# Patient Record
Sex: Female | Born: 1970 | Race: White | Hispanic: No | Marital: Married | Smoking: Former smoker
Health system: Southern US, Community
[De-identification: ages and names within clinical notes are randomized; demographics above are authoritative.]

## PROBLEM LIST (undated history)

## (undated) DIAGNOSIS — F419 Anxiety disorder, unspecified: Secondary | ICD-10-CM

## (undated) DIAGNOSIS — M810 Age-related osteoporosis without current pathological fracture: Secondary | ICD-10-CM

## (undated) DIAGNOSIS — F32A Depression, unspecified: Secondary | ICD-10-CM

## (undated) DIAGNOSIS — M1990 Unspecified osteoarthritis, unspecified site: Secondary | ICD-10-CM

## (undated) DIAGNOSIS — T7840XA Allergy, unspecified, initial encounter: Secondary | ICD-10-CM

## (undated) DIAGNOSIS — N809 Endometriosis, unspecified: Secondary | ICD-10-CM

## (undated) DIAGNOSIS — E079 Disorder of thyroid, unspecified: Secondary | ICD-10-CM

## (undated) DIAGNOSIS — D649 Anemia, unspecified: Secondary | ICD-10-CM

## (undated) HISTORY — DX: Age-related osteoporosis without current pathological fracture: M81.0

## (undated) HISTORY — DX: Allergy, unspecified, initial encounter: T78.40XA

## (undated) HISTORY — DX: Anxiety disorder, unspecified: F41.9

## (undated) HISTORY — DX: Anemia, unspecified: D64.9

## (undated) HISTORY — PX: TUBAL LIGATION: SHX77

## (undated) HISTORY — DX: Depression, unspecified: F32.A

## (undated) HISTORY — DX: Unspecified osteoarthritis, unspecified site: M19.90

## (undated) HISTORY — PX: LAPAROSCOPIC OOPHERECTOMY: SHX6507

## (undated) HISTORY — DX: Disorder of thyroid, unspecified: E07.9

---

## 1999-02-10 ENCOUNTER — Ambulatory Visit (HOSPITAL_COMMUNITY): Admission: RE | Admit: 1999-02-10 | Discharge: 1999-02-10 | Payer: Self-pay | Admitting: Obstetrics and Gynecology

## 1999-03-18 ENCOUNTER — Other Ambulatory Visit: Admission: RE | Admit: 1999-03-18 | Discharge: 1999-03-18 | Payer: Self-pay | Admitting: Obstetrics and Gynecology

## 2000-03-23 ENCOUNTER — Other Ambulatory Visit: Admission: RE | Admit: 2000-03-23 | Discharge: 2000-03-23 | Payer: Self-pay | Admitting: Obstetrics and Gynecology

## 2001-04-20 ENCOUNTER — Other Ambulatory Visit: Admission: RE | Admit: 2001-04-20 | Discharge: 2001-04-20 | Payer: Self-pay | Admitting: Obstetrics and Gynecology

## 2002-03-15 ENCOUNTER — Other Ambulatory Visit: Admission: RE | Admit: 2002-03-15 | Discharge: 2002-03-15 | Payer: Self-pay | Admitting: Gynecology

## 2002-04-27 ENCOUNTER — Inpatient Hospital Stay (HOSPITAL_COMMUNITY): Admission: AD | Admit: 2002-04-27 | Discharge: 2002-04-27 | Payer: Self-pay | Admitting: Gynecology

## 2003-03-08 ENCOUNTER — Other Ambulatory Visit: Admission: RE | Admit: 2003-03-08 | Discharge: 2003-03-08 | Payer: Self-pay | Admitting: Gynecology

## 2003-09-04 ENCOUNTER — Other Ambulatory Visit: Admission: RE | Admit: 2003-09-04 | Discharge: 2003-09-04 | Payer: Self-pay | Admitting: Obstetrics and Gynecology

## 2003-10-04 ENCOUNTER — Inpatient Hospital Stay (HOSPITAL_COMMUNITY): Admission: AD | Admit: 2003-10-04 | Discharge: 2003-10-04 | Payer: Self-pay | Admitting: Obstetrics and Gynecology

## 2003-12-20 ENCOUNTER — Ambulatory Visit (HOSPITAL_COMMUNITY): Admission: RE | Admit: 2003-12-20 | Discharge: 2003-12-20 | Payer: Self-pay | Admitting: Obstetrics and Gynecology

## 2004-03-27 ENCOUNTER — Inpatient Hospital Stay (HOSPITAL_COMMUNITY): Admission: AD | Admit: 2004-03-27 | Discharge: 2004-03-30 | Payer: Self-pay | Admitting: Obstetrics and Gynecology

## 2004-05-02 ENCOUNTER — Other Ambulatory Visit: Admission: RE | Admit: 2004-05-02 | Discharge: 2004-05-02 | Payer: Self-pay | Admitting: Obstetrics and Gynecology

## 2005-05-18 ENCOUNTER — Ambulatory Visit: Payer: Self-pay | Admitting: Internal Medicine

## 2005-06-23 ENCOUNTER — Other Ambulatory Visit: Admission: RE | Admit: 2005-06-23 | Discharge: 2005-06-23 | Payer: Self-pay | Admitting: Obstetrics and Gynecology

## 2007-06-16 ENCOUNTER — Encounter (INDEPENDENT_AMBULATORY_CARE_PROVIDER_SITE_OTHER): Payer: Self-pay

## 2007-06-20 DIAGNOSIS — F3289 Other specified depressive episodes: Secondary | ICD-10-CM | POA: Insufficient documentation

## 2007-06-20 DIAGNOSIS — F329 Major depressive disorder, single episode, unspecified: Secondary | ICD-10-CM

## 2007-06-20 DIAGNOSIS — G4486 Cervicogenic headache: Secondary | ICD-10-CM | POA: Insufficient documentation

## 2007-06-20 DIAGNOSIS — R51 Headache: Secondary | ICD-10-CM

## 2008-01-31 ENCOUNTER — Ambulatory Visit: Payer: Self-pay | Admitting: Internal Medicine

## 2008-01-31 DIAGNOSIS — S9030XA Contusion of unspecified foot, initial encounter: Secondary | ICD-10-CM | POA: Insufficient documentation

## 2008-08-27 ENCOUNTER — Ambulatory Visit: Payer: Self-pay | Admitting: Internal Medicine

## 2008-08-27 DIAGNOSIS — J069 Acute upper respiratory infection, unspecified: Secondary | ICD-10-CM | POA: Insufficient documentation

## 2008-09-18 ENCOUNTER — Telehealth: Payer: Self-pay | Admitting: Internal Medicine

## 2008-09-18 ENCOUNTER — Ambulatory Visit: Payer: Self-pay | Admitting: Internal Medicine

## 2008-09-25 ENCOUNTER — Telehealth: Payer: Self-pay | Admitting: Internal Medicine

## 2008-10-09 ENCOUNTER — Telehealth: Payer: Self-pay | Admitting: Internal Medicine

## 2010-09-01 ENCOUNTER — Encounter: Admission: RE | Admit: 2010-09-01 | Discharge: 2010-09-01 | Payer: Self-pay | Admitting: Obstetrics and Gynecology

## 2011-11-13 ENCOUNTER — Other Ambulatory Visit: Payer: Self-pay | Admitting: Obstetrics and Gynecology

## 2011-11-13 DIAGNOSIS — R928 Other abnormal and inconclusive findings on diagnostic imaging of breast: Secondary | ICD-10-CM

## 2011-11-24 ENCOUNTER — Encounter: Payer: Self-pay | Admitting: Internal Medicine

## 2011-11-24 ENCOUNTER — Ambulatory Visit (INDEPENDENT_AMBULATORY_CARE_PROVIDER_SITE_OTHER): Payer: BC Managed Care – PPO | Admitting: Internal Medicine

## 2011-11-24 VITALS — BP 120/80 | Temp 98.2°F | Ht 64.0 in | Wt 157.0 lb

## 2011-11-24 DIAGNOSIS — M7550 Bursitis of unspecified shoulder: Secondary | ICD-10-CM

## 2011-11-24 DIAGNOSIS — M67919 Unspecified disorder of synovium and tendon, unspecified shoulder: Secondary | ICD-10-CM

## 2011-11-24 NOTE — Progress Notes (Signed)
  Subjective:    Patient ID: Carrie [name], female    DOB: 1971/08/18, 41 y.o.   MRN: [id number]  HPI  41 year old patient who presents with a chief complaint of right shoulder pain. This has been present intermittently for approximately 5 months.  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 She has had pain in the right anterolateral shoulder region. Range of motion is fairly well maintained but she has some discomfort with movement.  Review of Systems  Musculoskeletal: Arthralgias: right shoulder pain.       Objective:   Physical Exam  Constitutional: She appears well-developed and well-nourished. No distress.  Musculoskeletal: Normal range of motion. She exhibits tenderness. She exhibits no edema.       Tenderness of the right anterolateral shoulder region. Range of motion intact          Assessment & Plan:    Chronic right shoulder bursitis.  The right shoulder bursa was injected with 1 cc of Xylocaine and 1 cc of Depo-Medrol after prep

## 2011-11-24 NOTE — Patient Instructions (Signed)
Take Aleve 200 mg  TWO  twice daily for pain or swelling  Call or return to clinic prn if these symptoms worsen or fail to improve as anticipated.  You  may move around, but avoid painful motions and activities.  Apply heat  to the sore area for 15 to 20 minutes 3 or 4 times daily for the next two to 3 days.

## 2011-11-25 ENCOUNTER — Ambulatory Visit
Admission: RE | Admit: 2011-11-25 | Discharge: 2011-11-25 | Disposition: A | Discharge status: Home / Self Care | Payer: BC Managed Care – PPO | Source: Ambulatory Visit | Attending: Obstetrics and Gynecology | Admitting: Obstetrics and Gynecology

## 2011-11-25 DIAGNOSIS — R928 Other abnormal and inconclusive findings on diagnostic imaging of breast: Secondary | ICD-10-CM

## 2012-09-14 ENCOUNTER — Other Ambulatory Visit: Payer: Self-pay | Admitting: Obstetrics and Gynecology

## 2012-09-14 DIAGNOSIS — D249 Benign neoplasm of unspecified breast: Secondary | ICD-10-CM

## 2012-09-19 ENCOUNTER — Ambulatory Visit
Admission: RE | Admit: 2012-09-19 | Discharge: 2012-09-19 | Disposition: A | Discharge status: Home / Self Care | Payer: BC Managed Care – PPO | Source: Ambulatory Visit | Attending: Obstetrics and Gynecology | Admitting: Obstetrics and Gynecology

## 2012-09-19 ENCOUNTER — Other Ambulatory Visit: Payer: Self-pay | Admitting: Obstetrics and Gynecology

## 2012-09-19 DIAGNOSIS — N63 Unspecified lump in unspecified breast: Secondary | ICD-10-CM

## 2012-09-19 DIAGNOSIS — D249 Benign neoplasm of unspecified breast: Secondary | ICD-10-CM

## 2012-11-07 ENCOUNTER — Ambulatory Visit
Admission: RE | Admit: 2012-11-07 | Discharge: 2012-11-07 | Disposition: A | Discharge status: Home / Self Care | Payer: BC Managed Care – PPO | Source: Ambulatory Visit | Attending: Obstetrics and Gynecology | Admitting: Obstetrics and Gynecology

## 2012-11-07 DIAGNOSIS — N63 Unspecified lump in unspecified breast: Secondary | ICD-10-CM

## 2012-11-10 ENCOUNTER — Other Ambulatory Visit: Payer: Self-pay | Admitting: Obstetrics and Gynecology

## 2012-11-10 DIAGNOSIS — R928 Other abnormal and inconclusive findings on diagnostic imaging of breast: Secondary | ICD-10-CM

## 2012-11-16 ENCOUNTER — Ambulatory Visit (INDEPENDENT_AMBULATORY_CARE_PROVIDER_SITE_OTHER): Payer: BC Managed Care – PPO | Admitting: Family Medicine

## 2012-11-16 ENCOUNTER — Encounter: Payer: Self-pay | Admitting: Family Medicine

## 2012-11-16 VITALS — BP 110/80 | Temp 98.7°F | Wt 161.0 lb

## 2012-11-16 DIAGNOSIS — B9789 Other viral agents as the cause of diseases classified elsewhere: Secondary | ICD-10-CM

## 2012-11-16 DIAGNOSIS — B349 Viral infection, unspecified: Secondary | ICD-10-CM

## 2012-11-16 NOTE — Progress Notes (Signed)
  Subjective:    Patient ID: Carrie [name], female    DOB: 02/13/1971, 42 y.o.   MRN: [id number]  HPI Acute visit  Onset Sunday of cough. By Monday had increased fatigue and diffuse body aches and temperature 100.2 Fever only lasted one day. Denies sore throat or nasal congestion Cough is dry. Occasional diffuse headaches. 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 No distress.  HENT:  Right Ear: External ear normal.  Left Ear: External ear normal.  Mouth/Throat: Oropharynx is clear and moist.  Neck: Neck supple. No thyromegaly present.  Cardiovascular: Normal rate and regular rhythm.   Pulmonary/Chest: Effort normal and breath sounds normal. No respiratory distress. She has no wheezes. She has no rales.  Lymphadenopathy:    She has no cervical adenopathy.  Skin: No rash noted.          Assessment & Plan:  Viral syndrome. Doubt influenza. Symptomatic measures. Work note written for 1-14 through 11/17/2012

## 2012-11-16 NOTE — Patient Instructions (Addendum)
Viral Syndrome  You or your child has Viral Syndrome. It is the most common infection causing "colds" and infections in the nose, throat, sinuses, and breathing tubes. Sometimes the infection causes nausea, vomiting, or diarrhea. The germ that causes the infection is a virus. No antibiotic or other medicine will kill it. There are medicines that you can take to make you or your child more comfortable.   HOME CARE INSTRUCTIONS    Rest in bed until you start to feel better.   If you have diarrhea or vomiting, eat small amounts of crackers and toast. Soup is helpful.   Do not give aspirin or medicine that contains aspirin to children.   Only take over-the-counter or prescription medicines for pain, discomfort, or fever as directed by your caregiver.  SEEK IMMEDIATE MEDICAL CARE IF:    You or your child has not improved within one week.   You or your child has pain that is not at least partially relieved by over-the-counter medicine.   Thick, colored mucus or blood is coughed up.   Discharge from the nose becomes thick yellow or green.   Diarrhea or vomiting gets worse.   There is any major change in your or your child's condition.   You or your child develops a skin rash, stiff neck, severe headache, or are unable to hold down food or fluid.   You or your child has an oral temperature above 102 F (38.9 C), not controlled by medicine.   Your baby is older than 3 months with a rectal temperature of 102 F (38.9 C) or higher.   Your baby is 3 months old or younger with a rectal temperature of 100.4 F (38 C) or higher.  Document Released: 10/04/2006 Document Revised: 01/11/2012 Document Reviewed: 10/05/2007  ExitCare Patient Information 2013 [employer].

## 2012-11-18 ENCOUNTER — Other Ambulatory Visit: Payer: BC Managed Care – PPO

## 2012-11-25 ENCOUNTER — Ambulatory Visit
Admission: RE | Admit: 2012-11-25 | Discharge: 2012-11-25 | Disposition: A | Discharge status: Home / Self Care | Payer: BC Managed Care – PPO | Source: Ambulatory Visit | Attending: Obstetrics and Gynecology | Admitting: Obstetrics and Gynecology

## 2012-11-25 DIAGNOSIS — R928 Other abnormal and inconclusive findings on diagnostic imaging of breast: Secondary | ICD-10-CM

## 2014-08-06 ENCOUNTER — Ambulatory Visit (INDEPENDENT_AMBULATORY_CARE_PROVIDER_SITE_OTHER): Payer: BC Managed Care – PPO | Admitting: Internal Medicine

## 2014-08-06 ENCOUNTER — Encounter: Payer: Self-pay | Admitting: Internal Medicine

## 2014-08-06 VITALS — BP 120/74 | HR 84 | Temp 98.2°F | Resp 20 | Ht 64.0 in | Wt 162.0 lb

## 2014-08-06 DIAGNOSIS — E039 Hypothyroidism, unspecified: Secondary | ICD-10-CM | POA: Insufficient documentation

## 2014-08-06 DIAGNOSIS — J069 Acute upper respiratory infection, unspecified: Secondary | ICD-10-CM

## 2014-08-06 DIAGNOSIS — E031 Congenital hypothyroidism without goiter: Secondary | ICD-10-CM

## 2014-08-06 MED ORDER — HYDROCODONE-HOMATROPINE 5-1.5 MG/5ML PO SYRP: 5.0000 mL | ORAL_SOLUTION | Freq: Four times a day (QID) | ORAL | Status: DC | PRN

## 2014-08-06 NOTE — Progress Notes (Signed)
Subjective:    Patient ID: Carrie [name], female    DOB: 06-Feb-1971, 43 y.o.   MRN: [id number]  HPI 43 year old patient, who presents with a 7 to ten-day history of chest congestion and cough.  She is improving, but still expectorating scant amounts of yellow sputum.  She has been using guaifenesin.  No fever or chills, chest pain or shortness of breath. She has a history of hypothyroidism on supplemental medications.  She is followed annually by gynecology, who checks lab every year Nonsmoker No significant exposure history  History reviewed. No pertinent past medical history.  History   Social History  . Marital Status: Married    Spouse Name: N/A    Number of Children: N/A  . Years of Education: N/A   Occupational History  . Not on file.   Social History Main Topics  . Smoking status: Former Research scientist (life sciences)  . Smokeless tobacco: Not on file  . Alcohol Use:   . Drug Use:   . Sexual Activity:    Other Topics Concern  . Not on file   Social History Narrative  . No narrative on file    History reviewed. No pertinent past surgical history.  No family history on file.  No Known Allergies  Current Outpatient Prescriptions on File Prior to Visit  Medication Sig Dispense Refill  . calcium carbonate 200 MG capsule Take 250 mg by mouth 2 (two) times daily with a meal.      . citalopram (CELEXA) 40 MG tablet Take 40 mg by mouth daily.      Marland Kitchen levothyroxine (SYNTHROID, LEVOTHROID) 88 MCG tablet Take 88 mcg by mouth daily.      . NON FORMULARY BCP      . Vitamin D, Ergocalciferol, (DRISDOL) 50000 UNITS CAPS Take 50,000 Units by mouth.       No current facility-administered medications on file prior to visit.    BP 120/74  Pulse 84  Temp(Src) 98.2 F (36.8 C) (Oral)  Resp 20  Ht 5\' 4"  (1.626 m)  Wt 162 lb (73.483 kg)  BMI 27.79 kg/m2  SpO2 97%      Review of Systems  Constitutional: Negative for fever, appetite change, fatigue and unexpected weight change.  HENT:  Negative for congestion, dental problem, ear pain, hearing loss, mouth sores, nosebleeds, sinus pressure, sore throat, tinnitus, trouble swallowing and voice change.   Eyes: Negative for photophobia, pain, redness and visual disturbance.  Respiratory: Positive for cough. Negative for chest tightness and shortness of breath.   Cardiovascular: Negative for chest pain, palpitations and leg swelling.  Gastrointestinal: Negative for nausea, vomiting, abdominal pain, diarrhea, constipation, blood in stool, abdominal distention and rectal pain.  Genitourinary: Negative for dysuria, urgency, frequency, hematuria, flank pain, vaginal bleeding, vaginal discharge, difficulty urinating, genital sores, vaginal pain, menstrual problem and pelvic pain.  Musculoskeletal: Negative for arthralgias, back pain and neck stiffness.  Skin: Negative for rash.  Neurological: Negative for dizziness, syncope, speech difficulty, weakness, light-headedness, numbness and headaches.  Hematological: Negative for adenopathy. Does not bruise/bleed easily.  Psychiatric/Behavioral: Negative for suicidal ideas, behavioral problems, self-injury, dysphoric mood and agitation. The patient is not nervous/anxious.        Objective:   Physical Exam  Constitutional: She is oriented to person, place, and time. She appears well-developed and well-nourished.  HENT:  Head: Normocephalic.  Right Ear: External ear normal.  Left Ear: External ear normal.  Mouth/Throat: Oropharynx is clear and moist.  Eyes: Conjunctivae and EOM are normal. Pupils  are equal, round, and reactive to light.  Neck: Normal range of motion. Neck supple. No thyromegaly present.  Cardiovascular: Normal rate, regular rhythm, normal heart sounds and intact distal pulses.   Pulmonary/Chest: Effort normal and breath sounds normal. No respiratory distress. She has no wheezes. She has no rales.  Abdominal: Soft. Bowel sounds are normal. She exhibits no mass. There is no  tenderness.  Musculoskeletal: Normal range of motion.  Lymphadenopathy:    She has no cervical adenopathy.  Neurological: She is alert and oriented to person, place, and time.  Skin: Skin is warm and dry. No rash noted.  Psychiatric: She has a normal mood and affect. Her behavior is normal.          Assessment & Plan:   Viral URI with cough.  Will treat symptomatically Hypothyroidism.  Continue levothyroxine and supplement

## 2014-08-06 NOTE — Progress Notes (Signed)
Pre visit review using our clinic review tool, if applicable. No additional management support is needed unless otherwise documented below in the visit note. 

## 2014-08-06 NOTE — Patient Instructions (Signed)
Acute bronchitis symptoms  are generally not helped by antibiotics.  Take over-the-counter expectorants and cough medications such as  Mucinex DM.  Call if there is no improvement in 5 to 7 days or if  you develop worsening cough, fever, or new symptoms, such as shortness of breath or chest pain.  Call or return to clinic prn if these symptoms worsen or fail to improve as anticipated.

## 2015-01-08 ENCOUNTER — Encounter: Payer: Self-pay | Admitting: Internal Medicine

## 2015-01-08 ENCOUNTER — Ambulatory Visit (INDEPENDENT_AMBULATORY_CARE_PROVIDER_SITE_OTHER): Payer: BC Managed Care – PPO | Admitting: Internal Medicine

## 2015-01-08 VITALS — BP 110/74 | HR 79 | Temp 98.6°F | Resp 20 | Ht 64.0 in | Wt 153.0 lb

## 2015-01-08 DIAGNOSIS — J069 Acute upper respiratory infection, unspecified: Secondary | ICD-10-CM

## 2015-01-08 DIAGNOSIS — E039 Hypothyroidism, unspecified: Secondary | ICD-10-CM

## 2015-01-08 MED ORDER — HYDROCODONE-HOMATROPINE 5-1.5 MG/5ML PO SYRP: 5.0000 mL | ORAL_SOLUTION | Freq: Four times a day (QID) | ORAL | Status: DC | PRN

## 2015-01-08 NOTE — Patient Instructions (Signed)
Acute bronchitis symptoms for less than 10 days are generally not helped by antibiotics.  Take over-the-counter expectorants and cough medications such as  Mucinex DM.  Call if there is no improvement in 5 to 7 days or if  you develop worsening cough, fever, or new symptoms, such as shortness of breath or chest pain.  TREATMENT  Acute bronchitis usually goes away in a couple weeks. Oftentimes, no medical treatment is necessary. Medicines are sometimes given for relief of fever or cough. Antibiotic medicines are usually not needed but may be prescribed in certain situations. In some cases, an inhaler may be recommended to help reduce shortness of breath and control the cough. A cool mist vaporizer may also be used to help thin bronchial secretions and make it easier to clear the chest.  HOME CARE INSTRUCTIONS  Get plenty of rest.  Drink enough fluids to keep your urine clear or pale yellow (unless you have a medical condition that requires fluid restriction). Increasing fluids may help thin your respiratory secretions (sputum) and reduce chest congestion, and it will prevent dehydration.  Take medicines only as directed by your health care provider.   Avoid smoking and secondhand smoke. Exposure to cigarette smoke or irritating chemicals will make bronchitis worse. If you are a smoker, consider using nicotine gum or skin patches to help control withdrawal symptoms. Quitting smoking will help your lungs heal faster.  Reduce the chances of another bout of acute bronchitis by washing your hands frequently, avoiding people with cold symptoms, and trying not to touch your hands to your mouth, nose, or eyes.

## 2015-01-08 NOTE — Progress Notes (Signed)
Pre visit review using our clinic review tool, if applicable. No additional management support is needed unless otherwise documented below in the visit note. 

## 2015-01-08 NOTE — Progress Notes (Signed)
Subjective:    Patient ID: Carrie [name], female    DOB: [birth year], 44 y.o.   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  No fever She has a history of hypothyroidism.  She was seen by OB/GYN last month and thyroid studies were checked She has a history depression and continues to do well on Celexa  No past medical history on file.  History   Social History  . Marital Status: Married    Spouse Name: N/A  . Number of Children: N/A  . Years of Education: N/A   Occupational History  . Not on file.   Social History Main Topics  . Smoking status: Former Research scientist (life sciences)  . Smokeless tobacco: Not on file  . Alcohol Use: Not on file  . Drug Use: Not on file  . Sexual Activity: Not on file   Other Topics Concern  . Not on file   Social History Narrative    No past surgical history on file.  No family history on file.  No Known Allergies  Current Outpatient Prescriptions on File Prior to Visit  Medication Sig Dispense Refill  . calcium carbonate 200 MG capsule Take 250 mg by mouth 2 (two) times daily with a meal.    . citalopram (CELEXA) 40 MG tablet Take 40 mg by mouth daily.    Marland Kitchen levothyroxine (SYNTHROID, LEVOTHROID) 88 MCG tablet Take 88 mcg by mouth daily.    . NON FORMULARY BCP    . Vitamin D, Ergocalciferol, (DRISDOL) 50000 UNITS CAPS Take 50,000 Units by mouth.    Marland Kitchen HYDROcodone-homatropine (HYCODAN) 5-1.5 MG/5ML syrup Take 5 mLs by mouth every 6 (six) hours as needed for cough. (Patient not taking: Reported on 01/08/2015) 120 mL 0   No current facility-administered medications on file prior to visit.    BP 110/74 mmHg  Pulse 79  Temp(Src) 98.6 F (37 C) (Oral)  Resp 20  Ht 5\' 4"  (1.626 m)  Wt 153 lb (69.4 kg)  BMI 26.25 kg/m2  SpO2 98%      Review of Systems  Constitutional: Positive for activity change, appetite change and fatigue.    HENT: Negative for congestion, dental problem, hearing loss, rhinorrhea, sinus pressure, sore throat and tinnitus.   Eyes: Negative for pain, discharge and visual disturbance.  Respiratory: Negative for cough and shortness of breath.   Cardiovascular: Negative for chest pain, palpitations and leg swelling.  Gastrointestinal: Negative for nausea, vomiting, abdominal pain, diarrhea, constipation, blood in stool and abdominal distention.  Genitourinary: Negative for dysuria, urgency, frequency, hematuria, flank pain, vaginal bleeding, vaginal discharge, difficulty urinating, vaginal pain and pelvic pain.  Musculoskeletal: Positive for myalgias. Negative for joint swelling, arthralgias and gait problem.  Skin: Negative for rash.  Neurological: Positive for weakness. Negative for dizziness, syncope, speech difficulty, numbness and headaches.  Hematological: Negative for adenopathy.  Psychiatric/Behavioral: Negative for behavioral problems, dysphoric mood and agitation. The patient is not nervous/anxious.        Objective:   Physical Exam  Constitutional: She is oriented to person, place, and time. She appears well-developed and well-nourished.  HENT:  Head: Normocephalic.  Right Ear: External ear normal.  Left Ear: External ear normal.  Mouth/Throat: Oropharynx is clear and moist.  Eyes: Conjunctivae and EOM are normal. Pupils are equal, round, and reactive to light.  Neck: Normal range of motion. Neck supple. No thyromegaly present.  Cardiovascular:  Normal rate, regular rhythm, normal heart sounds and intact distal pulses.   Pulmonary/Chest: Effort normal and breath sounds normal.  Abdominal: Soft. Bowel sounds are normal. She exhibits no mass. There is no tenderness.  Musculoskeletal: Normal range of motion.  Lymphadenopathy:    She has no cervical adenopathy.  Neurological: She is alert and oriented to person, place, and time.  Skin: Skin is warm and dry. No rash noted.  Psychiatric:  She has a normal mood and affect. Her behavior is normal.          Assessment & Plan:   Viral URI with cough.  Will treat symptomatically. Patient instructions discussed in dispensed  A note to be at work until 01/10/15 dictated

## 2015-06-10 ENCOUNTER — Ambulatory Visit (INDEPENDENT_AMBULATORY_CARE_PROVIDER_SITE_OTHER): Payer: BC Managed Care – PPO | Admitting: Internal Medicine

## 2015-06-10 ENCOUNTER — Encounter: Payer: Self-pay | Admitting: Internal Medicine

## 2015-06-10 VITALS — BP 110/70 | HR 88 | Temp 98.6°F | Resp 20 | Ht 64.0 in | Wt 141.0 lb

## 2015-06-10 DIAGNOSIS — R1013 Epigastric pain: Secondary | ICD-10-CM | POA: Diagnosis not present

## 2015-06-10 DIAGNOSIS — E039 Hypothyroidism, unspecified: Secondary | ICD-10-CM | POA: Diagnosis not present

## 2015-06-10 LAB — CBC WITH DIFFERENTIAL/PLATELET
BASOS ABS: 0 10*3/uL (ref 0.0–0.1)
BASOS PCT: 0.2 % (ref 0.0–3.0)
EOS PCT: 0.2 % (ref 0.0–5.0)
Eosinophils Absolute: 0 10*3/uL (ref 0.0–0.7)
HCT: 42.7 % (ref 36.0–46.0)
Hemoglobin: 14.1 g/dL (ref 12.0–15.0)
Lymphocytes Relative: 20.6 % (ref 12.0–46.0)
Lymphs Abs: 2.1 10*3/uL (ref 0.7–4.0)
MCHC: 33.2 g/dL (ref 30.0–36.0)
MCV: 92.4 fl (ref 78.0–100.0)
MONOS PCT: 5 % (ref 3.0–12.0)
Monocytes Absolute: 0.5 10*3/uL (ref 0.1–1.0)
NEUTROS ABS: 7.6 10*3/uL (ref 1.4–7.7)
NEUTROS PCT: 74 % (ref 43.0–77.0)
Platelets: 274 10*3/uL (ref 150.0–400.0)
RBC: 4.61 Mil/uL (ref 3.87–5.11)
RDW: 13.9 % (ref 11.5–15.5)
WBC: 10.3 10*3/uL (ref 4.0–10.5)

## 2015-06-10 LAB — COMPREHENSIVE METABOLIC PANEL
ALK PHOS: 47 U/L (ref 39–117)
ALT: 9 U/L (ref 0–35)
AST: 12 U/L (ref 0–37)
Albumin: 4.4 g/dL (ref 3.5–5.2)
BILIRUBIN TOTAL: 0.7 mg/dL (ref 0.2–1.2)
BUN: 11 mg/dL (ref 6–23)
CALCIUM: 9.7 mg/dL (ref 8.4–10.5)
CHLORIDE: 103 meq/L (ref 96–112)
CO2: 24 mEq/L (ref 19–32)
CREATININE: 0.77 mg/dL (ref 0.40–1.20)
GFR: 85.72 mL/min (ref >60.00)
Glucose, Bld: 99 mg/dL (ref 70–99)
POTASSIUM: 3.8 meq/L (ref 3.5–5.1)
Sodium: 137 mEq/L (ref 135–145)
Total Protein: 7.3 g/dL (ref 6.0–8.3)

## 2015-06-10 LAB — AMYLASE: Amylase: 21 U/L — ABNORMAL LOW (ref 27–131)

## 2015-06-10 MED ORDER — PANTOPRAZOLE SODIUM 40 MG PO TBEC: 40.0000 mg | DELAYED_RELEASE_TABLET | Freq: Every day | ORAL | Status: DC

## 2015-06-10 MED ORDER — ONDANSETRON HCL 8 MG PO TABS: 8.0000 mg | ORAL_TABLET | Freq: Three times a day (TID) | ORAL | Status: DC | PRN

## 2015-06-10 NOTE — Progress Notes (Signed)
Pre visit review using our clinic review tool, if applicable. No additional management support is needed unless otherwise documented below in the visit note. 

## 2015-06-10 NOTE — Patient Instructions (Addendum)
Drink clear liquids only for the next 24 hours,  then slowly add other liquids and food as you  tolerate them  Report any new or worsening symptoms

## 2015-06-10 NOTE — Progress Notes (Signed)
Subjective:    Patient ID: Carrie [name], female    DOB: [birth year], 44 y.o.   MRN: [id number]  HPI 44 year old patient who presents today with a chief complaint of abdominal pain of 4 days' duration.  Pain is in the epigastric area.  Pain is alleviated by rest and seems be aggravated by activity.  She has nausea but no vomiting.  For the past 4-5 days, she has also had some loose bowel movements with a frequency of 4-5 per 24 hours.  No fever.  Her husband has some mild epigastric pain that resolved quickly  No past medical history on file.  History   Social History  . Marital Status: Married    Spouse Name: N/A  . Number of Children: N/A  . Years of Education: N/A   Occupational History  . Not on file.   Social History Main Topics  . Smoking status: Former Research scientist (life sciences)  . Smokeless tobacco: Not on file  . Alcohol Use: Not on file  . Drug Use: Not on file  . Sexual Activity: Not on file   Other Topics Concern  . Not on file   Social History Narrative    No past surgical history on file.  No family history on file.  No Known Allergies  Current Outpatient Prescriptions on File Prior to Visit  Medication Sig Dispense Refill  . citalopram (CELEXA) 40 MG tablet Take 40 mg by mouth daily.     No current facility-administered medications on file prior to visit.    BP 110/70 mmHg  Pulse 88  Temp(Src) 98.6 F (37 C) (Oral)  Resp 20  Ht 5\' 4"  (1.626 m)  Wt 141 lb (63.957 kg)  BMI 24.19 kg/m2  SpO2 99%      Review of Systems  Constitutional: Positive for activity change, appetite change and fatigue.  HENT: Negative for congestion, dental problem, hearing loss, rhinorrhea, sinus pressure, sore throat and tinnitus.   Eyes: Negative for pain, discharge and visual disturbance.  Respiratory: Negative for cough and shortness of breath.   Cardiovascular: Negative for chest pain, palpitations and leg swelling.  Gastrointestinal: Positive for nausea, abdominal pain and  diarrhea. Negative for vomiting, constipation, blood in stool and abdominal distention.  Genitourinary: Negative for dysuria, urgency, frequency, hematuria, flank pain, vaginal bleeding, vaginal discharge, difficulty urinating, vaginal pain and pelvic pain.  Musculoskeletal: Negative for joint swelling, arthralgias and gait problem.  Skin: Negative for rash.  Neurological: Negative for dizziness, syncope, speech difficulty, weakness, numbness and headaches.  Hematological: Negative for adenopathy.  Psychiatric/Behavioral: Negative for behavioral problems, dysphoric mood and agitation. The patient is not nervous/anxious.        Objective:   Physical Exam  Constitutional: She is oriented to person, place, and time. She appears well-developed and well-nourished.  HENT:  Head: Normocephalic.  Right Ear: External ear normal.  Left Ear: External ear normal.  Mouth/Throat: Oropharynx is clear and moist.  Well-hydrated  Eyes: Conjunctivae and EOM are normal. Pupils are equal, round, and reactive to light.  Neck: Normal range of motion. Neck supple. No thyromegaly present.  Cardiovascular: Normal rate, regular rhythm, normal heart sounds and intact distal pulses.   No tachycardia  Pulmonary/Chest: Effort normal and breath sounds normal.  Abdominal: Soft. Bowel sounds are normal. She exhibits no mass. There is tenderness.  Epigastric tenderness without guarding or rebound Bowel sounds normal.  No distention   Musculoskeletal: Normal range of motion.  Lymphadenopathy:    She has no cervical adenopathy.  Neurological: She is alert and oriented to person, place, and time.  Skin: Skin is warm and dry. No rash noted.  Psychiatric: She has a normal mood and affect. Her behavior is normal.          Assessment & Plan:   Epigastric pain of 4 days' duration.  Will treat with PPI therapy.  Clear liquid diet, and anti-medics.  Will advance diet as tolerated We'll check screening lab.  Will  report any new or worsening symptoms

## 2017-01-18 ENCOUNTER — Ambulatory Visit (INDEPENDENT_AMBULATORY_CARE_PROVIDER_SITE_OTHER): Payer: BC Managed Care – PPO | Admitting: Internal Medicine

## 2017-01-18 ENCOUNTER — Encounter: Payer: Self-pay | Admitting: Internal Medicine

## 2017-01-18 ENCOUNTER — Ambulatory Visit (INDEPENDENT_AMBULATORY_CARE_PROVIDER_SITE_OTHER)
Admission: RE | Admit: 2017-01-18 | Discharge: 2017-01-18 | Disposition: A | Discharge status: Home / Self Care | Payer: BC Managed Care – PPO | Source: Ambulatory Visit | Attending: Internal Medicine | Admitting: Internal Medicine

## 2017-01-18 VITALS — BP 120/80 | HR 80 | Temp 98.1°F | Ht 64.0 in | Wt 170.4 lb

## 2017-01-18 DIAGNOSIS — M25552 Pain in left hip: Secondary | ICD-10-CM

## 2017-01-18 DIAGNOSIS — Z9181 History of falling: Secondary | ICD-10-CM

## 2017-01-18 MED ORDER — METHOCARBAMOL 500 MG PO TABS: 1000.0000 mg | ORAL_TABLET | Freq: Every day | ORAL | 0 refills | Status: DC

## 2017-01-18 MED ORDER — NAPROXEN 500 MG PO TABS: 500.0000 mg | ORAL_TABLET | Freq: Two times a day (BID) | ORAL | 0 refills | Status: DC

## 2017-01-18 NOTE — Progress Notes (Signed)
Chief Complaint  Patient presents with  . Hip Pain    left hip/ started three weeks/ from a fall     HPI: Carrie [name] 46 y.o.  sda    PCP not inoffice  Onset about 3 weeks ago when she was trying to walk over a 24 inch dog gate back foot caught and she fell against her left hip left shoulder and her head hit the wall. No loss of consciousness had some bruising her head and her shoulder no longer an issue but she is having increasing pain on the left hip buttock lateral with occasional radiation down the lateral thigh to the knee. Is worse when she lays on it sits on it but also walking after sitting for a while light is quite stiff. She used ice early on the no previous injury. No neurologic symptoms no groin pain.  Has taken ibuprofen without a lot of help at this time  States it is hard for her lift her leg to go up steps. ROS: See pertinent positives and negatives per HPI. 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  No family history on file.  Social History   Social History  . Marital status: Married    Spouse name: N/A  . Number of children: N/A  . Years of education: N/A   Social History Main Topics  . Smoking status: Former Research scientist (life sciences)  . Smokeless tobacco: Never Used  . Alcohol use None  . Drug use: Unknown  . Sexual activity: Not Asked   Other Topics Concern  . None   Social History Narrative  . None    Outpatient Medications Prior to Visit  Medication Sig Dispense Refill  . ALPRAZolam (XANAX) 0.25 MG tablet Take 0.25 mg by mouth 3 (three) times daily as needed for anxiety.    . Calcium Citrate-Vitamin D (CALCIUM + D PO) Take by mouth 2 (two) times daily. Calcium 1,000 mg and Vit D 3 100 IU    . citalopram (CELEXA) 40 MG tablet Take 40 mg by mouth daily.    Marland Kitchen levothyroxine (SYNTHROID, LEVOTHROID) 100 MCG tablet Take 100 mcg by mouth daily before breakfast.    . Norethin Ace-Eth Estrad-FE (GILDESS FE 1/20 PO) Take 1  tablet by mouth daily.    Marland Kitchen zolpidem (AMBIEN) 10 MG tablet Take 10 mg by mouth at bedtime as needed for sleep.    Marland Kitchen ondansetron (ZOFRAN) 8 MG tablet Take 1 tablet (8 mg total) by mouth every 8 (eight) hours as needed for nausea or vomiting. 20 tablet 0  . pantoprazole (PROTONIX) 40 MG tablet Take 1 tablet (40 mg total) by mouth daily. 30 tablet 3   No facility-administered medications prior to visit.      EXAM:  BP 120/80 (BP Location: Left Arm, Patient Position: Sitting, Cuff Size: Normal)   Pulse 80   Temp 98.1 F (36.7 C) (Oral)   Ht 5\' 4"  (1.626 m)   Wt 170 lb 6.4 oz (77.3 kg)   BMI 29.25 kg/m   Body mass index is 29.25 kg/m.  GENERAL: vitals reviewed and listed above, alert, oriented, appears well hydrated and in no acute distress MS: moves all extremities   Back no midline tenderness  No flank pain   Tender left lateral hip buttocks area  Neg si joint  Hip rom ok but some discomfort eioth internal rotaion abduction   Gait mildly antalgic tender  Below left pelvic bone .  PSYCH: pleasant and cooperative, no obvious depression or anxiety  ASSESSMENT AND PLAN:  Discussed the following assessment and plan:  Left hip pain - Plan: DG HIP UNILAT WITH PELVIS 2-3 VIEWS LEFT, Ambulatory referral to Sports Medicine  Hx of fall - Plan: DG HIP UNILAT WITH PELVIS 2-3 VIEWS LEFT, Ambulatory referral to Sports Medicine  -Patient advised to return or notify health care team  if symptoms worsen ,persist or new concerns arise.  Patient Instructions  This may be a contusion and soft tissue injury however should be getting better even though steps Check x-ray today changed to naproxen and mild muscle relaxer at night. I would continue to ice. You'll be contacted about an appointment with Dr. Tamala Julian sports medicine as we discussed. Let us know if worsening before then.    [name]. Beckett Maden M.D.

## 2017-01-18 NOTE — Patient Instructions (Signed)
This may be a contusion and soft tissue injury however should be getting better even though steps Check x-ray today changed to naproxen and mild muscle relaxer at night. I would continue to ice. You'll be contacted about an appointment with Dr. Tamala Julian sports medicine as we discussed. Let us know if worsening before then.

## 2017-02-01 NOTE — Progress Notes (Signed)
[name] [place], [place] [address] Phone: [phone] Subjective:    I'm seeing this patient by the request  of:  Nyoka Cowden, MD   CC: Left hip pain  [id number]  Carrie [name] is a 46 y.o. female coming in with complaint of left hip pain.  Patient states that this started approximately 4 weeks ago. Patient was walking and got foot, she fell against a fence. Patient did even hit her head and shoulder. Patient states that unfortunately continues to have pain mostly in the left hip and buttock area mostly on the lateral aspect. Some mild radiation going on the lateral aspect of the thigh to the knee. Worse when she lays on that side or sits for long amount of time. Has tried ice and over-the-counter medications with mild improvement.    Impression x-rays were taken previously. Hip x-rays taken 01/18/2017 were independently visualized by me showing no significant bony normality. No past medical history on file. No past surgical history on file. Social History   Social History  . Marital status: Married    Spouse name: N/A  . Number of children: N/A  . Years of education: N/A   Social History Main Topics  . Smoking status: Former Research scientist (life sciences)  . Smokeless tobacco: Never Used  . Alcohol use None  . Drug use: Unknown  . Sexual activity: Not Asked   Other Topics Concern  . None   Social History Narrative  . None   No Known Allergies No family history on file.  Past medical history, social, surgical and family history all reviewed in electronic medical record.  No pertanent information unless stated regarding to the chief complaint.   Review of Systems:Review of systems updated and as accurate as of 02/02/17  No headache, visual changes, nausea, vomiting, diarrhea, constipation, dizziness, abdominal pain, skin rash, fevers, chills, night sweats, weight loss, swollen lymph nodes, body aches, joint swelling, muscle aches,  chest pain, shortness of breath, mood changes.   Objective  Blood pressure 112/80, pulse 74, height 5\' 4"  (1.626 m), weight 168 lb (76.2 kg), last menstrual period 01/11/2017. Systems examined below as of 02/02/17   General: No apparent distress alert and oriented x3 mood and affect normal, dressed appropriately.  HEENT: Pupils equal, extraocular movements intact  Respiratory: Patient's speak in full sentences and does not appear short of breath  Cardiovascular: No lower extremity edema, non tender, no erythema  Skin: Warm dry intact with no signs of infection or rash on extremities or on axial skeleton.  Abdomen: Soft nontender  Neuro: Cranial nerves II through XII are intact, neurovascularly intact in all extremities with 2+ DTRs and 2+ pulses.  Lymph: No lymphadenopathy of posterior or anterior cervical chain or axillae bilaterally.  Gait normal with good balance and coordination.  MSK:  Non tender with full range of motion and good stability and symmetric strength and tone of shoulders, elbows, wrist,  knee and ankles bilaterally.  BHA:LPFX ROM IR: 25 Deg, ER: 45 Deg, Flexion: 120 Deg, Extension: 100 Deg, Abduction: 45 Deg, Adduction: 45 Deg Strength IR: 5/5, ER: 5/5, Flexion: 5/5, Extension: 5/5, Abduction: 5/5, Adduction: 5/5 Pelvic alignment unremarkable to inspection and palpation. Standing hip rotation and gait without trendelenburg sign / unsteadiness. Greater trochanter without tenderness to palpation. No tenderness over piriformis and greater trochanter. Positive Faber Mild positive straight leg test with mild pain over the sacroiliac joint on the left side Lateral hip unremarkable  Procedure note 97110;  15 minutes spent for Therapeutic exercises as stated in above notes.  This included exercises focusing on stretching, strengthening, with significant focus on eccentric aspects.Piriformis Syndrome  Using an anatomical model, reviewed with the patient the structures  involved and how they related to diagnosis. The patient indicated understanding.   The patient was given a handout from Dr. Arne Cleveland book "The Sports Medicine Patient Advisor" describing the anatomy and rehabilitation of the following condition: Piriformis Syndrome  Also given a handout with more extensive Piriformis stretching, hip flexor and abductor strengthening, ham stretching  Rec deep massage, explained self-massage with ball    Proper technique shown and discussed handout in great detail with ATC.  All questions were discussed and answered.     Impression and Recommendations:     This case required medical decision making of moderate complexity.      Note: This dictation was prepared with Dragon dictation along with smaller phrase technology. Any transcriptional errors that result from this process are unintentional.

## 2017-02-02 ENCOUNTER — Ambulatory Visit (INDEPENDENT_AMBULATORY_CARE_PROVIDER_SITE_OTHER): Payer: BC Managed Care – PPO | Admitting: Family Medicine

## 2017-02-02 ENCOUNTER — Encounter: Payer: Self-pay | Admitting: Family Medicine

## 2017-02-02 DIAGNOSIS — G5702 Lesion of sciatic nerve, left lower limb: Secondary | ICD-10-CM | POA: Diagnosis not present

## 2017-02-02 MED ORDER — GABAPENTIN 100 MG PO CAPS: 200.0000 mg | ORAL_CAPSULE | Freq: Every day | ORAL | 3 refills | Status: DC

## 2017-02-02 MED ORDER — PREDNISONE 50 MG PO TABS
50.0000 mg | ORAL_TABLET | Freq: Every day | ORAL | 0 refills | Status: DC
Start: 2017-02-02 — End: 2017-02-17

## 2017-02-02 NOTE — Assessment & Plan Note (Signed)
Liquid more of a piriformis syndrome. Possible gluteal contusion noted. Patient is having some tightness with a straight leg test. Possible small nerve irritation as well. Patient will start him prednisone and gabapentin. Home exercises given. We discussed compression and the area as well. Follow-up again in 10-14 days to make sure patient is improving.

## 2017-02-02 NOTE — Patient Instructions (Signed)
God to see you  Ice 20 minutes 2 times daily. Usually after activity and before bed. Tennis ball in back left pocket with sitting long amount of time.  Prednisone daily for 5 days  Gabapentin 200mg  at night and avoid ambien at first and see if this is enough to help you sleep.  Exercises 3 times a week.  See me again in 10-14 days and make sure you are doing better.

## 2017-02-05 ENCOUNTER — Other Ambulatory Visit: Payer: Self-pay | Admitting: Obstetrics and Gynecology

## 2017-02-05 DIAGNOSIS — R928 Other abnormal and inconclusive findings on diagnostic imaging of breast: Secondary | ICD-10-CM

## 2017-02-10 ENCOUNTER — Other Ambulatory Visit: Payer: Self-pay | Admitting: Internal Medicine

## 2017-02-11 NOTE — Telephone Encounter (Signed)
See below

## 2017-02-11 NOTE — Telephone Encounter (Signed)
Please have  Dr Raliegh Ip  Her PCPor Dr Tamala Julian who has seen her for the problem take over the medication  refilll s

## 2017-02-12 ENCOUNTER — Ambulatory Visit
Admission: RE | Admit: 2017-02-12 | Discharge: 2017-02-12 | Disposition: A | Discharge status: Home / Self Care | Payer: BC Managed Care – PPO | Source: Ambulatory Visit | Attending: Obstetrics and Gynecology | Admitting: Obstetrics and Gynecology

## 2017-02-12 ENCOUNTER — Encounter: Payer: Self-pay | Admitting: Radiology

## 2017-02-12 DIAGNOSIS — R928 Other abnormal and inconclusive findings on diagnostic imaging of breast: Secondary | ICD-10-CM

## 2017-02-17 ENCOUNTER — Ambulatory Visit (INDEPENDENT_AMBULATORY_CARE_PROVIDER_SITE_OTHER): Payer: BC Managed Care – PPO | Admitting: Family Medicine

## 2017-02-17 ENCOUNTER — Encounter: Payer: Self-pay | Admitting: Family Medicine

## 2017-02-17 DIAGNOSIS — M84353A Stress fracture, unspecified femur, initial encounter for fracture: Secondary | ICD-10-CM | POA: Insufficient documentation

## 2017-02-17 DIAGNOSIS — M84352A Stress fracture, left femur, initial encounter for fracture: Secondary | ICD-10-CM | POA: Diagnosis not present

## 2017-02-17 MED ORDER — VITAMIN D (ERGOCALCIFEROL) 1.25 MG (50000 UNIT) PO CAPS: 50000.0000 [IU] | ORAL_CAPSULE | ORAL | 0 refills | Status: DC

## 2017-02-17 NOTE — Progress Notes (Signed)
Pre-visit discussion using our clinic review tool. No additional management support is needed unless otherwise documented below in the visit note.  

## 2017-02-17 NOTE — Patient Instructions (Signed)
Good to see you  I think you have a stress fracture.  Once weekly vitamin D for next 12 weeks.  Thigh compression sleeve would help daily  Good shoes ;) See me again in 3-4 weeks.

## 2017-02-17 NOTE — Progress Notes (Signed)
  Carrie [name] [name], [name] [address] Phone: [phone] Subjective:    I'm seeing this patient by the request  of:  Carrie Cowden, MD   CC: Left hip pain f/u [id number]  Carrie [name] is a 46 y.o. female coming in with complaint of left hip pain.   This happened after a fall. Patient continues to have pain. Patient states that when she was on the prednisone was significant better. States that now seems to be a little bit more localized. Seems to go from the posterior buttocks region [place] now more to the thigh itself. Patient denies any associated back pain. Denies any significant radiation down the line. Patient states though that he can hurt with and without activity.   Impression x-rays were taken previously. Hip x-rays taken 01/18/2017 were independently visualized by me showing no significant bony normality. No past medical history on file. No past surgical history on file. Social History   Social History  . Marital status: Married    Spouse name: N/A  . Number of children: N/A  . Years of education: N/A   Social History Main Topics  . Smoking status: Former Research scientist (life sciences)  . Smokeless tobacco: Never Used  . Alcohol use None  . Drug use: Unknown  . Sexual activity: Not Asked   Other Topics Concern  . None   Social History Narrative  . None   No Known Allergies No family history on file. Family history of rheumatological diseases.  Past medical history, social, surgical and family history all reviewed in electronic medical record.  No pertanent information unless stated regarding to the chief complaint.   Review of Systems: No headache, visual changes, nausea, vomiting, diarrhea, constipation, dizziness, abdominal pain, skin rash, fevers, chills, night sweats, weight loss, swollen lymph nodes, body aches, joint swelling, muscle aches, chest pain, shortness of breath, mood changes.    Objective  Blood pressure  114/78, pulse 72, resp. rate 16, weight 174 lb 4 oz (79 kg), last menstrual period 02/12/2017, SpO2 98 %.   Systems examined below as of 02/17/17 General: NAD A&O x3 mood, affect normal  HEENT: Pupils equal, extraocular movements intact no nystagmus Respiratory: not short of breath at rest or with speaking Cardiovascular: No lower extremity edema, non tender Skin: Warm dry intact with no signs of infection or rash on extremities or on axial skeleton. Abdomen: Soft nontender, no masses Neuro: Cranial nerves  intact, neurovascularly intact in all extremities with 2+ DTRs and 2+ pulses. Lymph: No lymphadenopathy appreciated today  Gait normal with good balance and coordination.  MSK: Non tender with full range of motion and good stability and symmetric strength and tone of shoulders, elbows, wrist,  knee and ankles bilaterally.   FTD:DUKG ROM IR: 25 Deg, ER: 45 Deg, Flexion: 120 Deg, Extension: 100 Deg, Abduction: 45 Deg, Adduction: 45 Deg Strength IR: 5/5, ER: 5/5, Flexion: 5/5, Extension: 5/5, Abduction: 5/5, Adduction: 5/5 Positive fulcrum test today. This is new. Patient has negative pain with compression though. Mild discomfort still with Corky Sox but now more adductor region       Impression and Recommendations:     This case required medical decision making of moderate complexity.      Note: This dictation was prepared with Dragon dictation along with smaller phrase technology. Any transcriptional errors that result from this process are unintentional.

## 2017-02-17 NOTE — Assessment & Plan Note (Signed)
Patient now with Carrie [name] responded to the prednisone as well as a positive fulcrum today to have more of a stress reaction. Patient will be started on once weekly vitamin D, compression, icing regimen. Patient come back again in 3 weeks for further evaluation and treatment.

## 2017-02-18 ENCOUNTER — Telehealth: Payer: Self-pay | Admitting: Internal Medicine

## 2017-02-18 NOTE — Telephone Encounter (Signed)
Spoke with patient. She found a brace that had a thigh support with an additional hip support band. She wanted to know if that would be ok. Told patient that as long as the brace had the thigh portion that it would be fine for the additional piece that wrapped around her waist.

## 2017-02-18 NOTE — Telephone Encounter (Signed)
Pt called wanting a call back about a theigh support sleeve.

## 2017-03-03 ENCOUNTER — Other Ambulatory Visit: Payer: Self-pay | Admitting: Internal Medicine

## 2017-03-17 ENCOUNTER — Encounter: Payer: Self-pay | Admitting: Family Medicine

## 2017-03-17 ENCOUNTER — Ambulatory Visit: Payer: Self-pay

## 2017-03-17 ENCOUNTER — Ambulatory Visit (INDEPENDENT_AMBULATORY_CARE_PROVIDER_SITE_OTHER): Payer: BC Managed Care – PPO | Admitting: Family Medicine

## 2017-03-17 VITALS — BP 110/80 | HR 97 | Ht 64.0 in | Wt 174.0 lb

## 2017-03-17 DIAGNOSIS — M84351S Stress fracture, right femur, sequela: Secondary | ICD-10-CM

## 2017-03-17 DIAGNOSIS — M84352S Stress fracture, left femur, sequela: Secondary | ICD-10-CM

## 2017-03-17 NOTE — Progress Notes (Signed)
[name] [place], [place] [address] Phone: [phone] Subjective:    I'm seeing this patient by the request  of:  [name], MD   CC: Left hip pain f/u [id number]  Carrie [name] is a 46 y.o. female coming in with complaint of left hip pain.   This happened after a fall. Patient continues to have pain. Patient states that when she was on the prednisone was significant better. Patient said having some worsening pain again. Has been treated for more of a stress fracture of the hip itself. Patient states no significant improvement at this time. Patient states that if anything maybe some worsening. Pain seems to be deep. Still tender somewhat with activity but seems to be even worse at rest as well as at night. Denies any associated back pain. Feels more the pain now posteriorly but still has a radiating into the groin area. Affecting many daily activities   Impression x-rays were taken previously. Hip x-rays taken 01/18/2017 were independently visualized by me showing no significant bony normality. No past medical history on file. No past surgical history on file. Social History   Social History  . Marital status: Married    Spouse name: N/A  . Number of children: N/A  . Years of education: N/A   Social History Main Topics  . Smoking status: Former Research scientist (life sciences)  . Smokeless tobacco: Never Used  . Alcohol use None  . Drug use: Unknown  . Sexual activity: Not Asked   Other Topics Concern  . None   Social History Narrative  . None   No Known Allergies no known drug allergies No family history on file. Family history of rheumatological diseases.  Past medical history, social, surgical and family history all reviewed in electronic medical record.  No pertanent information unless stated regarding to the chief complaint.   Review of Systems: No headache, visual changes, nausea, vomiting, diarrhea, constipation, dizziness,  abdominal pain, skin rash, fevers, chills, night sweats, weight loss, swollen lymph nodes, body aches, joint swelling, muscle aches, chest pain, shortness of breath, mood changes.    Objective  Blood pressure 110/80, pulse 97, height 5\' 4"  (1.626 m), weight 174 lb (78.9 kg), SpO2 97 %.   Systems examined below as of 03/17/17 General: NAD A&O x3 mood, affect normal  HEENT: Pupils equal, extraocular movements intact no nystagmus Respiratory: not short of breath at rest or with speaking Cardiovascular: No lower extremity edema, non tender Skin: Warm dry intact with no signs of infection or rash on extremities or on axial skeleton. Abdomen: Soft nontender, no masses Neuro: Cranial nerves  intact, neurovascularly intact in all extremities with 2+ DTRs and 2+ pulses. Lymph: No lymphadenopathy appreciated today  Gait antalgic gait which is new MSK: Non tender with full range of motion and good stability and symmetric strength and tone of shoulders, elbows, wrist,  knee and ankles bilaterally.   YIF:OYDX ROM IR: 5 Deg, ER: 45 Deg, Flexion: 100 Deg, Extension: 80 Deg, Abduction: 25 Deg, Adduction: 15 Deg Strength 4-5 which is worsening from previous exam. Positive fulcrum test still of the proximal femur. Worsening pain with internal rotation of the hip.        Impression and Recommendations:     This case required medical decision making of moderate complexity.      Note: This dictation was prepared with Dragon dictation along with smaller phrase technology. Any transcriptional errors that result from this process are unintentional.

## 2017-03-17 NOTE — Assessment & Plan Note (Signed)
Patient is having worsening symptoms. Not responding to conservative therapy at this point. We were doing ultrasound and do not think it is necessary and I do feel that an MRI would be more likely to show the pedicle fracture at this time. Patient is having worsening range of motion as well as strength in the sling. Affecting a walk now. Affecting other daily activities. Patient will get the MRI and we will further evaluate. Depending on findings we'll discuss different treatment options.

## 2017-03-17 NOTE — Patient Instructions (Addendum)
Good to see you  I am sorry not  A lot of change At this point with it still affecting daily activities lets get MRI of the hip and see what it is.  Depending on findings I will write you and we will discuss the next steps Continue the vitamin D

## 2017-03-26 ENCOUNTER — Other Ambulatory Visit: Payer: Self-pay | Admitting: Internal Medicine

## 2017-03-27 ENCOUNTER — Ambulatory Visit
Admission: RE | Admit: 2017-03-27 | Discharge: 2017-03-27 | Disposition: A | Discharge status: Home / Self Care | Payer: BC Managed Care – PPO | Source: Ambulatory Visit | Attending: Family Medicine | Admitting: Family Medicine

## 2017-03-27 DIAGNOSIS — M84352S Stress fracture, left femur, sequela: Secondary | ICD-10-CM

## 2017-04-19 ENCOUNTER — Other Ambulatory Visit: Payer: Self-pay | Admitting: Internal Medicine

## 2017-04-25 NOTE — Progress Notes (Signed)
[name] [place], [place] [address] Phone: [phone] Subjective:    I'm seeing this patient by the request  of:  [name], MD   CC: Left hip pain f/u [id number]  Carrie [name] is a 46 y.o. female coming in with complaint of left hip pain.   Patient was having worsening pain and was having signs but seemed to be concerning for an occult fracture. MRI was done. MRI was independently visualized by me. Patient's MRI did not show any type of stress fracture but severe gluteal tendinitis. Patient states Having pain more on the posterior aspect of the hip. Looking for some relief. Has made some small improvement over the course of time. Impression x-rays were taken previously. Hip x-rays taken 01/18/2017 were independently visualized by me showing no significant bony normality. No past medical history on file. No past surgical history on file. Social History   Social History  . Marital status: Married    Spouse name: N/A  . Number of children: N/A  . Years of education: N/A   Social History Main Topics  . Smoking status: Former Research scientist (life sciences)  . Smokeless tobacco: Never Used  . Alcohol use None  . Drug use: Unknown  . Sexual activity: Not Asked   Other Topics Concern  . None   Social History Narrative  . None   No Known Allergies no known drug allergies No family history on file. Family history of rheumatological diseases.  Past medical history, social, surgical and family history all reviewed in electronic medical record.  No pertanent information unless stated regarding to the chief complaint.   Review of Systems: No headache, visual changes, nausea, vomiting, diarrhea, constipation, dizziness, abdominal pain, skin rash, fevers, chills, night sweats, weight loss, swollen lymph nodes, body aches, joint swelling, muscle aches, chest pain, shortness of breath, mood changes.    Objective  Blood pressure 122/82, pulse 78,  height 5\' 4"  (1.626 m), weight 174 lb (78.9 kg).   Systems examined below as of 04/26/17 General: NAD A&O x3 mood, affect normal  HEENT: Pupils equal, extraocular movements intact no nystagmus Respiratory: not short of breath at rest or with speaking Cardiovascular: No lower extremity edema, non tender Skin: Warm dry intact with no signs of infection or rash on extremities or on axial skeleton. Abdomen: Soft nontender, no masses Neuro: Cranial nerves  intact, neurovascularly intact in all extremities with 2+ DTRs and 2+ pulses. Lymph: No lymphadenopathy appreciated today  Gait antalgic gait which is new MSK: Non tender with full range of motion and good stability and symmetric strength and tone of shoulders, elbows, wrist,  knee and ankles bilaterally.   Hip: Left ROM IR: 25 Deg, ER: 25 Deg, Flexion: 120 Deg, Extension: 100 Deg, Abduction: 45 Deg, Adduction: 25 Deg Strength IR: 5/5, ER: 5/5, Flexion: 5/5, Extension: 5/5, Abduction: 4/5, Adduction: 5/5 Pelvic alignment unremarkable to inspection and palpation. Standing hip rotation and gait without trendelenburg sign / unsteadiness. Greater trochanter without tenderness to palpation. No tenderness over piriformis and greater trochanter. No pain with FABER or FADIR. No SI joint tenderness and normal minimal SI movement. Contralateral hip unremarkable  Procedure: Real-time Ultrasound Guided Injection of left gluteal tendon Device: GE Logiq Q7 Ultrasound guided injection is preferred based studies that show increased duration, increased effect, greater accuracy, decreased procedural pain, increased response rate, and decreased cost with ultrasound guided versus blind injection.  Verbal informed consent obtained.  Time-out conducted.  Noted no overlying  erythema, induration, or other signs of local infection.  Skin prepped in a sterile fashion.  Local anesthesia: Topical Ethyl chloride.  With sterile technique and under real time  ultrasound guidance:  With a 22-gauge 3 inch no patient was injected with a total of 2 mL of 0.5% Marcaine and 1 mL of Kenalog 40 g/dL into the gluteal tendon. Completed without difficulty  Pain immediately resolved suggesting accurate placement of the medication.  Advised to call if fevers/chills, erythema, induration, drainage, or persistent bleeding.  Images permanently stored and available for review in the ultrasound unit.  Impression: Technically successful ultrasound guided injection.       Impression and Recommendations:     This case required medical decision making of moderate complexity.      Note: This dictation was prepared with Dragon dictation along with smaller phrase technology. Any transcriptional errors that result from this process are unintentional.

## 2017-04-26 ENCOUNTER — Ambulatory Visit (INDEPENDENT_AMBULATORY_CARE_PROVIDER_SITE_OTHER): Payer: BC Managed Care – PPO | Admitting: Family Medicine

## 2017-04-26 ENCOUNTER — Encounter: Payer: Self-pay | Admitting: Family Medicine

## 2017-04-26 ENCOUNTER — Ambulatory Visit: Payer: Self-pay

## 2017-04-26 VITALS — BP 122/82 | HR 78 | Ht 64.0 in | Wt 174.0 lb

## 2017-04-26 DIAGNOSIS — M25552 Pain in left hip: Secondary | ICD-10-CM

## 2017-04-26 DIAGNOSIS — M7602 Gluteal tendinitis, left hip: Secondary | ICD-10-CM

## 2017-04-26 NOTE — Assessment & Plan Note (Signed)
Patient given injection. Patient tolerated the procedure well with good resolution of pain. We discussed other treatment options including PRP. We discussed avoiding certain activities for the first 72 hours. Patient may do more icing. Patient will come back more weeks.

## 2017-04-26 NOTE — Patient Instructions (Signed)
Good to see yo  Ice 20 minutes 2 times daily. Usually after activity and before bed. Stay active.  I think it will help a lot in 2 weeks See me again I n4 weeks.

## 2017-05-26 ENCOUNTER — Ambulatory Visit (INDEPENDENT_AMBULATORY_CARE_PROVIDER_SITE_OTHER)
Admission: RE | Admit: 2017-05-26 | Discharge: 2017-05-26 | Disposition: A | Discharge status: Home / Self Care | Payer: BC Managed Care – PPO | Source: Ambulatory Visit | Attending: Family Medicine | Admitting: Family Medicine

## 2017-05-26 ENCOUNTER — Encounter: Payer: Self-pay | Admitting: Family Medicine

## 2017-05-26 ENCOUNTER — Ambulatory Visit (INDEPENDENT_AMBULATORY_CARE_PROVIDER_SITE_OTHER): Payer: BC Managed Care – PPO | Admitting: Family Medicine

## 2017-05-26 VITALS — BP 120/82 | HR 85 | Ht 64.0 in | Wt 178.0 lb

## 2017-05-26 DIAGNOSIS — G8929 Other chronic pain: Secondary | ICD-10-CM

## 2017-05-26 DIAGNOSIS — M5442 Lumbago with sciatica, left side: Secondary | ICD-10-CM

## 2017-05-26 DIAGNOSIS — M7602 Gluteal tendinitis, left hip: Secondary | ICD-10-CM

## 2017-05-26 MED ORDER — GABAPENTIN 300 MG PO CAPS: ORAL_CAPSULE | ORAL | 3 refills | Status: DC

## 2017-05-26 MED ORDER — GABAPENTIN 100 MG PO CAPS: 200.0000 mg | ORAL_CAPSULE | Freq: Two times a day (BID) | ORAL | 3 refills | Status: DC

## 2017-05-26 NOTE — Progress Notes (Signed)
[name] [place], [place] [address] Phone: [phone] Subjective:    I'm seeing this patient by the request  of:  [name], MD   CC: Left hip pain f/u [id number]  Carrie [name] is a 46 y.o. female coming in with complaint of left hip pain.   Patient was having worsening pain and was having signs but seemed to be concerning for an occult fracture. MRI was done. MRI was independently visualized by me. Patient's MRI did not show any type of stress fracture but severe gluteal tendinitis. We attempted a steroid injection. Patient states that she did have about 80% improvement with the injection up for about 2 weeks and then the pain seemed to come back. Affecting daily activities. Patient did have vacation and even when she was at the beach she was having worsening symptoms that made it impossible for her to enjoy some of the family activities. Continues to have pain at night as well. Mild radiation down the left leg.   No past medical history on file. No past surgical history on file. Social History   Social History  . Marital status: Married    Spouse name: N/A  . Number of children: N/A  . Years of education: N/A   Social History Main Topics  . Smoking status: Former Research scientist (life sciences)  . Smokeless tobacco: Never Used  . Alcohol use None  . Drug use: Unknown  . Sexual activity: Not Asked   Other Topics Concern  . None   Social History Narrative  . None   No Known Allergies no known drug allergies No family history on file. Family history of rheumatological diseases.  Past medical history, social, surgical and family history all reviewed in electronic medical record.  No pertanent information unless stated regarding to the chief complaint.   Review of Systems: No headache, visual changes, nausea, vomiting, diarrhea, constipation, dizziness, abdominal pain, skin rash, fevers, chills, night sweats, weight loss, swollen lymph  nodes, body aches, joint swelling, muscle aches, chest pain, shortness of breath, mood changes.     Objective  Blood pressure 120/82, pulse 85, height 5\' 4"  (1.626 m), weight 178 lb (80.7 kg), SpO2 98 %.   Systems examined below as of 05/26/17 General: NAD A&O x3 mood, affect normal  HEENT: Pupils equal, extraocular movements intact no nystagmus Respiratory: not short of breath at rest or with speaking Cardiovascular: No lower extremity edema, non tender Skin: Warm dry intact with no signs of infection or rash on extremities or on axial skeleton. Abdomen: Soft nontender, no masses Neuro: Cranial nerves  intact, neurovascularly intact in all extremities with 2+ DTRs and 2+ pulses. Lymph: No lymphadenopathy appreciated today  Gait mild antalgic gait MSK: Non tender with full range of motion and good stability and symmetric strength and tone of shoulders, elbows, wrist,  knee and ankles bilaterally.   Hip: Left ROM IR: 25 Deg, ER: 45 Deg, Flexion: 120 Deg, Extension: 100 Deg, Abduction: 35 Deg, Adduction: 25 Deg Strength IR: 5/5, ER: 5/5, Flexion: 5/5, Extension: 5/5, Abduction: 4/5 compared to contralateral sign, Adduction: 5/5 Pelvic alignment unremarkable to inspection and palpation. Mild weakness with Trendelenburg Severe tenderness to palpation over the lateral hip especially at the insertion of the gluteal tendon Positive Faber No SI joint tenderness and normal minimal SI movement.      Impression and Recommendations:     This case required medical decision making of moderate complexity.  Note: This dictation was prepared with Dragon dictation along with smaller phrase technology. Any transcriptional errors that result from this process are unintentional.

## 2017-05-26 NOTE — Patient Instructions (Addendum)
Good to see you  Ice is your friend Coninsder the PRP  Xray of back downstairs today  Gabapentin 100mg  in AM, 100mg  in PM and 300mg  at night Otherwise we could try physical therapy

## 2017-05-26 NOTE — Assessment & Plan Note (Signed)
Spent  25 minutes with patient face-to-face and had greater than 50% of counseling including as described in assessment and plan. This included patient different possibilities including PRP injection, stem cell, formal physical therapy again, or possible surgical intervention. Patient will consider this. Increase patient's gabapentin to 100 mg in a.m. and p.m. in 300 mg at night. Lumbar x-rays ordered to rule out any other lumbar radiculopathy that could be contributing but very unlikely. Discussed icing regimen and continue all other conservative therapy patient follow-up with me again in 4-6 weeks

## 2017-06-01 ENCOUNTER — Ambulatory Visit: Payer: Self-pay

## 2017-06-01 ENCOUNTER — Other Ambulatory Visit: Payer: BC Managed Care – PPO

## 2017-06-01 ENCOUNTER — Encounter: Payer: Self-pay | Admitting: Family Medicine

## 2017-06-01 ENCOUNTER — Ambulatory Visit (INDEPENDENT_AMBULATORY_CARE_PROVIDER_SITE_OTHER): Payer: BC Managed Care – PPO | Admitting: Family Medicine

## 2017-06-01 VITALS — BP 128/74 | HR 67

## 2017-06-01 DIAGNOSIS — M7602 Gluteal tendinitis, left hip: Secondary | ICD-10-CM

## 2017-06-01 DIAGNOSIS — S76012D Strain of muscle, fascia and tendon of left hip, subsequent encounter: Secondary | ICD-10-CM

## 2017-06-01 DIAGNOSIS — G5702 Lesion of sciatic nerve, left lower limb: Secondary | ICD-10-CM

## 2017-06-01 NOTE — Assessment & Plan Note (Signed)
PRP injected 06/01/2017

## 2017-06-01 NOTE — Progress Notes (Signed)
[name] [place], [place] [address] Phone: [phone] Subjective:    I'm seeing this patient by the request  of:  [name], MD   CC: Left hip pain f/u [id number]  Carrie [name] is a 46 y.o. female coming in with complaint of left hip pain.   Patient was having worsening pain and was having signs but seemed to be concerning for an occult fracture. MRI was done. MRI was independently visualized by me. Patient's MRI did not show any type of stress fracture but severe gluteal tendinitis. We attempted a steroid injection. Patient states that she did have about 80% improvement with the injection up for about 2 weeks and then the pain seemed to come back.  Worsening with conservative therapy again. Patient is here for her be injection   No past medical history on file. No past surgical history on file. Social History   Social History  . Marital status: Married    Spouse name: N/A  . Number of children: N/A  . Years of education: N/A   Social History Main Topics  . Smoking status: Former Research scientist (life sciences)  . Smokeless tobacco: Never Used  . Alcohol use Not on file  . Drug use: Unknown  . Sexual activity: Not on file   Other Topics Concern  . Not on file   Social History Narrative  . No narrative on file   No Known Allergies no known drug allergies No family history on file. Family history of rheumatological diseases.  Past medical history, social, surgical and family history all reviewed in electronic medical record.  No pertanent information unless stated regarding to the chief complaint.   Review of Systems: No headache, visual changes, nausea, vomiting, diarrhea, constipation, dizziness, abdominal pain, skin rash, fevers, chills, night sweats, weight loss, swollen lymph nodes, body aches, joint swelling, muscle aches, chest pain, shortness of breath, mood changes.     Objective  Blood pressure 128/74, pulse 67, last  menstrual period 05/05/2017, SpO2 98 %.   Systems examined below as of 06/01/17 General: NAD A&O x3 mood, affect normal  HEENT: Pupils equal, extraocular movements intact no nystagmus Respiratory: not short of breath at rest or with speaking Cardiovascular: No lower extremity edema, non tender Skin: Warm dry intact with no signs of infection or rash on extremities or on axial skeleton. Abdomen: Soft nontender, no masses Neuro: Cranial nerves  intact, neurovascularly intact in all extremities with 2+ DTRs and 2+ pulses. Lymph: No lymphadenopathy appreciated today  Gait mild antalgic gait MSK: Non tender with full range of motion and good stability and symmetric strength and tone of shoulders, elbows, wrist,  knee and ankles bilaterally.   Hip: Left ROM IR: 25 Deg, ER: 45 Deg, Flexion: 120 Deg, Extension: 100 Deg, Abduction: 35 Deg, Adduction: 25 Deg Strength IR: 5/5, ER: 5/5, Flexion: 5/5, Extension: 5/5, Abduction: 4/5 compared to contralateral sign, Adduction: 5/5 Pelvic alignment unremarkable to inspection and palpation. Mild weakness with Trendelenburg Continued pain in the insertion of the gluteal tendon Positive Faber No SI joint tenderness and normal minimal SI movement.  Procedure: Real-time Ultrasound Guided Injection of left tibial tendon sheath Device: GE Logiq Q7 Ultrasound guided injection is preferred based studies that show increased duration, increased effect, greater accuracy, decreased procedural pain, increased response rate, and decreased cost with ultrasound guided versus blind injection.  Verbal informed consent obtained.  Time-out conducted.  Noted no overlying erythema, induration, or other signs of local infection.  Skin prepped in a sterile fashion.  Local anesthesia: Topical Ethyl chloride.  With sterile technique and under real time ultrasound guidance: With a 22-gauge 3 inch needle patient was injected with a total of 2 mL of 0.5% Marcaine and then injected  with 4 mL of pre-centrifuged PRP.  Completed without difficulty  Pain immediately resolved suggesting accurate placement of the medication.  Advised to call if fevers/chills, erythema, induration, drainage, or persistent bleeding.  Images permanently stored and available for review in the ultrasound unit.  Impression: Technically successful ultrasound guided injection.     Impression and Recommendations:     This case required medical decision making of moderate complexity.      Note: This dictation was prepared with Dragon dictation along with smaller phrase technology. Any transcriptional errors that result from this process are unintentional.

## 2017-06-01 NOTE — Assessment & Plan Note (Signed)
Patient did have injection. Tolerated procedure well with near complete resolution of pain. We discussed icing regimen and home exercises. We discussed which activities to do in which ones to avoid. Patient will start to increase as tolerated. PRP protocol given. Follow-up again in 3 weeks

## 2017-06-01 NOTE — Patient Instructions (Addendum)
Good to see you  You should do good with the injection.  Read the sheet and follow the instructions.  See me again in 3 weeks

## 2017-06-09 ENCOUNTER — Encounter: Payer: Self-pay | Admitting: Family Medicine

## 2017-06-21 ENCOUNTER — Ambulatory Visit (INDEPENDENT_AMBULATORY_CARE_PROVIDER_SITE_OTHER): Payer: BC Managed Care – PPO | Admitting: Family Medicine

## 2017-06-21 ENCOUNTER — Encounter: Payer: Self-pay | Admitting: Family Medicine

## 2017-06-21 DIAGNOSIS — M999 Biomechanical lesion, unspecified: Secondary | ICD-10-CM

## 2017-06-21 DIAGNOSIS — M7602 Gluteal tendinitis, left hip: Secondary | ICD-10-CM

## 2017-06-21 MED ORDER — VENLAFAXINE HCL ER 37.5 MG PO CP24: 37.5000 mg | ORAL_CAPSULE | Freq: Every day | ORAL | 1 refills | Status: DC

## 2017-06-21 NOTE — Assessment & Plan Note (Signed)
Doing better at this point. We'll start manipulation and see if this will be beneficial. Differential includes a lumbar radiculopathy. Started on Effexor and will start to decrease Celexa. We'll see how patient response. Follow-up again in 3-4 weeks.

## 2017-06-21 NOTE — Patient Instructions (Addendum)
It is so good to see you  Keep up with the regimen on the sheet  Posture is key for your back and try to keep the shoulders back.  Effexor 37.5 mg daily should help with the nerve and decrease celexa to 20 mg  See me again in 3-4 weeks.

## 2017-06-21 NOTE — Assessment & Plan Note (Signed)
Decision today to treat with OMT was based on Physical Exam  After verbal consent patient was treated with HVLA, ME, FPR techniques in  thoracic, lumbar and sacral areas  Patient tolerated the procedure well with improvement in symptoms  Patient given exercises, stretches and lifestyle modifications  See medications in patient instructions if given  Patient will follow up in 3-4 weeks 

## 2017-06-21 NOTE — Progress Notes (Signed)
[name] [address] [place], [place] [address] Phone: [phone] Subjective:    I'm seeing this patient by the request  of:  [name], MD   CC: Left hip pain f/u [id number]  Carrie [name] is a 46 y.o. female coming in with complaint of left hip pain.   Patient was having worsening pain and was having signs but seemed to be concerning for an occult fracture. MRI was done. MRI was independently visualized by me. Patient's MRI did not show any type of stress fracture but severe gluteal tendinitis. We attempted a steroid injection. Patient was improving. Started having worsening pain and did elect to try the PRP injection. Patient is now 3 weeks out from then. Patient states doing better overall. Seems about 70% better. Still some mild intermittent radicular symptoms. Still some mild back pain as well. Seems to be more in the upper back.   No past medical history on file. No past surgical history on file. Social History   Social History  . Marital status: Married    Spouse name: N/A  . Number of children: N/A  . Years of education: N/A   Social History Main Topics  . Smoking status: Former Research scientist (life sciences)  . Smokeless tobacco: Never Used  . Alcohol use None  . Drug use: Unknown  . Sexual activity: Not Asked   Other Topics Concern  . None   Social History Narrative  . None   No Known Allergies no known drug allergies No family history on file. Family history of rheumatological diseases.  Past medical history, social, surgical and family history all reviewed in electronic medical record.  No pertanent information unless stated regarding to the chief complaint.   Review of Systems: No headache, visual changes, nausea, vomiting, diarrhea, constipation, dizziness, abdominal pain, skin rash, fevers, chills, night sweats, weight loss, swollen lymph nodes, body aches, joint swelling, chest pain, shortness of breath, mood changes.  Positive  muscle aches   Objective  Blood pressure 128/82, height 5\' 4"  (1.626 m), weight 180 lb (81.6 kg).   Systems examined below as of 06/21/17 General: NAD A&O x3 mood, affect normal  HEENT: Pupils equal, extraocular movements intact no nystagmus Respiratory: not short of breath at rest or with speaking Cardiovascular: No lower extremity edema, non tender Skin: Warm dry intact with no signs of infection or rash on extremities or on axial skeleton. Abdomen: Soft nontender, no masses Neuro: Cranial nerves  intact, neurovascularly intact in all extremities with 2+ DTRs and 2+ pulses. Lymph: No lymphadenopathy appreciated today  Gait normal with good balance and coordination.  MSK: Non tender with full range of motion and good stability and symmetric strength and tone of shoulders, elbows, wrist,  knee and ankles bilaterally.   Objective exam shows the patient does still have a mild positive Corky Sox. Still some mild pain over the distal aspect of the gluteus. Back exam the patient does have a pain on the left sacroiliac joint. Mild and appears palmar musculature.  Osteopathic findings C2 flexed rotated and side bent right C4 flexed rotated and side bent left C7 flexed rotated and side bent left T3 extended rotated and side bent left inhaled third rib T6 extended rotated and side bent left L2 flexed rotated and side bent right Sacrum right on right     Impression and Recommendations:     This case required medical decision making of moderate complexity.      Note: This dictation was  prepared with Dragon dictation along with smaller phrase technology. Any transcriptional errors that result from this process are unintentional.

## 2017-07-12 ENCOUNTER — Ambulatory Visit (INDEPENDENT_AMBULATORY_CARE_PROVIDER_SITE_OTHER): Payer: BC Managed Care – PPO | Admitting: Family Medicine

## 2017-07-12 ENCOUNTER — Encounter: Payer: Self-pay | Admitting: Family Medicine

## 2017-07-12 VITALS — BP 120/82 | HR 83 | Ht 64.0 in | Wt 179.0 lb

## 2017-07-12 DIAGNOSIS — M999 Biomechanical lesion, unspecified: Secondary | ICD-10-CM

## 2017-07-12 DIAGNOSIS — G5702 Lesion of sciatic nerve, left lower limb: Secondary | ICD-10-CM

## 2017-07-12 NOTE — Assessment & Plan Note (Signed)
Decision today to treat with OMT was based on Physical Exam  After verbal consent patient was treated with HVLA, ME, FPR techniques in cervical, thoracic, lumbar and sacral areas  Patient tolerated the procedure well with improvement in symptoms  Patient given exercises, stretches and lifestyle modifications  See medications in patient instructions if given  Patient will follow up in 4 weeks 

## 2017-07-12 NOTE — Patient Instructions (Addendum)
Good to see you  As always, keep it up  Stay active.  Avoid heavy lifting but you are making progress.  I think we stay on same medication but we will consider increasing effexor at follow up  New exercises for the hip If not better we will either MRI back or consider injection in the hip  See me again in 4 weeks!

## 2017-07-12 NOTE — Progress Notes (Signed)
[name] [name], [name] [address] Phone: [phone] Subjective:    I'm seeing this patient by the request  of:  Carrie Lor, MD   CC: Left hip pain f/u [id number]  Carrie [name] is a 46 y.o. female coming in with complaint of left hip pain.   Patient was having worsening pain and was having signs but seemed to be concerning for an occult fracture. MRI was done. MRI was independently visualized by me. Patient's MRI did not show any type of stress fracture but severe gluteal tendinitis. We attempted a steroid injection. Patient was improving. Started having worsening pain and did elect to try the PRP injection. States that the piriformis is feeling 85-90% better. Patient states that the Effexor is also helped out significantly with some radiation the pain. Now having more localized pain that seems to be on the anterior aspect of the hip. Worse with pushing up or going upstairs. Patient denies any radiation down the leg.   No past medical history on file. No past surgical history on file. Social History   Social History  . Marital status: Married    Spouse name: N/A  . Number of children: N/A  . Years of education: N/A   Social History Main Topics  . Smoking status: Former Research scientist (life sciences)  . Smokeless tobacco: Never Used  . Alcohol use None  . Drug use: Unknown  . Sexual activity: Not Asked   Other Topics Concern  . None   Social History Narrative  . None   No Known Allergies no known drug allergies No family history on file. Family history of rheumatological diseases.  Past medical history, social, surgical and family history all reviewed in electronic medical record.  No pertanent information unless stated regarding to the chief complaint.   Review of Systems: No headache, visual changes, nausea, vomiting, diarrhea, constipation, dizziness, abdominal pain, skin rash, fevers, chills, night sweats, weight loss, swollen  lymph nodes, body aches, joint swelling, muscle aches, chest pain, shortness of breath, mood changes.    Objective  Blood pressure 120/82, pulse 83, height 5\' 4"  (1.626 m), weight 179 lb (81.2 kg), SpO2 98 %.   Systems examined below as of 07/12/17 General: NAD A&O x3 mood, affect normal  HEENT: Pupils equal, extraocular movements intact no nystagmus Respiratory: not short of breath at rest or with speaking Cardiovascular: No lower extremity edema, non tender Skin: Warm dry intact with no signs of infection or rash on extremities or on axial skeleton. Abdomen: Soft nontender, no masses Neuro: Cranial nerves  intact, neurovascularly intact in all extremities with 2+ DTRs and 2+ pulses. Lymph: No lymphadenopathy appreciated today  Gait normal with good balance and coordination.  MSK: Non tender with full range of motion and good stability and symmetric strength and tone of shoulders, elbows, wrist,  knee and ankles bilaterally.    Back exam shows some mild tightness with extension. Otherwise full range of motion. Patient's left hip does show some pain with internal range of motion as well as external range of motion that seems to be over the tensor fascia lata. Hip abductor strength 4+ out of 5. Osteopathic findings C2 flexed rotated and side bent right C4 flexed rotated and side bent left C6 flexed rotated and side bent left T3 extended rotated and side bent right inhaled third rib T5 extended rotated and side bent left L2 flexed rotated and side bent right Sacrum right on right  Impression and Recommendations:     This case required medical decision making of moderate complexity.      Note: This dictation was prepared with Dragon dictation along with smaller phrase technology. Any transcriptional errors that result from this process are unintentional.

## 2017-07-12 NOTE — Assessment & Plan Note (Signed)
Seems to be doing relatively well. We discussed we can repeat injections if needed. Has responded fairly well to the Effexor. We will continue to monitor. Follow-up with me again in 4 weeks.

## 2017-07-22 ENCOUNTER — Encounter: Payer: Self-pay | Admitting: Internal Medicine

## 2017-08-07 NOTE — Progress Notes (Signed)
[name] [address], [place] [address] Phone: [phone] Subjective:    I'm seeing this patient by the request  of:  [name], MD   CC: Left hip pain f/u [id number]  Carrie [name] is a 46 y.o. female coming in with complaint of left hip pain.   Patient was having worsening pain and was having signs but seemed to be concerning for an occult fracture. MRI was done. MRI was independently visualized by me. Patient's MRI did not show any type of stress fracture but severe gluteal tendinitis. We attempted a steroid injection. Patient was improving. Started having worsening pain and did elect to try the PRP injection. States that the piriformis is feeling 85-90% better. Patient states that the Effexor is also helped out significantly with some radiation the pain. Now having more localized pain that seems to be on the anterior aspect of the hip. Worse with pushing up or going upstairs. Patient denies any radiation down the leg. She feels that her pain is getting better but she still has some days when she is in sharp pain. She is unable to lay on her side.    No past medical history on file. No past surgical history on file. Social History   Social History  . Marital status: Married    Spouse name: N/A  . Number of children: N/A  . Years of education: N/A   Social History Main Topics  . Smoking status: Former Research scientist (life sciences)  . Smokeless tobacco: Never Used  . Alcohol use Not on file  . Drug use: Unknown  . Sexual activity: Not on file   Other Topics Concern  . Not on file   Social History Narrative  . No narrative on file   No Known Allergies no known drug allergies No family history on file. Family history of rheumatological diseases.  Past medical history, social, surgical and family history all reviewed in electronic medical record.  No pertanent information unless stated regarding to the chief complaint.   Review of Systems:  No headache, visual changes, nausea, vomiting, diarrhea, constipation, dizziness, abdominal pain, skin rash, fevers, chills, night sweats, weight loss, swollen lymph nodes, body aches, joint swelling, muscle aches, chest pain, shortness of breath, mood changes.     Objective  There were no vitals taken for this visit.   Systems examined below as of 08/07/17 General: NAD A&O x3 mood, affect normal  HEENT: Pupils equal, extraocular movements intact no nystagmus Respiratory: not short of breath at rest or with speaking Cardiovascular: No lower extremity edema, non tender Skin: Warm dry intact with no signs of infection or rash on extremities or on axial skeleton. Abdomen: Soft nontender, no masses Neuro: Cranial nerves  intact, neurovascularly intact in all extremities with 2+ DTRs and 2+ pulses. Lymph: No lymphadenopathy appreciated today  Gait normal with good balance and coordination.  MSK: Non tender with full range of motion and good stability and symmetric strength and tone of shoulders, elbows, wrist,  knee and ankles bilaterally.    Back exam shows the patient is some mild tenderness of the left sacroiliac joint. Patient does have tightness with the Fabere test. Negative straight leg brace. Patient still has some mild pain over the gluteal tendon as well as somewhat into the piriformis. No pain on the anterior aspect the hip is that was last time. Full strength of the lower extremity.  Osteopathic findings C2 flexed rotated and side bent left  T3  extended rotated and side bent right inhaled third rib T6 extended rotated and side bent right  L2 flexed rotated and side bent right Sacrum left on left       Impression and Recommendations:     This case required medical decision making of moderate complexity.      Note: This dictation was prepared with Dragon dictation along with smaller phrase technology. Any transcriptional errors that result from this process are  unintentional.

## 2017-08-09 ENCOUNTER — Encounter: Payer: Self-pay | Admitting: Family Medicine

## 2017-08-09 ENCOUNTER — Encounter: Payer: Self-pay | Admitting: Internal Medicine

## 2017-08-09 ENCOUNTER — Ambulatory Visit (INDEPENDENT_AMBULATORY_CARE_PROVIDER_SITE_OTHER): Payer: BC Managed Care – PPO | Admitting: Family Medicine

## 2017-08-09 ENCOUNTER — Ambulatory Visit (INDEPENDENT_AMBULATORY_CARE_PROVIDER_SITE_OTHER)
Admission: RE | Admit: 2017-08-09 | Discharge: 2017-08-09 | Disposition: A | Discharge status: Home / Self Care | Payer: BC Managed Care – PPO | Source: Ambulatory Visit | Attending: Family Medicine | Admitting: Family Medicine

## 2017-08-09 VITALS — BP 128/90 | HR 92 | Ht 64.0 in | Wt 181.0 lb

## 2017-08-09 DIAGNOSIS — M999 Biomechanical lesion, unspecified: Secondary | ICD-10-CM | POA: Diagnosis not present

## 2017-08-09 DIAGNOSIS — M7602 Gluteal tendinitis, left hip: Secondary | ICD-10-CM | POA: Diagnosis not present

## 2017-08-09 MED ORDER — VENLAFAXINE HCL ER 75 MG PO CP24: 75.0000 mg | ORAL_CAPSULE | Freq: Every day | ORAL | 1 refills | Status: DC

## 2017-08-09 NOTE — Addendum Note (Signed)
Addended by: Douglass Rivers T on: 08/09/2017 04:31 PM   Modules accepted: Orders

## 2017-08-09 NOTE — Assessment & Plan Note (Signed)
Patient seems to be doing much better after the pair be injection. We discussed possible repeat which patient declined. Patient will get x-rays of the lower back. We discussed further evaluation of this. We discussed which activities to do a which was to avoid. Patient increasing the Effexor and decreasing Celexa. We discussed staying active. Follow-up again in 6-8 weeks

## 2017-08-09 NOTE — Assessment & Plan Note (Signed)
Decision today to treat with OMT was based on Physical Exam  After verbal consent patient was treated with HVLA, ME, FPR techniques in cervical, thoracic, lumbar and sacral areas  Patient tolerated the procedure well with improvement in symptoms  Patient given exercises, stretches and lifestyle modifications  See medications in patient instructions if given  Patient will follow up in 4 weeks 

## 2017-08-09 NOTE — Patient Instructions (Signed)
Good to see you  Carrie [name] is your friend.  New exercises for the back  Effexor 75 mg daily  Stop the celexa.  Standing desk at work note given  See me again in 4 weeks.

## 2017-08-26 ENCOUNTER — Other Ambulatory Visit: Payer: Self-pay | Admitting: Obstetrics and Gynecology

## 2017-08-26 DIAGNOSIS — N63 Unspecified lump in unspecified breast: Secondary | ICD-10-CM

## 2017-09-03 ENCOUNTER — Ambulatory Visit: Payer: BC Managed Care – PPO

## 2017-09-03 ENCOUNTER — Other Ambulatory Visit: Payer: Self-pay | Admitting: Obstetrics and Gynecology

## 2017-09-03 ENCOUNTER — Ambulatory Visit
Admission: RE | Admit: 2017-09-03 | Discharge: 2017-09-03 | Disposition: A | Discharge status: Home / Self Care | Payer: BC Managed Care – PPO | Source: Ambulatory Visit | Attending: Obstetrics and Gynecology | Admitting: Obstetrics and Gynecology

## 2017-09-03 DIAGNOSIS — N63 Unspecified lump in unspecified breast: Secondary | ICD-10-CM

## 2017-09-06 ENCOUNTER — Ambulatory Visit: Payer: BC Managed Care – PPO | Admitting: Family Medicine

## 2017-09-06 ENCOUNTER — Encounter: Payer: Self-pay | Admitting: Family Medicine

## 2017-09-06 VITALS — BP 130/82 | HR 90 | Ht 64.0 in | Wt 183.0 lb

## 2017-09-06 DIAGNOSIS — G5702 Lesion of sciatic nerve, left lower limb: Secondary | ICD-10-CM

## 2017-09-06 DIAGNOSIS — M999 Biomechanical lesion, unspecified: Secondary | ICD-10-CM

## 2017-09-06 DIAGNOSIS — D367 Benign neoplasm of other specified sites: Secondary | ICD-10-CM | POA: Diagnosis not present

## 2017-09-06 DIAGNOSIS — L729 Follicular cyst of the skin and subcutaneous tissue, unspecified: Secondary | ICD-10-CM | POA: Diagnosis not present

## 2017-09-06 NOTE — Progress Notes (Signed)
[name] [place], [place] [address] Phone: [phone] Subjective:    I'm seeing this patient by the request  of:    CC: Back pain follow-up  [id number]  Carrie [name] is a 46 y.o. female coming in with complaint of back pain.  Was found to have more of the left piriformis syndrome and gluteal tendinitis.  Was given an injection and was seeming to respond fairly well.  Patient started osteopathic manipulation.  The patient has been taking Effexor..  Increased Effexor dose August 09, 2018.  Patient states she is improving at this time.  Making seem to.  Has noted some mild increase in emotional changes.  Patient also states that the left forearm has a cyst.  We will checked out.  Patient states that it is tender when putting pressure on it.  No erythema.  No recent injury.  Has had it for years but seems to be growing slowly.       No past medical history on file. No past surgical history on file. Social History   Socioeconomic History  . Marital status: Married    Spouse name: None  . Number of children: None  . Years of education: None  . Highest education level: None  Social Needs  . Financial resource strain: None  . Food insecurity - worry: None  . Food insecurity - inability: None  . Transportation needs - medical: None  . Transportation needs - non-medical: None  Occupational History  . None  Tobacco Use  . Smoking status: Former Research scientist (life sciences)  . Smokeless tobacco: Never Used  Substance and Sexual Activity  . Alcohol use: None  . Drug use: None  . Sexual activity: None  Other Topics Concern  . None  Social History Narrative  . None   No Known Allergies No family history on file.   Past medical history, social, surgical and family history all reviewed in electronic medical record.  No pertanent information unless stated regarding to the chief complaint.   Review of Systems:Review of systems updated and as  accurate as of 09/06/17  No headache, visual changes, nausea, vomiting, diarrhea, constipation, dizziness, abdominal pain, skin rash, fevers, chills, night sweats, weight loss, swollen lymph nodes, body aches, joint swelling, muscle aches, chest pain, shortness of breath, mood changes.   Objective  Blood pressure 130/82, pulse 90, height 5\' 4"  (1.626 m), weight 183 lb (83 kg), SpO2 98 %. Systems examined below as of 09/06/17   General: No apparent distress alert and oriented x3 mood and affect normal, dressed appropriately.  HEENT: Pupils equal, extraocular movements intact  Respiratory: Patient's speak in full sentences and does not appear short of breath  Cardiovascular: No lower extremity edema, non tender, no erythema  Skin: Warm dry intact with no signs of infection or rash on extremities or on axial skeleton.  Forearm though does have an inclusion cyst.  Freely movable.  Approximately 2 cm in diameter. Abdomen: Soft nontender  Neuro: Cranial nerves II through XII are intact, neurovascularly intact in all extremities with 2+ DTRs and 2+ pulses.  Lymph: No lymphadenopathy of posterior or anterior cervical chain or axillae bilaterally.  Gait normal with good balance and coordination.  MSK:  Non tender with full range of motion and good stability and symmetric strength and tone of shoulders, elbows, wrist, hip, knee and ankles bilaterally.  Back Exam:  Inspection: Unremarkable  Motion: Flexion 45 deg, Extension 45 deg, Side Bending to 45  deg bilaterally,  Rotation to 45 deg bilaterally  SLR laying: Negative  XSLR laying: Negative  Palpable tenderness: To palpation still in the paraspinal musculature of the lumbar spine left greater than right.Marland Kitchen FABER: That of left. Sensory change: Gross sensation intact to all lumbar and sacral dermatomes.  Reflexes: 2+ at both patellar tendons, 2+ at achilles tendons, Babinski's downgoing.  Strength at foot  Plantar-flexion: 5/5 Dorsi-flexion: 5/5  Eversion: 5/5 Inversion: 5/5  Leg strength  Quad: 5/5 Hamstring: 5/5 Hip flexor: 5/5 Hip abductors: 4/5 metric gait unremarkable.   Osteopathic findings C7 flexed rotated and side bent left T3 extended rotated and side bent right inhaled third rib T9 extended rotated and side bent left L2 flexed rotated and side bent right Sacrum right on right     Impression and Recommendations:     This case required medical decision making of moderate complexity.      Note: This dictation was prepared with Dragon dictation along with smaller phrase technology. Any transcriptional errors that result from this process are unintentional.

## 2017-09-06 NOTE — Patient Instructions (Signed)
Good to see you  Ice is your friend Dennis Bast are doing great  Other options would be to increase effexor or consider low dose celexa See me again in 5-6 weeks.,

## 2017-09-06 NOTE — Assessment & Plan Note (Signed)
Decision today to treat with OMT was based on Physical Exam  After verbal consent patient was treated with HVLA, ME, FPR techniques in cervical, thoracic, lumbar and sacral areas  Patient tolerated the procedure well with improvement in symptoms  Patient given exercises, stretches and lifestyle modifications  See medications in patient instructions if given  Patient will follow up in 5-6 weeks

## 2017-09-06 NOTE — Assessment & Plan Note (Signed)
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  Patient wants to potentially removed.  Send to dermatology.

## 2017-09-06 NOTE — Assessment & Plan Note (Signed)
Patient seems to be doing relatively well.  Did not think any repeat injections are necessary at this time.  We discussed icing regimen and home exercises.  Patient will continue with the Effexor.  Patient will consider going up or possibly adding a small dose of Celexa if continuing to have difficulty with the emotional component.  Patient will follow up with me again in 5-6 weeks

## 2017-10-05 ENCOUNTER — Other Ambulatory Visit: Payer: Self-pay | Admitting: Family Medicine

## 2017-10-11 ENCOUNTER — Ambulatory Visit: Payer: BC Managed Care – PPO | Admitting: Family Medicine

## 2017-10-14 ENCOUNTER — Ambulatory Visit: Payer: BC Managed Care – PPO | Admitting: Family Medicine

## 2017-10-14 ENCOUNTER — Other Ambulatory Visit: Payer: Self-pay

## 2017-10-14 ENCOUNTER — Encounter: Payer: Self-pay | Admitting: Family Medicine

## 2017-10-14 VITALS — BP 122/82 | HR 93 | Ht 64.0 in | Wt 182.0 lb

## 2017-10-14 DIAGNOSIS — M7602 Gluteal tendinitis, left hip: Secondary | ICD-10-CM | POA: Diagnosis not present

## 2017-10-14 DIAGNOSIS — M999 Biomechanical lesion, unspecified: Secondary | ICD-10-CM

## 2017-10-14 MED ORDER — GABAPENTIN 100 MG PO CAPS: 200.0000 mg | ORAL_CAPSULE | Freq: Two times a day (BID) | ORAL | 3 refills | Status: DC

## 2017-10-14 NOTE — Patient Instructions (Addendum)
Good to see you Carrie [name] is your friend.  Continue the effexor See me again in 7-8 weeks  Happy holidays!

## 2017-10-14 NOTE — Assessment & Plan Note (Addendum)
Decision today to treat with OMT was based on Physical Exam  After verbal consent patient was treated with HVLA, ME, FPR techniques in , thoracic, lumbar and sacral areas  Patient tolerated the procedure well with improvement in symptoms  Patient given exercises, stretches and lifestyle modifications  See medications in patient instructions if given  Patient will follow up in 6-8 weeks

## 2017-10-14 NOTE — Progress Notes (Signed)
[name] [place],  [address] Phone: [phone] Subjective:      CC: Back pain follow-up  [id number]  Carrie [name] is a 46 y.o. female coming in for follow up for back pain. She has been doing fairly well. She has been using gabapentin as needed.  Patient has been seen previously.  Has not difficulty.  Patient has responded very well to a left gluteal tendon injection.  Patient was also doing fairly well with PRP in the area.    No past medical history on file. No past surgical history on file. Social History   Socioeconomic History  . Marital status: Married    Spouse name: None  . Number of children: None  . Years of education: None  . Highest education level: None  Social Needs  . Financial resource strain: None  . Food insecurity - worry: None  . Food insecurity - inability: None  . Transportation needs - medical: None  . Transportation needs - non-medical: None  Occupational History  . None  Tobacco Use  . Smoking status: Former Research scientist (life sciences)  . Smokeless tobacco: Never Used  Substance and Sexual Activity  . Alcohol use: None  . Drug use: None  . Sexual activity: None  Other Topics Concern  . None  Social History Narrative  . None   No Known Allergies No family history on file.  Patient denies any autoimmune disease   Past medical history, social, surgical and family history all reviewed in electronic medical record.  No pertanent information unless stated regarding to the chief complaint.   Review of Systems:Review of systems updated and as accurate as of 10/14/17  No headache, visual changes, nausea, vomiting, diarrhea, constipation, dizziness, abdominal pain, skin rash, fevers, chills, night sweats, weight loss, swollen lymph nodes, body aches, joint swelling, muscle aches, chest pain, shortness of breath, mood changes.   Objective  Blood pressure 122/82, pulse 93, height 5\' 4"  (1.626 m), weight 182 lb  (82.6 kg), SpO2 98 %. Systems examined below as of 10/14/17   General: No apparent distress alert and oriented x3 mood and affect normal, dressed appropriately.  HEENT: Pupils equal, extraocular movements intact  Respiratory: Patient's speak in full sentences and does not appear short of breath  Cardiovascular: No lower extremity edema, non tender, no erythema  Skin: Warm dry intact with no signs of infection or rash on extremities or on axial skeleton.  Abdomen: Soft nontender  Neuro: Cranial nerves II through XII are intact, neurovascularly intact in all extremities with 2+ DTRs and 2+ pulses.  Lymph: No lymphadenopathy of posterior or anterior cervical chain or axillae bilaterally.  Gait normal with good balance and coordination.  MSK:  Non tender with full range of motion and good stability and symmetric strength and tone of shoulders, elbows, wrist, hip, knee and ankles bilaterally.  Back Exam:  Inspection: Unremarkable  Motion: Flexion 45 deg, Extension 25 deg, Side Bending to 45 deg bilaterally,  Rotation to 45 deg bilaterally  SLR laying: Negative  XSLR laying: Negative  Palpable tenderness: Mild discomfort over the insertion of the gluteal tendon. FABER: Positive Faber on the left. Sensory change: Gross sensation intact to all lumbar and sacral dermatomes.  Reflexes: 2+ at both patellar tendons, 2+ at achilles tendons, Babinski's downgoing.  Strength at foot  Plantar-flexion: 5/5 Dorsi-flexion: 5/5 Eversion: 5/5 Inversion: 5/5  Leg strength  Quad: 5/5 Hamstring: 5/5 Hip flexor: 5/5 Hip abductors: 5/5  Gait unremarkable.  Osteopathic findings C7 flexed rotated and side bent left T3 extended rotated and side bent right inhaled third rib T11 extended rotated and side bent left L2 flexed rotated and side bent right Sacrum right on right    Impression and Recommendations:     This case required medical decision making of moderate complexity.      Note: This dictation  was prepared with Dragon dictation along with smaller phrase technology. Any transcriptional errors that result from this process are unintentional.

## 2017-10-14 NOTE — Assessment & Plan Note (Signed)
Patient has been doing relatively well with the gluteal tendon injection.  I do think that this is likely contributing to some of it.  Has responded very well.  Follow-up with me again in 6-8 weeks.  Continue the Effexor

## 2017-12-06 ENCOUNTER — Ambulatory Visit: Payer: BC Managed Care – PPO | Admitting: Family Medicine

## 2017-12-15 ENCOUNTER — Ambulatory Visit: Payer: BC Managed Care – PPO | Admitting: Family Medicine

## 2017-12-15 ENCOUNTER — Encounter: Payer: Self-pay | Admitting: Family Medicine

## 2017-12-15 VITALS — BP 120/82 | HR 91 | Ht 64.0 in | Wt 182.0 lb

## 2017-12-15 DIAGNOSIS — M7602 Gluteal tendinitis, left hip: Secondary | ICD-10-CM

## 2017-12-15 DIAGNOSIS — M999 Biomechanical lesion, unspecified: Secondary | ICD-10-CM | POA: Diagnosis not present

## 2017-12-15 DIAGNOSIS — M7551 Bursitis of right shoulder: Secondary | ICD-10-CM

## 2017-12-15 NOTE — Assessment & Plan Note (Signed)
Stable.  No worsening symptoms at this time.  Responded well to PRP and can always repeat.  Responding well to osteopathic manipulation.  Follow-up again in 4-8 weeks

## 2017-12-15 NOTE — Assessment & Plan Note (Signed)
Injected today.  Tolerated procedure well.  Discussed icing regimen.  Follow-up consider ultrasound for further detail.

## 2017-12-15 NOTE — Assessment & Plan Note (Signed)
Decision today to treat with OMT was based on Physical Exam  After verbal consent patient was treated with HVLA, ME, FPR techniques in cervical, thoracic, lumbar and sacral areas  Patient tolerated the procedure well with improvement in symptoms  Patient given exercises, stretches and lifestyle modifications  See medications in patient instructions if given  Patient will follow up in 4-8 weeks 

## 2017-12-15 NOTE — Progress Notes (Signed)
[name] [place], [place] [address] Phone: [phone] Subjective:    I'm seeing this patient by the request  of:    CC: Back pain follow-up  [id number]  Carrie [name] is a 47 y.o. female coming in with complaint of low back pain.  Was found to have more of a gluteal tendinitis that did respond fairly well to PRP injections.  Continue to have some mild radicular symptoms and started on Effexor.  I have been doing osteopathic manipulation as well.  Doing well overall.  New right shoulder pain.  Describes pain as a dull, throbbing aching pain.  Rates the severity of pain is 7 out of 10.    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  No past medical history on file. No past surgical history on file. Social History   Socioeconomic History  . Marital status: Married    Spouse name: None  . Number of children: None  . Years of education: None  . Highest education level: None  Social Needs  . Financial resource strain: None  . Food insecurity - worry: None  . Food insecurity - inability: None  . Transportation needs - medical: None  . Transportation needs - non-medical: None  Occupational History  . None  Tobacco Use  . Smoking status: Former Research scientist (life sciences)  . Smokeless tobacco: Never Used  Substance and Sexual Activity  . Alcohol use: None  . Drug use: None  . Sexual activity: None  Other Topics Concern  . None  Social History Narrative  . None   No Known Allergies No family history on file.  No family history of autoimmune   Past medical history, social, surgical and family history all reviewed in electronic medical record.  No pertanent information unless stated regarding to the chief complaint.   Review of Systems:Review of systems updated and as accurate as of 12/15/17  No headache, visual changes, nausea, vomiting, diarrhea, constipation,  dizziness, abdominal pain, skin rash, fevers, chills, night sweats, weight loss, swollen lymph nodes, body aches, joint swelling, muscle aches, chest pain, shortness of breath, mood changes.   Objective  Blood pressure 120/82, pulse 91, height 5\' 4"  (1.626 m), weight 182 lb (82.6 kg), SpO2 98 %. Systems examined below as of 12/15/17   General: No apparent distress alert and oriented x3 mood and affect normal, dressed appropriately.  HEENT: Pupils equal, extraocular movements intact  Respiratory: Patient's speak in full sentences and does not appear short of breath  Cardiovascular: No lower extremity edema, non tender, no erythema  Skin: Warm dry intact with no signs of infection or rash on extremities or on axial skeleton.  Abdomen: Soft nontender  Neuro: Cranial nerves II through XII are intact, neurovascularly intact in all extremities with 2+ DTRs and 2+ pulses.  Lymph: No lymphadenopathy of posterior or anterior cervical chain or axillae bilaterally.  Gait normal with good balance and coordination.  MSK:  Non tender with full range of motion and good stability and symmetric strength and tone of  elbows, wrist, hip, knee and ankles bilaterally.    Shoulder: Right Inspection reveals no abnormalities, atrophy or asymmetry. Palpation is normal with no tenderness over AC joint or bicipital groove. ROM is full in all planes passively. Rotator cuff strength normal throughout. signs of impingement with positive Neer and Hawkin's tests, but negative empty can sign. Speeds and Yergason's tests normal.  No labral pathology noted with negative Obrien's, negative clunk and good stability. Normal scapular function observed. No painful arc and no drop arm sign. No apprehension sign  MSK US performed of: Right This study was ordered, performed, and interpreted by Charlann Boxer D.O.  Shoulder:   Supraspinatus:  Appears normal on long and transverse views, Bursal bulge seen with shoulder abduction on  impingement view. Infraspinatus:  Appears normal on long and transverse views. Significant increase in Doppler flow Subscapularis:  Appears normal on long and transverse views. Positive bursa Teres Minor:  Appears normal on long and transverse views. AC joint:  Capsule undistended, no geyser sign. Glenohumeral Joint:  Appears normal without effusion. Glenoid Labrum:  Intact without visualized tears. Biceps Tendon:  Appears normal on long and transverse views, no fraying of tendon, tendon located in intertubercular groove, no subluxation with shoulder internal or external rotation.  Impression: Subacromial bursitis  Procedure: Real-time Ultrasound Guided Injection of right glenohumeral joint Device: GE Logiq E  Ultrasound guided injection is preferred based studies that show increased duration, increased effect, greater accuracy, decreased procedural pain, increased response rate with ultrasound guided versus blind injection.  Verbal informed consent obtained.  Time-out conducted.  Noted no overlying erythema, induration, or other signs of local infection.  Skin prepped in a sterile fashion.  Local anesthesia: Topical Ethyl chloride.  With sterile technique and under real time ultrasound guidance:  Joint visualized.  23g 1  inch needle inserted posterior approach. Pictures taken for needle placement. Patient did have injection of 2 cc of 1% lidocaine, 2 cc of 0.5% Marcaine, and 1.0 cc of Kenalog 40 mg/dL. Completed without difficulty  Pain immediately resolved suggesting accurate placement of the medication.  Advised to call if fevers/chills, erythema, induration, drainage, or persistent bleeding.  Images permanently stored and available for review in the ultrasound unit.  Impression: Technically successful ultrasound guided injection.  Osteopathic findings C2 flexed rotated and side bent right C4 flexed rotated and side bent left C6 flexed rotated and side bent left T3 extended rotated  and side bent right inhaled third rib T9 extended rotated and side bent left L2 flexed rotated and side bent right Sacrum right on right     Impression and Recommendations:     This case required medical decision making of moderate complexity.      Note: This dictation was prepared with Dragon dictation along with smaller phrase technology. Any transcriptional errors that result from this process are unintentional.

## 2017-12-15 NOTE — Patient Instructions (Signed)
Good to see you  Ice is your friend Exercises 3 times a week.  Injected the shoulder and I hope it helps.  Keep hands within peripheral vision pennsaid pinkie amount topically 2 times daily as needed.  See me again in 2 months!

## 2018-01-26 ENCOUNTER — Ambulatory Visit: Payer: BC Managed Care – PPO | Admitting: Family Medicine

## 2018-01-26 ENCOUNTER — Encounter: Payer: Self-pay | Admitting: Family Medicine

## 2018-01-26 VITALS — BP 130/88 | HR 99 | Ht 64.0 in | Wt 176.0 lb

## 2018-01-26 DIAGNOSIS — M25511 Pain in right shoulder: Secondary | ICD-10-CM | POA: Diagnosis not present

## 2018-01-26 DIAGNOSIS — M999 Biomechanical lesion, unspecified: Secondary | ICD-10-CM

## 2018-01-26 NOTE — Progress Notes (Signed)
[name] [place], [place] [address] Phone: [phone] Subjective:     CC: Neck and back pain  [id number]  Carrie [name] is a 47 y.o. female coming in with complaint of neck and right shoulder pain.  Patient was given an injection of the shoulder that helped out some.  Patient now is having worsening pain seem to be in the upper trapezius area level going towards the neck.  No radiation down the arm.  Rates the severity of pain though is 7 out of 10.      No past medical history on file. No past surgical history on file. Social History   Socioeconomic History  . Marital status: Married    Spouse name: Not on file  . Number of children: Not on file  . Years of education: Not on file  . Highest education level: Not on file  Occupational History  . Not on file  Social Needs  . Financial resource strain: Not on file  . Food insecurity:    Worry: Not on file    Inability: Not on file  . Transportation needs:    Medical: Not on file    Non-medical: Not on file  Tobacco Use  . Smoking status: Former Research scientist (life sciences)  . Smokeless tobacco: Never Used  Substance and Sexual Activity  . Alcohol use: Not on file  . Drug use: Not on file  . Sexual activity: Not on file  Lifestyle  . Physical activity:    Days per week: Not on file    Minutes per session: Not on file  . Stress: Not on file  Relationships  . Social connections:    Talks on phone: Not on file    Gets together: Not on file    Attends religious service: Not on file    Active member of club or organization: Not on file    Attends meetings of clubs or organizations: Not on file    Relationship status: Not on file  Other Topics Concern  . Not on file  Social History Narrative  . Not on file   No Known Allergies No family history on file.  No family history of autoimmune   Past medical history, social, surgical and family history all reviewed in electronic medical  record.  No pertanent information unless stated regarding to the chief complaint.   Review of Systems:Review of systems updated and as accurate as of 01/26/18  No headache, visual changes, nausea, vomiting, diarrhea, constipation, dizziness, abdominal pain, skin rash, fevers, chills, night sweats, weight loss, swollen lymph nodes, body aches, joint swelling,  chest pain, shortness of breath, mood changes.  Mild positive muscle aches  Objective  Blood pressure 130/88, pulse 99, height 5\' 4"  (1.626 m), weight 176 lb (79.8 kg), SpO2 98 %. Systems examined below as of 01/26/18   General: No apparent distress alert and oriented x3 mood and affect normal, dressed appropriately.  HEENT: Pupils equal, extraocular movements intact  Respiratory: Patient's speak in full sentences and does not appear short of breath  Cardiovascular: No lower extremity edema, non tender, no erythema  Skin: Warm dry intact with no signs of infection or rash on extremities or on axial skeleton.  Abdomen: Soft nontender  Neuro: Cranial nerves II through XII are intact, neurovascularly intact in all extremities with 2+ DTRs and 2+ pulses.  Lymph: No lymphadenopathy of posterior or anterior cervical chain or axillae bilaterally.  Gait normal with good balance and  coordination.  MSK:  Non tender with full range of motion and good stability and symmetric strength and tone of shoulders, elbows, wrist, hip, knee and ankles bilaterally.  Neck: Inspection mild loss of lordosis. No palpable stepoffs. Negative Spurling's maneuver. Mild limitation in all planes mostly with right-sided side bending and left-sided rotation Grip strength and sensation normal in bilateral hands Strength good C4 to T1 distribution No sensory change to C4 to T1 Negative Hoffman sign bilaterally Reflexes normal Tightness of right trapezius noted  Osteopathic findings C2 flexed rotated and side bent right C6 flexed rotated and side bent right  T3  extended rotated and side bent right inhaled third rib L2 flexed rotated and side bent right Sacrum right on right   Consent patient was prepped with alcohol swabs and with a 25-gauge half inch needle injected in total of 4 specific trigger points in the right shoulder region.  Total of 3 cc of 0.5% Marcaine and 1 cc of Kenalog 40 mg/mL used Impression and Recommendations:     This case required medical decision making of moderate complexity.      Note: This dictation was prepared with Dragon dictation along with smaller phrase technology. Any transcriptional errors that result from this process are unintentional.

## 2018-01-26 NOTE — Assessment & Plan Note (Signed)
Given injection.  Tolerated procedure well.  Also did osteopathic manipulation.  Encourage patient to continue to stay active.  Follow-up with me again in 4-6 weeks

## 2018-01-26 NOTE — Assessment & Plan Note (Signed)
Decision today to treat with OMT was based on Physical Exam  After verbal consent patient was treated with HVLA, ME, FPR techniques in cervical, thoracic, lumbar and sacral areas  Patient tolerated the procedure well with improvement in symptoms  Patient given exercises, stretches and lifestyle modifications  See medications in patient instructions if given  Patient will follow up in 4-6 weeks 

## 2018-01-26 NOTE — Patient Instructions (Signed)
God to see you  Carrie [name] is your friend.  Lets hope this helps  Otherwise will need to look at neck again  You will do fine See me again in 4 weeks

## 2018-02-07 ENCOUNTER — Other Ambulatory Visit: Payer: Self-pay | Admitting: Family Medicine

## 2018-02-24 ENCOUNTER — Encounter: Payer: Self-pay | Admitting: Family Medicine

## 2018-02-24 ENCOUNTER — Ambulatory Visit: Payer: BC Managed Care – PPO | Admitting: Family Medicine

## 2018-02-24 VITALS — BP 124/84 | HR 89 | Ht 64.0 in | Wt 176.0 lb

## 2018-02-24 DIAGNOSIS — M7602 Gluteal tendinitis, left hip: Secondary | ICD-10-CM

## 2018-02-24 DIAGNOSIS — M19011 Primary osteoarthritis, right shoulder: Secondary | ICD-10-CM | POA: Diagnosis not present

## 2018-02-24 DIAGNOSIS — M999 Biomechanical lesion, unspecified: Secondary | ICD-10-CM

## 2018-02-24 NOTE — Assessment & Plan Note (Signed)
Stable at the moment.  Still some mild positive Corky Sox.  Discussed icing regimen and home exercises.  Discussed which will be doing which wants to avoid discussed icing regimen and home exercises.  Which activities to do which wants to avoid.  Patient will follow-up with me again in 4 to 8 weeks

## 2018-02-24 NOTE — Assessment & Plan Note (Signed)
Decision today to treat with OMT was based on Physical Exam  After verbal consent patient was treated with HVLA, ME, FPR techniques in  thoracic, lumbar and sacral areas  Patient tolerated the procedure well with improvement in symptoms  Patient given exercises, stretches and lifestyle modifications  See medications in patient instructions if given  Patient will follow up in 4-8 weeks 

## 2018-02-24 NOTE — Assessment & Plan Note (Signed)
Given injection.  Discussed icing regimen and home exercises.  Discussed which activities to do which wants to avoid. Patient will continue to be active though otherwise.  Continue topical anti-inflammatories.  Follow-up again in 4 weeks

## 2018-02-24 NOTE — Patient Instructions (Signed)
6 weeks

## 2018-02-24 NOTE — Progress Notes (Signed)
[name] [address] [place], [place] [address] Phone: [phone] Subjective:    CC: Shoulder blade pain, and back pain follow-up  GYI:[id number]  Carrie [name] is a 47 y.o. female coming in with complaint of right shoulder pain.  Was found to have more tension in the right scapula.  Attempted trigger point injections January 26, 2018.  Has responded well to manipulation in the past.  Patient states  Some tightness overall but nothing severe.  Right shoulder pain still having some discomfort now.  Patient states it seems to be more localized on the anterior aspect of the shoulder.  Her actually more of the superior aspect.  No numbness.    No past medical history on file. No past surgical history on file. Social History   Socioeconomic History  . Marital status: Married    Spouse name: Not on file  . Number of children: Not on file  . Years of education: Not on file  . Highest education level: Not on file  Occupational History  . Not on file  Social Needs  . Financial resource strain: Not on file  . Food insecurity:    Worry: Not on file    Inability: Not on file  . Transportation needs:    Medical: Not on file    Non-medical: Not on file  Tobacco Use  . Smoking status: Former Research scientist (life sciences)  . Smokeless tobacco: Never Used  Substance and Sexual Activity  . Alcohol use: Not on file  . Drug use: Not on file  . Sexual activity: Not on file  Lifestyle  . Physical activity:    Days per week: Not on file    Minutes per session: Not on file  . Stress: Not on file  Relationships  . Social connections:    Talks on phone: Not on file    Gets together: Not on file    Attends religious service: Not on file    Active member of club or organization: Not on file    Attends meetings of clubs or organizations: Not on file    Relationship status: Not on file  Other Topics Concern  . Not on file  Social History Narrative  . Not on file   No Known  Allergies No family history on file.   Past medical history, social, surgical and family history all reviewed in electronic medical record.  No pertanent information unless stated regarding to the chief complaint.   Review of Systems:Review of systems updated and as accurate as of 02/24/18  No headache, visual changes, nausea, vomiting, diarrhea, constipation, dizziness, abdominal pain, skin rash, fevers, chills, night sweats, weight loss, swollen lymph nodes, body aches, joint swelling, muscle aches, chest pain, shortness of breath, mood changes.   Objective  There were no vitals taken for this visit. Systems examined below as of 02/24/18   General: No apparent distress alert and oriented x3 mood and affect normal, dressed appropriately.  HEENT: Pupils equal, extraocular movements intact  Respiratory: Patient's speak in full sentences and does not appear short of breath  Cardiovascular: No lower extremity edema, non tender, no erythema  Skin: Warm dry intact with no signs of infection or rash on extremities or on axial skeleton.  Abdomen: Soft nontender  Neuro: Cranial nerves II through XII are intact, neurovascularly intact in all extremities with 2+ DTRs and 2+ pulses.  Lymph: No lymphadenopathy of posterior or anterior cervical chain or axillae bilaterally.  Gait normal with good  balance and coordination.  MSK:  Non tender with full range of motion and good stability and symmetric strength and tone of  elbows, wrist, hip, knee and ankles bilaterally.  Right shoulder exam shows the patient has more pain over the acromioclavicular joint.  Positive crossover.  Mild impingement.  5 out of 5 strength of the rotator cuff.  Full range of motion.  Neurovascular intact distally.  Back exam still shows some tightness in the paraspinal musculature lumbar spine left greater than right.  Negative straight leg test.  Mild positive Faber test.  Still some mild discomfort over the  piriformis.  Osteopathic findings  T3 extended rotated and side bent right inhaled third rib L2 flexed rotated and side bent right Sacrum right on right  After verbal consent patient was prepped with alcohol swabs and with a 25-gauge half inch needle was injected into the right acromioclavicular joint using a total of 0.5 cc of 0.5% Marcaine and 0.5 cc of Kenalog 40 mg/mL.  No blood loss.  Band-Aid placed.  Postinjection instructions given   Impression and Recommendations:     This case required medical decision making of moderate complexity.      Note: This dictation was prepared with Dragon dictation along with smaller phrase technology. Any transcriptional errors that result from this process are unintentional.

## 2018-03-07 NOTE — Progress Notes (Signed)
Chief Complaint  Patient presents with  . chest congestion    Cough, congestion, hoarseness x 2-3 weeks, started with a cold. Pt states that her voice is still raspy and her cough is deeper -- feels like she is getting bronchitis. Little light yellow mucous when coughing, started green about 3 weeks ago but has lessened.     HPI: Carrie [name] 47 y.o. come in for not feeling well  pcp is Dr Raliegh Ip      Had a cold and lingering for 3 weeks  And  Had bronchitis in past and hasn't cleared up.  Still has cough and tightness and right ear has  Fluid feeling   And voice gone.     2 weeks  .  Like a cold at beginning  And progressed.     And had a sever 1 day episode of right ear pain using heating pad   Had hs of ear  Pain and now just popping  And .    April 12- 15   Saw dr Tamala Julian    Trying Flonase   Since end April.   1 spray bid  Helps some  No fever   And  Short of breath ?  No tobacco  No asthma  Seasonal allergies  loratadine . otc   Usually helps .   No face pain and pressure .   Family also had resp illness in succession with cough  ROS: See pertinent positives and negatives per HPI. No fever  No underlying    lung disease as noted .  Snoring more since  This started  No past medical history on file.  No family history on file.  Social History   Socioeconomic History  . Marital status: Married    Spouse name: Not on file  . Number of children: Not on file  . Years of education: Not on file  . Highest education level: Not on file  Occupational History  . Not on file  Social Needs  . Financial resource strain: Not on file  . Food insecurity:    Worry: Not on file    Inability: Not on file  . Transportation needs:    Medical: Not on file    Non-medical: Not on file  Tobacco Use  . Smoking status: Former Research scientist (life sciences)  . Smokeless tobacco: Never Used  Substance and Sexual Activity  . Alcohol use: Not on file  . Drug use: Not on file  . Sexual activity: Not on file  Lifestyle   . Physical activity:    Days per week: Not on file    Minutes per session: Not on file  . Stress: Not on file  Relationships  . Social connections:    Talks on phone: Not on file    Gets together: Not on file    Attends religious service: Not on file    Active member of club or organization: Not on file    Attends meetings of clubs or organizations: Not on file    Relationship status: Not on file  Other Topics Concern  . Not on file  Social History Narrative  . Not on file    Outpatient Medications Prior to Visit  Medication Sig Dispense Refill  . ALPRAZolam (XANAX) 0.25 MG tablet Take 0.25 mg by mouth 3 (three) times daily as needed for anxiety.    . B Complex-C (B-COMPLEX WITH VITAMIN C) tablet Take 1 tablet by mouth daily.    . Calcium Citrate-Vitamin  D (CALCIUM + D PO) Take by mouth 2 (two) times daily. Calcium 1,000 mg and Vit D 3 100 IU    . gabapentin (NEURONTIN) 100 MG capsule Take 2 capsules (200 mg total) by mouth 2 (two) times daily. 60 capsule 3  . gabapentin (NEURONTIN) 300 MG capsule TAKE 1 CAPSULE BY MOUTH NIGHTLY 30 capsule 3  . levothyroxine (SYNTHROID, LEVOTHROID) 100 MCG tablet Take 100 mcg by mouth daily before breakfast.    . Melatonin 10 MG CAPS Take 10 mg by mouth.    . Norethin Ace-Eth Estrad-FE (GILDESS FE 1/20 PO) Take 1 tablet by mouth daily.    Marland Kitchen venlafaxine XR (EFFEXOR-XR) 75 MG 24 hr capsule TAKE 1 CAPSULE BY MOUTH DAILY WITH BREAKFAST. 90 capsule 1  . vitamin C (ASCORBIC ACID) 500 MG tablet Take 500 mg by mouth daily.    Marland Kitchen zolpidem (AMBIEN) 10 MG tablet Take 10 mg by mouth at bedtime as needed for sleep.     No facility-administered medications prior to visit.      EXAM:  BP (!) 112/58 (BP Location: Left Arm, Patient Position: Sitting, Cuff Size: Normal)   Pulse 84   Temp 98.1 F (36.7 C) (Oral)   Wt 177 lb 6.4 oz (80.5 kg)   SpO2 98%   BMI 30.45 kg/m   Body mass index is 30.45 kg/m.  GENERAL: vitals reviewed and listed above, alert,  oriented, appears well hydrated and in no acute distress  Mild hoarseness   No stridor   ocass cough    .Marland Kitchen HEENT: atraumatic, conjunctiva  clear, no obvious abnormalities on inspection of external nose and ears   Left tm nl right m mildy abnormal with  Inferior  Red area  No ob perf   Bony lm ok OP : no lesion edema or exudate   Face NT .    NECK: no obvious masses on inspection palpation  LUNGS: clear to auscultation bilaterally, no wheezes, rales or rhonchi, good air movement CV: HRRR, no clubbing cyanosis or  peripheral edema nl cap refill  MS: moves all extremities without noticeable focal  abnormality PSYCH: pleasant and cooperative, no obvious depression or anxiety Lab Results  Component Value Date   WBC 10.3 06/10/2015   HGB 14.1 06/10/2015   HCT 42.7 06/10/2015   PLT 274.0 06/10/2015   GLUCOSE 99 06/10/2015   ALT 9 06/10/2015   AST 12 06/10/2015   NA 137 06/10/2015   K 3.8 06/10/2015   CL 103 06/10/2015   CREATININE 0.77 06/10/2015   BUN 11 06/10/2015   CO2 24 06/10/2015   BP Readings from Last 3 Encounters:  03/08/18 (!) 112/58  02/24/18 124/84  01/26/18 130/88    ASSESSMENT AND PLAN:  Discussed the following assessment and plan:  Dysfunction of right eustachian tube - most likely  had acute om and resolving  expectant  counsleing and fu if not resolving or pain again   Protracted URI  Viral URI with cough - exam ok probabaly convalescing  Most likely had  Acute OM and resolving  Based on hx and  Exam   Tm almost nl but distorted and    Exam is reassuring    Other  and poss [post infectious cough  Without alarm sx  No evidence of bacterial sinusitis or ear infection bronchitis  at this time  Inc  flonase to 4 spray sper day  Total each nostril  -Patient advised to return or notify health care team  if  new  concerns arise.  Patient Instructions  Increase   flonase to 2 sprays each nostril twice a day .   Fluids   And  Saline  Nose spray     increase liquid to  loose sn any mucous  That needs to move.   If  Not improvement  In another week    Or worse with fever pain etc  Contact us  For further advice .    Chest is clear today .  And reassuring  But   You look like you had an ear infection earlier trying to heal.  [name]. Emersen Mascari M.D.

## 2018-03-08 ENCOUNTER — Encounter: Payer: Self-pay | Admitting: Internal Medicine

## 2018-03-08 ENCOUNTER — Ambulatory Visit: Payer: BC Managed Care – PPO | Admitting: Internal Medicine

## 2018-03-08 VITALS — BP 112/58 | HR 84 | Temp 98.1°F | Wt 177.4 lb

## 2018-03-08 DIAGNOSIS — H6981 Other specified disorders of Eustachian tube, right ear: Secondary | ICD-10-CM | POA: Diagnosis not present

## 2018-03-08 DIAGNOSIS — J069 Acute upper respiratory infection, unspecified: Secondary | ICD-10-CM | POA: Diagnosis not present

## 2018-03-08 DIAGNOSIS — B9789 Other viral agents as the cause of diseases classified elsewhere: Secondary | ICD-10-CM

## 2018-03-08 NOTE — Patient Instructions (Addendum)
Increase   flonase to 2 sprays each nostril twice a day .   Fluids   And  Saline  Nose spray     increase liquid to loose sn any mucous  That needs to move.   If  Not improvement  In another week    Or worse with fever pain etc  Contact us  For further advice .    Chest is clear today .  And reassuring  But   You look like you had an ear infection earlier trying to heal.

## 2018-03-14 ENCOUNTER — Ambulatory Visit
Admission: RE | Admit: 2018-03-14 | Discharge: 2018-03-14 | Disposition: A | Discharge status: Home / Self Care | Payer: BC Managed Care – PPO | Source: Ambulatory Visit | Attending: Obstetrics and Gynecology | Admitting: Obstetrics and Gynecology

## 2018-03-14 DIAGNOSIS — N63 Unspecified lump in unspecified breast: Secondary | ICD-10-CM

## 2018-04-05 NOTE — Progress Notes (Signed)
[name] [place], [place] [address] Phone: [phone] Subjective:    CC: Back pain follow-up  MOQ:[id number]  Carrie [name] is a 47 y.o. female coming in with complaint of back pain.  Has responded fairly well to osteopathic manipulation.  Patient has also had more of a right shoulder bursitis previously and was given an injection last time in January 26, 2018 as well as an acromioclavicular injection given February 24, 2018.  Patient states doing significantly better at this time.  States no significant pain at the moment.  Patient does have some back and back pain.  Something severe.     No past medical history on file. No past surgical history on file. Social History   Socioeconomic History  . Marital status: Married    Spouse name: Not on file  . Number of children: Not on file  . Years of education: Not on file  . Highest education level: Not on file  Occupational History  . Not on file  Social Needs  . Financial resource strain: Not on file  . Food insecurity:    Worry: Not on file    Inability: Not on file  . Transportation needs:    Medical: Not on file    Non-medical: Not on file  Tobacco Use  . Smoking status: Former Research scientist (life sciences)  . Smokeless tobacco: Never Used  Substance and Sexual Activity  . Alcohol use: Not on file  . Drug use: Not on file  . Sexual activity: Not on file  Lifestyle  . Physical activity:    Days per week: Not on file    Minutes per session: Not on file  . Stress: Not on file  Relationships  . Social connections:    Talks on phone: Not on file    Gets together: Not on file    Attends religious service: Not on file    Active member of club or organization: Not on file    Attends meetings of clubs or organizations: Not on file    Relationship status: Not on file  Other Topics Concern  . Not on file  Social History Narrative  . Not on file   No Known Allergies No family history on file.  No  family history of autoimmune   Past medical history, social, surgical and family history all reviewed in electronic medical record.  No pertanent information unless stated regarding to the chief complaint.   Review of Systems:Review of systems updated and as accurate as of 04/07/18  No headache, visual changes, nausea, vomiting, diarrhea, constipation, dizziness, abdominal pain, skin rash, fevers, chills, night sweats, weight loss, swollen lymph nodes, body aches, joint swelling, muscle aches, chest pain, shortness of breath, mood changes.   Objective  Blood pressure 128/80, pulse 99, height 5\' 4"  (1.626 m), weight 182 lb (82.6 kg), last menstrual period 03/08/2018, SpO2 97 %. Systems examined below as of 04/07/18   General: No apparent distress alert and oriented x3 mood and affect normal, dressed appropriately.  HEENT: Pupils equal, extraocular movements intact  Respiratory: Patient's speak in full sentences and does not appear short of breath  Cardiovascular: No lower extremity edema, non tender, no erythema  Skin: Warm dry intact with no signs of infection or rash on extremities or on axial skeleton.  Abdomen: Soft nontender  Neuro: Cranial nerves II through XII are intact, neurovascularly intact in all extremities with 2+ DTRs and 2+ pulses.  Lymph: No lymphadenopathy of posterior  or anterior cervical chain or axillae bilaterally.  Gait normal with good balance and coordination.  MSK:  Non tender with full range of motion and good stability and symmetric strength and tone of shoulders, elbows, wrist, hip, knee and ankles bilaterally.  Back Exam:  Inspection: Unremarkable  Motion: Flexion 45 deg, Extension 45 deg, Side Bending to 45 deg bilaterally,  Rotation to 45 deg bilaterally  SLR laying: Negative  XSLR laying: Negative  Palpable tenderness: Minimal discomfort over the L5-S1 area bilaterally. FABER: Tightness bilaterally. Sensory change: Gross sensation intact to all lumbar and  sacral dermatomes.  Reflexes: 2+ at both patellar tendons, 2+ at achilles tendons, Babinski's downgoing.  Strength at foot  Plantar-flexion: 5/5 Dorsi-flexion: 5/5 Eversion: 5/5 Inversion: 5/5  Leg strength  Quad: 5/5 Hamstring: 5/5 Hip flexor: 5/5 Hip abductors: 4/5 but symmetric Gait unremarkable.  Osteopathic findings T9 extended rotated and side bent left L2 flexed rotated and side bent left  Sacrum right on right     Impression and Recommendations:     This case required medical decision making of moderate complexity.      Note: This dictation was prepared with Dragon dictation along with smaller phrase technology. Any transcriptional errors that result from this process are unintentional.

## 2018-04-07 ENCOUNTER — Ambulatory Visit: Payer: BC Managed Care – PPO | Admitting: Family Medicine

## 2018-04-07 ENCOUNTER — Encounter: Payer: Self-pay | Admitting: Family Medicine

## 2018-04-07 VITALS — BP 128/80 | HR 99 | Ht 64.0 in | Wt 182.0 lb

## 2018-04-07 DIAGNOSIS — M999 Biomechanical lesion, unspecified: Secondary | ICD-10-CM | POA: Diagnosis not present

## 2018-04-07 DIAGNOSIS — M7602 Gluteal tendinitis, left hip: Secondary | ICD-10-CM | POA: Diagnosis not present

## 2018-04-07 NOTE — Assessment & Plan Note (Signed)
Patient doing remarkably well.  Continue with core strengthening, stability, icing regimen.  Patient will continue to increase activity as tolerated.  Follow-up again 4 to 8 weeks

## 2018-04-07 NOTE — Patient Instructions (Signed)
Ice is your friend.  Stay active.  I am proud of you  Have a good summer See me again in 6-8 weeks

## 2018-04-07 NOTE — Assessment & Plan Note (Addendum)
Decision today to treat with OMT was based on Physical Exam  After verbal consent patient was treated with HVLA, ME, FPR techniques in  thoracic, lumbar and sacral areas  Patient tolerated the procedure well with improvement in symptoms  Patient given exercises, stretches and lifestyle modifications  See medications in patient instructions if given  Patient will follow up in 4-8 weeks 

## 2018-04-22 ENCOUNTER — Other Ambulatory Visit: Payer: Self-pay | Admitting: Family Medicine

## 2018-04-25 NOTE — Telephone Encounter (Signed)
Refill done.  

## 2018-05-23 NOTE — Progress Notes (Signed)
[name] [place], [place] [address] Phone: [phone] Subjective:    CC:  Back pain   PFX:[id number]  Carrie [name] is a 47 y.o. female coming in with complaint of back pain. States she is feeling much better. States she'll only need 1 more injection and she'll be 100%. Shoulder is doing a lot better as well.  This is some tightness of the scapula       No past medical history on file. No past surgical history on file. Social History   Socioeconomic History  . Marital status: Married    Spouse name: Not on file  . Number of children: Not on file  . Years of education: Not on file  . Highest education level: Not on file  Occupational History  . Not on file  Social Needs  . Financial resource strain: Not on file  . Food insecurity:    Worry: Not on file    Inability: Not on file  . Transportation needs:    Medical: Not on file    Non-medical: Not on file  Tobacco Use  . Smoking status: Former Research scientist (life sciences)  . Smokeless tobacco: Never Used  Substance and Sexual Activity  . Alcohol use: Not on file  . Drug use: Not on file  . Sexual activity: Not on file  Lifestyle  . Physical activity:    Days per week: Not on file    Minutes per session: Not on file  . Stress: Not on file  Relationships  . Social connections:    Talks on phone: Not on file    Gets together: Not on file    Attends religious service: Not on file    Active member of club or organization: Not on file    Attends meetings of clubs or organizations: Not on file    Relationship status: Not on file  Other Topics Concern  . Not on file  Social History Narrative  . Not on file   No Known Allergies No family history on file.  No family history of autoimmune   Past medical history, social, surgical and family history all reviewed in electronic medical record.  No pertanent information unless stated regarding to the chief complaint.   Review of  Systems:Review of systems updated and as accurate as of 05/23/18  No headache, visual changes, nausea, vomiting, diarrhea, constipation, dizziness, abdominal pain, skin rash, fevers, chills, night sweats, weight loss, swollen lymph nodes, body aches, joint swelling, muscle aches, chest pain, shortness of breath, mood changes.   Objective  There were no vitals taken for this visit. Systems examined below as of 05/23/18   General: No apparent distress alert and oriented x3 mood and affect normal, dressed appropriately.  HEENT: Pupils equal, extraocular movements intact  Respiratory: Patient's speak in full sentences and does not appear short of breath  Cardiovascular: No lower extremity edema, non tender, no erythema  Skin: Warm dry intact with no signs of infection or rash on extremities or on axial skeleton.  Abdomen: Soft nontender  Neuro: Cranial nerves II through XII are intact, neurovascularly intact in all extremities with 2+ DTRs and 2+ pulses.  Lymph: No lymphadenopathy of posterior or anterior cervical chain or axillae bilaterally.  Gait normal with good balance and coordination.  MSK:  Non tender with full range of motion and good stability and symmetric strength and tone of shoulders, elbows, wrist, hip, knee and ankles bilaterally.  Neck: Inspection loss of lordosis.  No palpable stepoffs. Negative Spurling's maneuver. Mild limitation right next last 5 degrees of rotation to the right Grip strength and sensation normal in bilateral hands Strength good C4 to T1 distribution No sensory change to C4 to T1 Negative Hoffman sign bilaterally Reflexes normal  Osteopathic findings C2 flexed rotated and side bent right C4 flexed rotated and side bent left C6 flexed rotated and side bent left T3 extended rotated and side bent right inhaled third rib T9 extended rotated and side bent left L2 flexed rotated and side bent right Sacrum right on right  After verbal consent patient was  prepped with alcohol swabs and with a 25-gauge half inch needle injected and 3 distinct trigger points in the right shoulder region.  Total of 2 cc of 0.5% Marcaine and 1 cc of Kenalog 40 mg/mL used no blood loss post injection    Impression and Recommendations:     This case required medical decision making of moderate complexity.      Note: This dictation was prepared with Dragon dictation along with smaller phrase technology. Any transcriptional errors that result from this process are unintentional.

## 2018-05-24 ENCOUNTER — Ambulatory Visit: Payer: BC Managed Care – PPO | Admitting: Family Medicine

## 2018-05-24 ENCOUNTER — Encounter: Payer: Self-pay | Admitting: Family Medicine

## 2018-05-24 VITALS — BP 150/74 | HR 86 | Ht 64.0 in | Wt 183.0 lb

## 2018-05-24 DIAGNOSIS — M25511 Pain in right shoulder: Secondary | ICD-10-CM | POA: Diagnosis not present

## 2018-05-24 DIAGNOSIS — M999 Biomechanical lesion, unspecified: Secondary | ICD-10-CM

## 2018-05-24 NOTE — Assessment & Plan Note (Signed)
Decision today to treat with OMT was based on Physical Exam  After verbal consent patient was treated with HVLA, ME, FPR techniques in  thoracic, lumbar and sacral areas  Patient tolerated the procedure well with improvement in symptoms  Patient given exercises, stretches and lifestyle modifications  See medications in patient instructions if given  Patient will follow up in 4 weeks 

## 2018-05-24 NOTE — Patient Instructions (Addendum)
Good to see you  Alvera Singh is your friend.  Tried trigger point injections Stay active See me again in 2 months

## 2018-05-24 NOTE — Assessment & Plan Note (Signed)
Patient given injection today.  Discussed icing regimen.  Discussed posture and ergonomics.  May need further imaging of the neck with possible injections.  Responding well to manipulation O.  Follow-up again in 4 weeks

## 2018-06-13 ENCOUNTER — Other Ambulatory Visit: Payer: Self-pay | Admitting: Family Medicine

## 2018-07-26 ENCOUNTER — Ambulatory Visit: Payer: BC Managed Care – PPO | Admitting: Family Medicine

## 2018-07-26 ENCOUNTER — Encounter: Payer: Self-pay | Admitting: Family Medicine

## 2018-07-26 VITALS — BP 122/86 | HR 97 | Ht 64.0 in | Wt 183.0 lb

## 2018-07-26 DIAGNOSIS — G5702 Lesion of sciatic nerve, left lower limb: Secondary | ICD-10-CM

## 2018-07-26 DIAGNOSIS — M9901 Segmental and somatic dysfunction of cervical region: Secondary | ICD-10-CM

## 2018-07-26 DIAGNOSIS — M9904 Segmental and somatic dysfunction of sacral region: Secondary | ICD-10-CM

## 2018-07-26 DIAGNOSIS — M999 Biomechanical lesion, unspecified: Secondary | ICD-10-CM

## 2018-07-26 DIAGNOSIS — M9908 Segmental and somatic dysfunction of rib cage: Secondary | ICD-10-CM

## 2018-07-26 DIAGNOSIS — M9903 Segmental and somatic dysfunction of lumbar region: Secondary | ICD-10-CM

## 2018-07-26 DIAGNOSIS — M9902 Segmental and somatic dysfunction of thoracic region: Secondary | ICD-10-CM | POA: Diagnosis not present

## 2018-07-26 IMAGING — DX DG HIP (WITH OR WITHOUT PELVIS) 2-3V*L*
3 series · 3 of 3 positions shown · non-contrast
Comparison: None.

CLINICAL DATA: Fell 3 weeks ago.

EXAM:
DG HIP (WITH OR WITHOUT PELVIS) 2-3V LEFT

[pelvis ap]
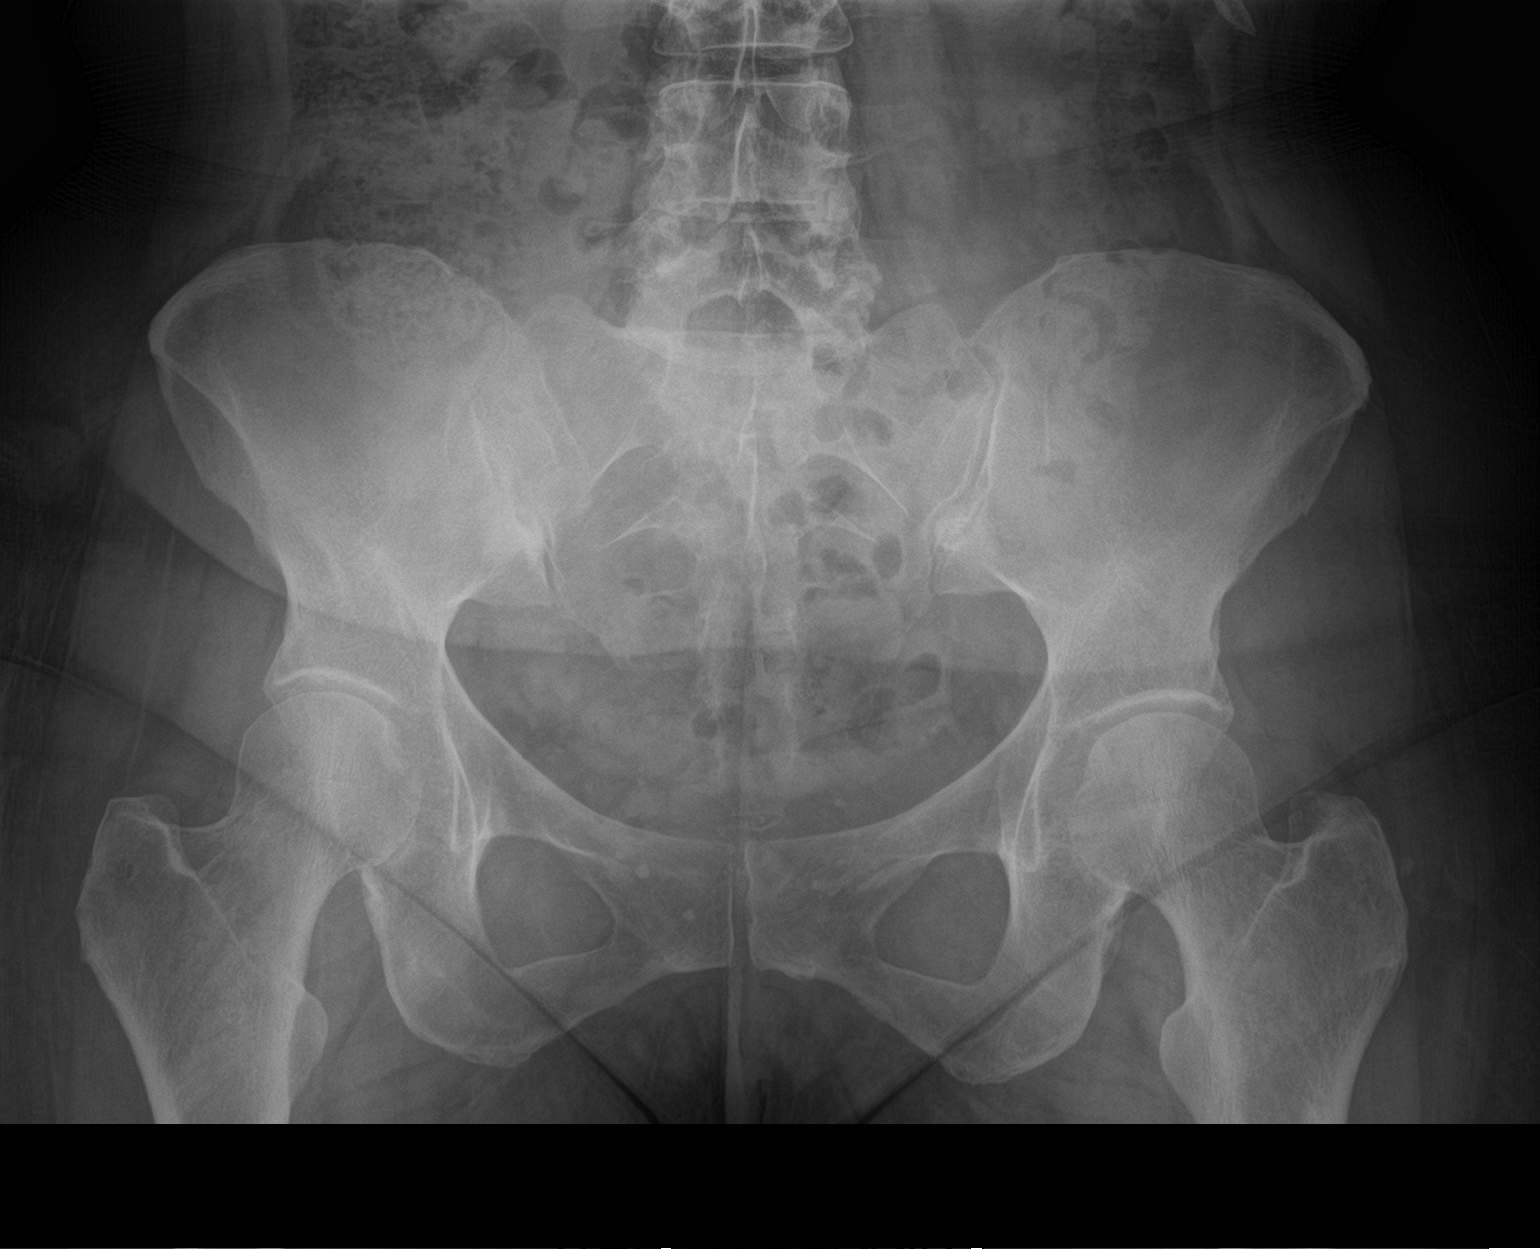

[hip ap]
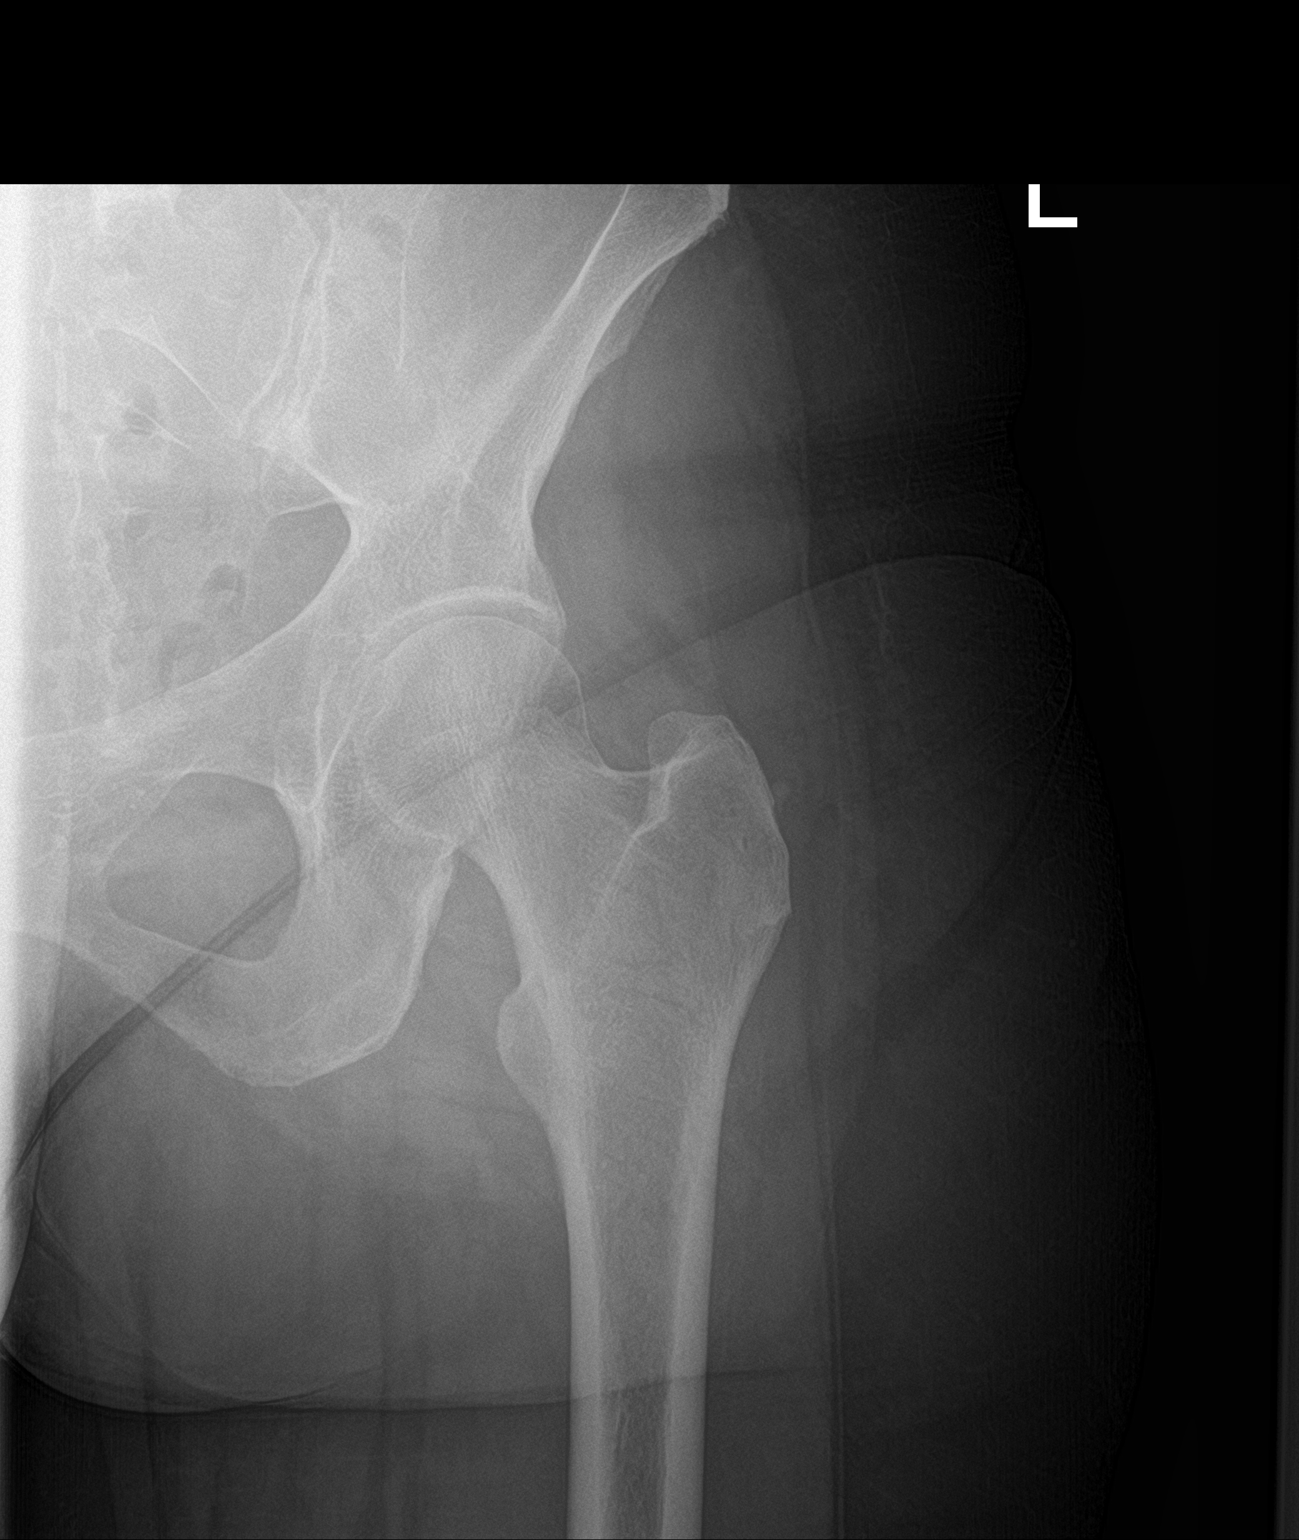

[hip lat]
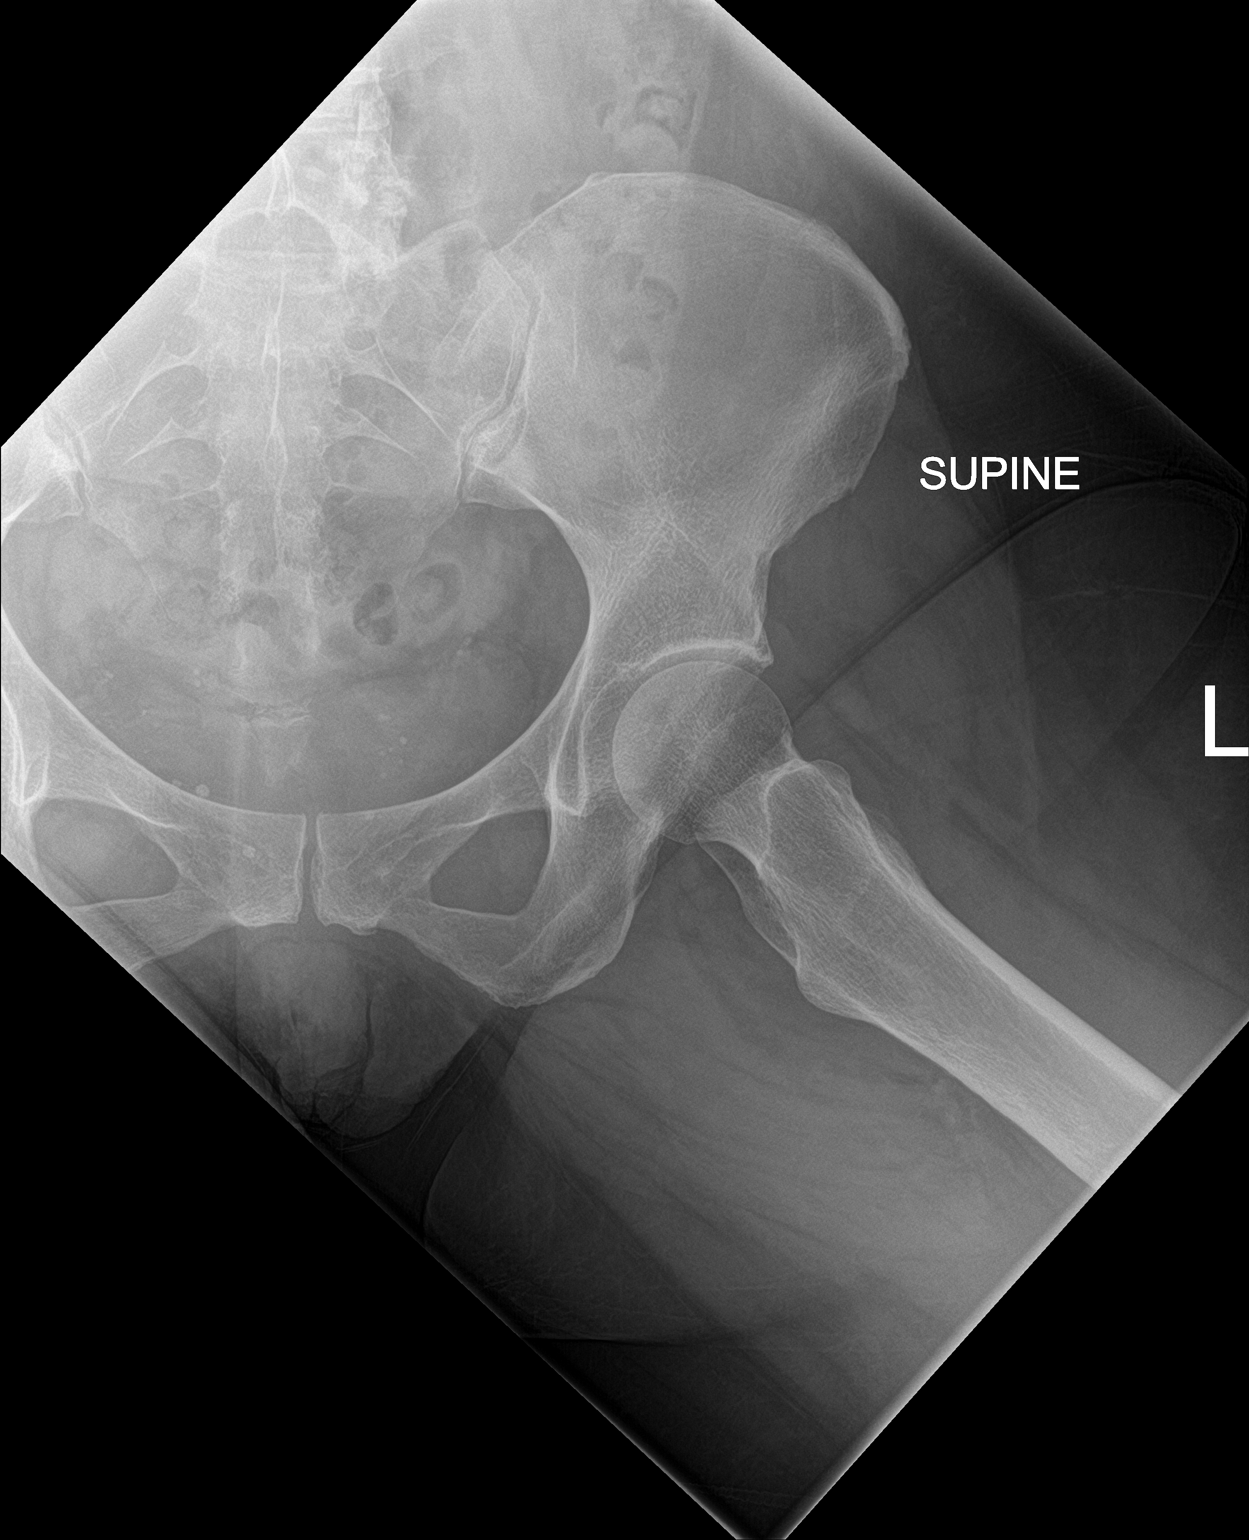

[3 of 3 positions shown; findings below may reference images not displayed]

FINDINGS: Both hips are normally located. No hip fracture or AVN. The pubic
symphysis and SI joints are normal. No bone lesions or fracture.
IMPRESSION: Normal left hip and pelvic radiographs.

## 2018-07-26 NOTE — Assessment & Plan Note (Signed)
Has made improvement overall.  Started to have some mild increase in neck pain.  Responded well to the manipulation.  No change in medications at this time.  Continue the gabapentin and the Effexor still fairly regularly.  Patient responded well to manipulation and follow-up with me again in 6 to 8 weeks

## 2018-07-26 NOTE — Patient Instructions (Signed)
Good to see you  Ice is your friend You ar edoing great  Can't wait to meet you r daughter Se eme again in 6 weeks

## 2018-07-26 NOTE — Progress Notes (Signed)
[name] [address] [place], [place] [address] Phone: [phone] Subjective:   Carrie Carrie [name], am serving as a scribe for Dr. Hulan Saas.  I'm seeing this patient by the request  of:    CC: Back pain follow-up  [id number]  Carrie Carrie [name] is a 47 y.o. female coming in with complaint of back pain. Continues to have right scapula pain that is moving into the trap. Feels the muscle "pinching". Pain is constant. Has tried stretching which has not helped long term but does alleviate her pain acutely. Denies any radiating symptoms. Is using melatonin and gabapentin at night.  Patient states more tightness.  Feels some is secondary to stress.     Carrie [name] past medical history on file. Carrie [name] past surgical history on file. Social History   Socioeconomic History  . Marital status: Married    Spouse name: Not on file  . Number of children: Not on file  . Years of education: Not on file  . Highest education level: Not on file  Occupational History  . Not on file  Social Needs  . Financial resource strain: Not on file  . Food insecurity:    Worry: Not on file    Inability: Not on file  . Transportation needs:    Medical: Not on file    Non-medical: Not on file  Tobacco Use  . Smoking status: Former Research scientist (life sciences)  . Smokeless tobacco: Never Used  Substance and Sexual Activity  . Alcohol use: Not on file  . Drug use: Not on file  . Sexual activity: Not on file  Lifestyle  . Physical activity:    Days per week: Not on file    Minutes per session: Not on file  . Stress: Not on file  Relationships  . Social connections:    Talks on phone: Not on file    Gets together: Not on file    Attends religious service: Not on file    Active member of club or organization: Not on file    Attends meetings of clubs or organizations: Not on file    Relationship status: Not on file  Other Topics Concern  . Not on file  Social History Narrative  . Not on file    Carrie [name] Known Allergies Carrie [name] family history on file.  Current Outpatient Medications (Endocrine & Metabolic):  .  levothyroxine (SYNTHROID, LEVOTHROID) 100 MCG tablet, Take 100 mcg by mouth daily before breakfast. .  Norethin Ace-Eth Estrad-FE (GILDESS FE 1/20 PO), Take 1 tablet by mouth daily.      Current Outpatient Medications (Other):  Marland Kitchen  ALPRAZolam (XANAX) 0.25 MG tablet, Take 0.25 mg by mouth 3 (three) times daily as needed for anxiety. .  B Complex-C (B-COMPLEX WITH VITAMIN C) tablet, Take 1 tablet by mouth daily. .  Calcium Citrate-Vitamin D (CALCIUM + D PO), Take by mouth 2 (two) times daily. Calcium 1,000 mg and Vit D 3 100 IU .  gabapentin (NEURONTIN) 100 MG capsule, TAKE 2 CAPSULES BY MOUTH 2 TIMES DAILY. Marland Kitchen  gabapentin (NEURONTIN) 300 MG capsule, TAKE 1 CAPSULE BY MOUTH NIGHTLY .  Melatonin 10 MG CAPS, Take 10 mg by mouth. .  venlafaxine XR (EFFEXOR-XR) 75 MG 24 hr capsule, TAKE 1 CAPSULE BY MOUTH DAILY WITH BREAKFAST. Marland Kitchen  vitamin C (ASCORBIC ACID) 500 MG tablet, Take 500 mg by mouth daily.    Past medical history, social, surgical and family history all reviewed in electronic medical record.  Carrie [name] pertanent information unless stated regarding to the chief complaint.   Review of Systems:  Carrie [name] headache, visual changes, nausea, vomiting, diarrhea, constipation, dizziness, abdominal pain, skin rash, fevers, chills, night sweats, weight loss, swollen lymph nodes, body aches, joint swelling, muscle aches, chest pain, shortness of breath, mood changes.  Positive muscle aches  Objective  Blood pressure 122/86, pulse 97, height 5\' 4"  (1.626 m), weight 183 lb (83 kg), SpO2 98 %. \   General: Carrie [name] apparent distress alert and oriented x3 mood and affect normal, dressed appropriately.  HEENT: Pupils equal, extraocular movements intact  Respiratory: Patient's speak in full sentences and does not appear short of breath  Cardiovascular: Carrie [name] lower extremity edema, non tender, Carrie [name] erythema  Skin:  Warm dry intact with Carrie [name] signs of infection or rash on extremities or on axial skeleton.  Abdomen: Soft nontender  Neuro: Cranial nerves II through XII are intact, neurovascularly intact in all extremities with 2+ DTRs and 2+ pulses.  Lymph: Carrie [name] lymphadenopathy of posterior or anterior cervical chain or axillae bilaterally.  Gait normal with good balance and coordination.  MSK:  Non tender with full range of motion and good stability and symmetric strength and tone of shoulders, elbows, wrist, hip, knee and ankles bilaterally.  Neck: Inspection unremarkable. Carrie [name] palpable stepoffs. Negative Spurling's maneuver. Loss of lordosis. Grip strength and sensation normal in bilateral hands Strength good C4 to T1 distribution Carrie [name] sensory change to C4 to T1 Negative Hoffman sign bilaterally Reflexes normal Tightness in the right trapezius Back Exam:  Inspection: Unremarkable  Motion: Flexion 45 deg, Extension 25 deg, Side Bending to 35 deg bilaterally,  Rotation to 35 deg bilaterally  SLR laying: Negative  XSLR laying: Negative  Palpable tenderness: Tender to palpation the paraspinal musculature lumbar spine right greater than left. FABER: Tightness bilaterally. Sensory change: Gross sensation intact to all lumbar and sacral dermatomes.  Reflexes: 2+ at both patellar tendons, 2+ at achilles tendons, Babinski's downgoing.  Strength at foot  Plantar-flexion: 5/5 Dorsi-flexion: 5/5 Eversion: 5/5 Inversion: 5/5  Leg strength  Quad: 5/5 Hamstring: 5/5 Hip flexor: 5/5 Hip abductors: 5/5  Gait unremarkable.   Osteopathic findings  C2 flexed rotated and side bent right C4 flexed rotated and side bent left T2 extended rotated and side bent right inhaled rib T3 extended rotated and side bent left L5 flexed rotated and side bent right Sacrum right on right    Impression and Recommendations:     This case required medical decision making of moderate complexity. The above documentation has been  reviewed and is accurate and complete Carrie Pulley, DO       Note: This dictation was prepared with Dragon dictation along with smaller phrase technology. Any transcriptional errors that result from this process are unintentional.

## 2018-07-26 NOTE — Assessment & Plan Note (Signed)
Decision today to treat with OMT was based on Physical Exam  After verbal consent patient was treated with HVLA, ME, FPR techniques in cervical, thoracic, rib, lumbar and sacral areas  Patient tolerated the procedure well with improvement in symptoms  Patient given exercises, stretches and lifestyle modifications  See medications in patient instructions if given  Patient will follow up in 6 weeks 

## 2018-08-05 ENCOUNTER — Other Ambulatory Visit: Payer: Self-pay | Admitting: Family Medicine

## 2018-08-30 ENCOUNTER — Encounter: Payer: Self-pay | Admitting: Family Medicine

## 2018-09-06 NOTE — Progress Notes (Signed)
[name] [place], [place] [address] Phone: [phone] Subjective:   Carrie [name], am serving as a scribe for Dr. Hulan [name].   CC: Back and hip and shoulder pain  QQI:[id number]  Carrie [name] is a 47 y.o. female coming in with complaint of back, hip, shoulder pain. She is looking to get an adjustment today.  Went hiking recently and she has had medial heel pain since then. Has been having a lot of foot cramps. Is wearing a brace for support. Pain in morning and evening. Increase in pain with weight bearing. Using advil to alleviate pain. Has been doing calf stretches and ankle strengthening. Has used ice as well. Pain is sharp.        [name] past medical history on file. [name] past surgical history on file. Social History   Socioeconomic History  . Marital status: Married    Spouse name: Not on file  . Number of children: Not on file  . Years of education: Not on file  . Highest education level: Not on file  Occupational History  . Not on file  Social Needs  . Financial resource strain: Not on file  . Food insecurity:    Worry: Not on file    Inability: Not on file  . Transportation needs:    Medical: Not on file    Non-medical: Not on file  Tobacco Use  . Smoking status: Former Research scientist (life sciences)  . Smokeless tobacco: Never Used  Substance and Sexual Activity  . Alcohol use: Not on file  . Drug use: Not on file  . Sexual activity: Not on file  Lifestyle  . Physical activity:    Days per week: Not on file    Minutes per session: Not on file  . Stress: Not on file  Relationships  . Social connections:    Talks on phone: Not on file    Gets together: Not on file    Attends religious service: Not on file    Active member of club or organization: Not on file    Attends meetings of clubs or organizations: Not on file    Relationship status: Not on file  Other Topics Concern  . Not on file  Social History Narrative  . Not  on file   [name] Known Allergies [name] family history on file.  Current Outpatient Medications (Endocrine & Metabolic):  .  levothyroxine (SYNTHROID, LEVOTHROID) 100 MCG tablet, Take 100 mcg by mouth daily before breakfast. .  Norethin Ace-Eth Estrad-FE (GILDESS FE 1/20 PO), Take 1 tablet by mouth daily.      Current Outpatient Medications (Other):  Marland Kitchen  ALPRAZolam (XANAX) 0.25 MG tablet, Take 0.25 mg by mouth 3 (three) times daily as needed for anxiety. .  B Complex-C (B-COMPLEX WITH VITAMIN C) tablet, Take 1 tablet by mouth daily. .  Calcium Citrate-Vitamin D (CALCIUM + D PO), Take by mouth 2 (two) times daily. Calcium 1,000 mg and Vit D 3 100 IU .  gabapentin (NEURONTIN) 100 MG capsule, TAKE 2 CAPSULES BY MOUTH 2 TIMES DAILY. Marland Kitchen  gabapentin (NEURONTIN) 300 MG capsule, TAKE 1 CAPSULE BY MOUTH NIGHTLY .  Melatonin 10 MG CAPS, Take 10 mg by mouth. .  venlafaxine XR (EFFEXOR-XR) 75 MG 24 hr capsule, TAKE 1 CAPSULE BY MOUTH DAILY WITH BREAKFAST. Marland Kitchen  vitamin C (ASCORBIC ACID) 500 MG tablet, Take 500 mg by mouth daily.    Past medical history, social, surgical and family history  all reviewed in electronic medical record.  [name] pertanent information unless stated regarding to the chief complaint.   Review of Systems:  [name] headache, visual changes, nausea, vomiting, diarrhea, constipation, dizziness, abdominal pain, skin rash, fevers, chills, night sweats, weight loss, swollen lymph nodes, body aches, joint swelling, muscle aches, chest pain, shortness of breath, mood changes.   Objective  There were [name] vitals taken for this visit. Systems examined below as of    General: [name] apparent distress alert and oriented x3 mood and affect normal, dressed appropriately.  HEENT: Pupils equal, extraocular movements intact  Respiratory: Patient's speak in full sentences and does not appear short of breath  Cardiovascular: [name] lower extremity edema, non tender, [name] erythema  Skin: Warm dry intact with [name] signs of  infection or rash on extremities or on axial skeleton.  Abdomen: Soft nontender  Neuro: Cranial nerves II through XII are intact, neurovascularly intact in all extremities with 2+ DTRs and 2+ pulses.  Lymph: [name] lymphadenopathy of posterior or anterior cervical chain or axillae bilaterally.  Gait normal with good balance and coordination.  MSK:  Non tender with full range of motion and good stability and symmetric strength and tone of shoulders, elbows, wrist, hip, knee and ankles bilaterally.  Left foot exam shows the patient does have some mild tenderness to palpation of the plantar aspect of the heel.  Patient has some mild breakdown of the longitudinal arch.  Full range of motion of the ankle.  Nontender over the talar dome.  Neurovascular intact distally.  Patient's Achilles is tight but [name] tenderness.  Contralateral foot unremarkable  Limited musculoskeletal ultrasound was performed and interpreted by Carrie [name]  Limited ultrasound of patient's left heel shows that patient's plantar fascia was within normal limits.  Patient does have a very small calcaneal bursitis noted.  In addition of this patient does have what appears to be a bone contusion with mild increase in Doppler flow but [name] true cortical defect noted. Impression: Heel contusion  Osteopathic findings  C2 flexed rotated and side bent right C4 flexed rotated and side bent left C6 flexed rotated and side bent left T3 extended rotated and side bent right inhaled third rib T9 extended rotated and side bent left L2 flexed rotated and side bent right Sacrum right on right    Impression and Recommendations:     This case required medical decision making of moderate complexity. The above documentation has been reviewed and is accurate and complete Carrie Pulley, DO       Note: This dictation was prepared with Dragon dictation along with smaller phrase technology. Any transcriptional errors that result from this  process are unintentional.

## 2018-09-07 ENCOUNTER — Ambulatory Visit: Payer: Self-pay

## 2018-09-07 ENCOUNTER — Encounter: Payer: Self-pay | Admitting: Family Medicine

## 2018-09-07 ENCOUNTER — Ambulatory Visit: Payer: BC Managed Care – PPO | Admitting: Family Medicine

## 2018-09-07 VITALS — BP 118/88 | HR 94 | Ht 64.0 in | Wt 186.0 lb

## 2018-09-07 DIAGNOSIS — M79671 Pain in right foot: Secondary | ICD-10-CM

## 2018-09-07 DIAGNOSIS — S9030XA Contusion of unspecified foot, initial encounter: Secondary | ICD-10-CM

## 2018-09-07 DIAGNOSIS — M999 Biomechanical lesion, unspecified: Secondary | ICD-10-CM

## 2018-09-07 NOTE — Assessment & Plan Note (Signed)
Heel contusion.  Discussed icing regimen and home exercises.  Discussed he will donate and proper shoes.  We discussed that I do not feel it is more of a plantar fasciitis.  Discussed which activities.  Patient will follow-up with me again in 4 weeks if no improvement will consider potential injection.  No need for x-rays

## 2018-09-07 NOTE — Assessment & Plan Note (Signed)
Decision today to treat with OMT was based on Physical Exam  After verbal consent patient was treated with HVLA, ME, FPR techniques in cervical, thoracic, rib, lumbar and sacral areas  Patient tolerated the procedure well with improvement in symptoms  Patient given exercises, stretches and lifestyle modifications  See medications in patient instructions if given  Patient will follow up in 6 weeks 

## 2018-09-07 NOTE — Patient Instructions (Addendum)
Good to see you again! Heel contusion  Arnica lotion 2 times a day  Avoid being barefoot Rigid sole shoes Heel donut daily  See me again in 4 weeks

## 2018-10-05 NOTE — Progress Notes (Signed)
[name] [place], [place] [address] Phone: [phone] Subjective:   Carrie [name], am serving as a scribe for Dr. Hulan Saas.  I'm seeing this patient by the request  of:    CC: Back pain  QMG:[id number]  Carrie [name] is a 47 y.o. female coming in with complaint of back pain. [name] change in her back since last visit and foot pain has subsided. She did wear a doughnut which helped. Is using insoles in her shoes.  Has responded well to osteopathic manipulation.  Patient does note some tightness recently.  Some mild increase in stress.  Doing well with medications.       [name] past medical history on file. [name] past surgical history on file. Social History   Socioeconomic History  . Marital status: Married    Spouse name: Not on file  . Number of children: Not on file  . Years of education: Not on file  . Highest education level: Not on file  Occupational History  . Not on file  Social Needs  . Financial resource strain: Not on file  . Food insecurity:    Worry: Not on file    Inability: Not on file  . Transportation needs:    Medical: Not on file    Non-medical: Not on file  Tobacco Use  . Smoking status: Former Research scientist (life sciences)  . Smokeless tobacco: Never Used  Substance and Sexual Activity  . Alcohol use: Not on file  . Drug use: Not on file  . Sexual activity: Not on file  Lifestyle  . Physical activity:    Days per week: Not on file    Minutes per session: Not on file  . Stress: Not on file  Relationships  . Social connections:    Talks on phone: Not on file    Gets together: Not on file    Attends religious service: Not on file    Active member of club or organization: Not on file    Attends meetings of clubs or organizations: Not on file    Relationship status: Not on file  Other Topics Concern  . Not on file  Social History Narrative  . Not on file   [name] Known Allergies [name] family history on file.  Current  Outpatient Medications (Endocrine & Metabolic):  .  levothyroxine (SYNTHROID, LEVOTHROID) 100 MCG tablet, Take 100 mcg by mouth daily before breakfast. .  Norethin Ace-Eth Estrad-FE (GILDESS FE 1/20 PO), Take 1 tablet by mouth daily.      Current Outpatient Medications (Other):  Marland Kitchen  ALPRAZolam (XANAX) 0.25 MG tablet, Take 0.25 mg by mouth 3 (three) times daily as needed for anxiety. .  B Complex-C (B-COMPLEX WITH VITAMIN C) tablet, Take 1 tablet by mouth daily. .  Calcium Citrate-Vitamin D (CALCIUM + D PO), Take by mouth 2 (two) times daily. Calcium 1,000 mg and Vit D 3 100 IU .  gabapentin (NEURONTIN) 100 MG capsule, TAKE 2 CAPSULES BY MOUTH 2 TIMES DAILY. Marland Kitchen  gabapentin (NEURONTIN) 300 MG capsule, TAKE 1 CAPSULE BY MOUTH NIGHTLY .  Melatonin 10 MG CAPS, Take 10 mg by mouth. .  venlafaxine XR (EFFEXOR-XR) 75 MG 24 hr capsule, TAKE 1 CAPSULE BY MOUTH DAILY WITH BREAKFAST. Marland Kitchen  vitamin C (ASCORBIC ACID) 500 MG tablet, Take 500 mg by mouth daily.    Past medical history, social, surgical and family history all reviewed in electronic medical record.  [name] pertanent information unless stated  regarding to the chief complaint.   Review of Systems:  [name] headache, visual changes, nausea, vomiting, diarrhea, constipation, dizziness, abdominal pain, skin rash, fevers, chills, night sweats, weight loss, swollen lymph nodes, body aches, joint swelling,  chest pain, shortness of breath, mood changes.  Positive muscle aches  Objective  Blood pressure 126/78, pulse (!) 127, height 5\' 4"  (1.626 m), weight 189 lb (85.7 kg), SpO2 98 %.   General: [name] apparent distress alert and oriented x3 mood and affect normal, dressed appropriately.  HEENT: Pupils equal, extraocular movements intact  Respiratory: Patient's speak in full sentences and does not appear short of breath  Cardiovascular: [name] lower extremity edema, non tender, [name] erythema  Skin: Warm dry intact with [name] signs of infection or rash on extremities or  on axial skeleton.  Abdomen: Soft nontender  Neuro: Cranial nerves II through XII are intact, neurovascularly intact in all extremities with 2+ DTRs and 2+ pulses.  Lymph: [name] lymphadenopathy of posterior or anterior cervical chain or axillae bilaterally.  Gait normal with good balance and coordination.  MSK:  Non tender with full range of motion and good stability and symmetric strength and tone of shoulders, elbows, wrist, hip, knee and ankles bilaterally.  Back Exam:  Inspection: Unremarkable  Motion: Flexion 35 deg, Extension 20 deg, Side Bending to 35 deg bilaterally,  Rotation to 35 deg bilaterally  SLR laying: Negative  XSLR laying: Negative  Palpable tenderness: Tender to palpation the paraspinal musculature lumbar spine right greater than left. FABER: Positive Faber. Sensory change: Gross sensation intact to all lumbar and sacral dermatomes.  Reflexes: 2+ at both patellar tendons, 2+ at achilles tendons, Babinski's downgoing.  Strength at foot  Plantar-flexion: 5/5 Dorsi-flexion: 5/5 Eversion: 5/5 Inversion: 5/5  Leg strength  Quad: 5/5 Hamstring: 5/5 Hip flexor: 5/5 Hip abductors: 5/5  Gait unremarkable.    Osteopathic findings C2 flexed rotated and side bent right C7 flexed rotated and side bent left T3 extended rotated and side bent right inhaled third rib T6 extended rotated and side bent left L2 flexed rotated and side bent right Sacrum right on right    Impression and Recommendations:     This case required medical decision making of moderate complexity. The above documentation has been reviewed and is accurate and complete [name], DO       Note: This dictation was prepared with Dragon dictation along with smaller phrase technology. Any transcriptional errors that result from this process are unintentional.

## 2018-10-06 ENCOUNTER — Ambulatory Visit: Payer: BC Managed Care – PPO | Admitting: Family Medicine

## 2018-10-06 ENCOUNTER — Encounter: Payer: Self-pay | Admitting: Family Medicine

## 2018-10-06 VITALS — BP 126/78 | HR 127 | Ht 64.0 in | Wt 189.0 lb

## 2018-10-06 DIAGNOSIS — M999 Biomechanical lesion, unspecified: Secondary | ICD-10-CM

## 2018-10-06 DIAGNOSIS — M7602 Gluteal tendinitis, left hip: Secondary | ICD-10-CM

## 2018-10-06 NOTE — Assessment & Plan Note (Signed)
Decision today to treat with OMT was based on Physical Exam  After verbal consent patient was treated with HVLA, ME, FPR techniques in cervical, thoracic, rib lumbar and sacral areas  Patient tolerated the procedure well with improvement in symptoms  Patient given exercises, stretches and lifestyle modifications  See medications in patient instructions if given  Patient will follow up in 6-8 weeks 

## 2018-10-06 NOTE — Assessment & Plan Note (Signed)
Patient has more gluteal tenderness and sacroiliac dysfunction.  Responds well to manipulation discussed icing regimen, core, discussed ergonomics throughout the day.  Continue the same medications.  Follow-up again in 4 to 6 weeks

## 2018-10-06 NOTE — Patient Instructions (Signed)
Good to see you  Ice is your friend Ice 20 minutes 2 times daily. Usually after activity and before bed. Stay active You know the drill  Happy holidays!  See me again in 6-8 weeks

## 2018-10-10 ENCOUNTER — Other Ambulatory Visit: Payer: Self-pay | Admitting: Family Medicine

## 2018-10-10 NOTE — Telephone Encounter (Signed)
Refill done.  

## 2018-11-08 ENCOUNTER — Other Ambulatory Visit: Payer: Self-pay | Admitting: Family Medicine

## 2018-11-16 ENCOUNTER — Ambulatory Visit: Payer: Self-pay

## 2018-11-16 ENCOUNTER — Ambulatory Visit (INDEPENDENT_AMBULATORY_CARE_PROVIDER_SITE_OTHER): Payer: BC Managed Care – PPO | Admitting: Family Medicine

## 2018-11-16 DIAGNOSIS — G8929 Other chronic pain: Secondary | ICD-10-CM

## 2018-11-16 DIAGNOSIS — M25519 Pain in unspecified shoulder: Secondary | ICD-10-CM

## 2018-11-16 DIAGNOSIS — M25511 Pain in right shoulder: Secondary | ICD-10-CM | POA: Diagnosis not present

## 2018-11-16 DIAGNOSIS — M999 Biomechanical lesion, unspecified: Secondary | ICD-10-CM

## 2018-11-16 DIAGNOSIS — M7602 Gluteal tendinitis, left hip: Secondary | ICD-10-CM

## 2018-11-16 NOTE — Assessment & Plan Note (Signed)
Trigger points injected today.  Discussed icing regimen and home exercise.  Discussed which activities to do which wants to avoid.  Patient is to increase activity slowly over the course of next several days.  Discussed icing regimen.  Follow-up again in 4 to 8 weeks.

## 2018-11-16 NOTE — Patient Instructions (Signed)
Great to see you  Carrie [name] is your friend Stay active Did triger point injections  See me again in 6ish weeks

## 2018-11-16 NOTE — Progress Notes (Signed)
[name] [place], [place] [address] Phone: [phone] Subjective:      CC: Right shoulder and neck pain follow-up  [id number]  Carrie [name] is a 48 y.o. female coming in with complaint of right shoulder and neck pain.  Has been seen previously patient noticed on that is having more of the trigger points again.  Right shoulder blade.  States hurts when she moves her neck sometimes.  Rates the severity of pain is 8 out of 10.     Low back pain.  Has responded well to manipulation in the past.  Rates the severity of pain is 7 out of 10.  Not stopping her from activities but somewhat uncomfortable recently.  No past medical history on file. No past surgical history on file. Social History   Socioeconomic History  . Marital status: Married    Spouse name: Not on file  . Number of children: Not on file  . Years of education: Not on file  . Highest education level: Not on file  Occupational History  . Not on file  Social Needs  . Financial resource strain: Not on file  . Food insecurity:    Worry: Not on file    Inability: Not on file  . Transportation needs:    Medical: Not on file    Non-medical: Not on file  Tobacco Use  . Smoking status: Former Research scientist (life sciences)  . Smokeless tobacco: Never Used  Substance and Sexual Activity  . Alcohol use: Not on file  . Drug use: Not on file  . Sexual activity: Not on file  Lifestyle  . Physical activity:    Days per week: Not on file    Minutes per session: Not on file  . Stress: Not on file  Relationships  . Social connections:    Talks on phone: Not on file    Gets together: Not on file    Attends religious service: Not on file    Active member of club or organization: Not on file    Attends meetings of clubs or organizations: Not on file    Relationship status: Not on file  Other Topics Concern  . Not on file  Social History Narrative  . Not on file   No Known Allergies No  family history on file.  Current Outpatient Medications (Endocrine & Metabolic):  .  levothyroxine (SYNTHROID, LEVOTHROID) 100 MCG tablet, Take 100 mcg by mouth daily before breakfast. .  Norethin Ace-Eth Estrad-FE (GILDESS FE 1/20 PO), Take 1 tablet by mouth daily.      Current Outpatient Medications (Other):  Marland Kitchen  ALPRAZolam (XANAX) 0.25 MG tablet, Take 0.25 mg by mouth 3 (three) times daily as needed for anxiety. .  B Complex-C (B-COMPLEX WITH VITAMIN C) tablet, Take 1 tablet by mouth daily. .  Calcium Citrate-Vitamin D (CALCIUM + D PO), Take by mouth 2 (two) times daily. Calcium 1,000 mg and Vit D 3 100 IU .  gabapentin (NEURONTIN) 100 MG capsule, TAKE 2 CAPSULES BY MOUTH 2 TIMES A DAY .  gabapentin (NEURONTIN) 300 MG capsule, TAKE 1 CAPSULE BY MOUTH NIGHTLY .  Melatonin 10 MG CAPS, Take 10 mg by mouth. .  venlafaxine XR (EFFEXOR-XR) 75 MG 24 hr capsule, TAKE 1 CAPSULE BY MOUTH DAILY WITH BREAKFAST. Marland Kitchen  vitamin C (ASCORBIC ACID) 500 MG tablet, Take 500 mg by mouth daily.    Past medical history, social, surgical and family history all reviewed in electronic  medical record.  No pertanent information unless stated regarding to the chief complaint.   Review of Systems:  No headache, visual changes, nausea, vomiting, diarrhea, constipation, dizziness, abdominal pain, skin rash, fevers, chills, night sweats, weight loss, swollen lymph nodes, body aches, joint swelling, muscle aches, chest pain, shortness of breath, mood changes.   Objective   General: No apparent distress alert and oriented x3 mood and affect normal, dressed appropriately.  HEENT: Pupils equal, extraocular movements intact  Respiratory: Patient's speak in full sentences and does not appear short of breath  Cardiovascular: No lower extremity edema, non tender, no erythema  Skin: Warm dry intact with no signs of infection or rash on extremities or on axial skeleton.  Abdomen: Soft nontender  Neuro: Cranial nerves II  through XII are intact, neurovascularly intact in all extremities with 2+ DTRs and 2+ pulses.  Lymph: No lymphadenopathy of posterior or anterior cervical chain or axillae bilaterally.  Gait normal with good balance and coordination.  MSK:  Non tender with full range of motion and good stability and symmetric strength and tone of shoulders, elbows, wrist, hip, knee and ankles bilaterally.  Back Exam:  Inspection: Loss of lordosis Motion: Flexion 45 deg, Extension 25 deg, Side Bending to 35 deg bilaterally, Rotation to 35 deg bilaterally  SLR laying: Negative  XSLR laying: Negative  Palpable tenderness: Tender to palpation in the paraspinal musculature more around the left sacroiliac joint. FABER: tightness left. Sensory change: Gross sensation intact to all lumbar and sacral dermatomes.  Reflexes: 2+ at both patellar tendons, 2+ at achilles tendons, Babinski's downgoing.  Strength at foot  Plantar-flexion: 5/5 Dorsi-flexion: 5/5 Eversion: 5/5 Inversion: 5/5  Leg strength  Quad: 5/5 Hamstring: 5/5 Hip flexor: 5/5 Hip abductors: 4/5 but symmetric Gait unremarkable.  Multiple trigger points noted in the left shoulder girdle region.  Patient has near full range of motion of the shoulder otherwise.  Mild tightness in the neck noted  After verbal consent patient was prepped with alcohol swabs and with a 25-gauge half inch needle injected with 0.5 cc of 0.5% Marcaine and 1 cc of Kenalog 40 mg/mL and 3 distinct trigger points.  Minimal blood loss.  Postinjection instructions given.  Band-Aids placed  Osteopathic findings  C6 flexed rotated and side bent right  T4 extended rotated and side bent right inhaled rib T9 extended rotated and side bent left L5 flexed rotated and side bent left  Sacrum right on right    Impression and Recommendations:     This case required medical decision making of moderate complexity. The above documentation has been reviewed and is accurate and complete [name], DO       Note: This dictation was prepared with Dragon dictation along with smaller phrase technology. Any transcriptional errors that result from this process are unintentional.

## 2018-11-16 NOTE — Assessment & Plan Note (Signed)
Discussed which activities to do which wants to avoid.  Discussed hip abductor strengthening.  Discussed core strengthening.  Follow-up again in 4 to 8 weeks

## 2018-11-16 NOTE — Assessment & Plan Note (Signed)
Decision today to treat with OMT was based on Physical Exam  After verbal consent patient was treated with HVLA, ME, FPR techniques in cervical, thoracic, rib lumbar and sacral areas  Patient tolerated the procedure well with improvement in symptoms  Patient given exercises, stretches and lifestyle modifications  See medications in patient instructions if given  Patient will follow up in 4-8 weeks 

## 2018-11-17 ENCOUNTER — Ambulatory Visit: Payer: BC Managed Care – PPO | Admitting: Family Medicine

## 2018-12-27 NOTE — Progress Notes (Signed)
[name] [place], [place] [address] Phone: [phone] Subjective:       CC: Neck and back pain follow-up  [id number]   11/16/2018: Trigger points injected today.  Discussed icing regimen and home exercise.  Discussed which activities to do which wants to avoid.  Patient is to increase activity slowly over the course of next several days.  Discussed icing regimen.  Follow-up again in 4 to 8 weeks.  Discussed which activities to do which wants to avoid.  Discussed hip abductor strengthening.  Discussed core strengthening.  Follow-up again in 4 to 8 weeks Carrie [name] is a 48 y.o. female coming in with complaint of right shoulder and hip pain. Patient states that her shoulder is not better since last visit. Feels a constant pinch in her shoulder and back. Denies any radiating symptoms.   Her left hip pain has decreased since last visit. Patient is happy with progress.    No past medical history on file. No past surgical history on file. Social History   Socioeconomic History  . Marital status: Married    Spouse name: Not on file  . Number of children: Not on file  . Years of education: Not on file  . Highest education level: Not on file  Occupational History  . Not on file  Social Needs  . Financial resource strain: Not on file  . Food insecurity:    Worry: Not on file    Inability: Not on file  . Transportation needs:    Medical: Not on file    Non-medical: Not on file  Tobacco Use  . Smoking status: Former Research scientist (life sciences)  . Smokeless tobacco: Never Used  Substance and Sexual Activity  . Alcohol use: Not on file  . Drug use: Not on file  . Sexual activity: Not on file  Lifestyle  . Physical activity:    Days per week: Not on file    Minutes per session: Not on file  . Stress: Not on file  Relationships  . Social connections:    Talks on phone: Not on file    Gets together: Not on file    Attends religious service: Not  on file    Active member of club or organization: Not on file    Attends meetings of clubs or organizations: Not on file    Relationship status: Not on file  Other Topics Concern  . Not on file  Social History Narrative  . Not on file   No Known Allergies No family history on file.  Current Outpatient Medications (Endocrine & Metabolic):  .  levothyroxine (SYNTHROID, LEVOTHROID) 112 MCG tablet, Take 112 mcg by mouth daily before breakfast. .  Norethin Ace-Eth Estrad-FE (GILDESS FE 1/20 PO), Take 1 tablet by mouth daily.      Current Outpatient Medications (Other):  Marland Kitchen  ALPRAZolam (XANAX) 0.25 MG tablet, Take 0.25 mg by mouth 3 (three) times daily as needed for anxiety. .  B Complex-C (B-COMPLEX WITH VITAMIN C) tablet, Take 1 tablet by mouth daily. .  Calcium Citrate-Vitamin D (CALCIUM + D PO), Take by mouth 2 (two) times daily. Calcium 1,000 mg and Vit D 3 100 IU .  gabapentin (NEURONTIN) 100 MG capsule, TAKE 2 CAPSULES BY MOUTH 2 TIMES A DAY .  gabapentin (NEURONTIN) 300 MG capsule, TAKE 1 CAPSULE BY MOUTH NIGHTLY .  Melatonin 10 MG CAPS, Take 10 mg by mouth. .  venlafaxine XR (EFFEXOR-XR) 75 MG 24  hr capsule, TAKE 1 CAPSULE BY MOUTH DAILY WITH BREAKFAST. Marland Kitchen  vitamin C (ASCORBIC ACID) 500 MG tablet, Take 500 mg by mouth daily.    Past medical history, social, surgical and family history all reviewed in electronic medical record.  No pertanent information unless stated regarding to the chief complaint.   Review of Systems:  No headache, visual changes, nausea, vomiting, diarrhea, constipation, dizziness, abdominal pain, skin rash, fevers, chills, night sweats, weight loss, swollen lymph nodes, body aches, joint swelling, muscle aches, chest pain, shortness of breath, mood changes.   Objective  Blood pressure 122/82, pulse 79, height 5\' 4"  (1.626 m), weight 172 lb (78 kg), SpO2 99 %.    General: No apparent distress alert and oriented x3 mood and affect normal, dressed  appropriately.  HEENT: Pupils equal, extraocular movements intact  Respiratory: Patient's speak in full sentences and does not appear short of breath  Cardiovascular: No lower extremity edema, non tender, no erythema  Skin: Warm dry intact with no signs of infection or rash on extremities or on axial skeleton.  Abdomen: Soft nontender  Neuro: Cranial nerves II through XII are intact, neurovascularly intact in all extremities with 2+ DTRs and 2+ pulses.  Lymph: No lymphadenopathy of posterior or anterior cervical chain or axillae bilaterally.  Gait normal with good balance and coordination.  MSK:  Non tender with full range of motion and good stability and symmetric strength and tone of shoulders, elbows, wrist, hip, knee and ankles bilaterally.  Neck: Inspection unremarkable. No palpable stepoffs. Negative Spurling's maneuver. Full neck range of motion Grip strength and sensation normal in bilateral hands Strength good C4 to T1 distribution No sensory change to C4 to T1 Negative Hoffman sign bilaterally Reflexes normal  Right shoulder exam shows the patient does have tenderness to palpation around the trapezius on the right side.  Patient does have full range of motion of the shoulder.  Multiple trigger points in the right shoulder noted again.  Osteopathic findings  C5  flexed rotated and side bent right  T3 extended rotated and side bent right inhaled third rib T7 extended rotated and side bent left L2 flexed rotated and side bent right Sacrum right on right    Impression and Recommendations:     This case required medical decision making of moderate complexity. The above documentation has been reviewed and is accurate and complete [name], DO       Note: This dictation was prepared with Dragon dictation along with smaller phrase technology. Any transcriptional errors that result from this process are unintentional.

## 2018-12-28 ENCOUNTER — Encounter: Payer: Self-pay | Admitting: Family Medicine

## 2018-12-28 ENCOUNTER — Ambulatory Visit: Payer: BC Managed Care – PPO | Admitting: Family Medicine

## 2018-12-28 VITALS — BP 122/82 | HR 79 | Ht 64.0 in | Wt 172.0 lb

## 2018-12-28 DIAGNOSIS — M25511 Pain in right shoulder: Secondary | ICD-10-CM

## 2018-12-28 DIAGNOSIS — M999 Biomechanical lesion, unspecified: Secondary | ICD-10-CM

## 2018-12-28 NOTE — Patient Instructions (Signed)
Good to see you  Ice might be better Watch the posture See me again in 4-5 weeks

## 2018-12-28 NOTE — Assessment & Plan Note (Signed)
Decision today to treat with OMT was based on Physical Exam  After verbal consent patient was treated with HVLA, ME, FPR techniques in cervical, thoracic, rib, lumbar and sacral areas  Patient tolerated the procedure well with improvement in symptoms  Patient given exercises, stretches and lifestyle modifications  See medications in patient instructions if given  Patient will follow up in 4-5 weeks 

## 2018-12-28 NOTE — Assessment & Plan Note (Signed)
Patient continues have tightness of the shoulder blade.  We discussed the possibility of advanced imaging.  Patient will does respond well to manipulation and hopefully will last longer.  Patient will follow-up with me again in 4 to 5 weeks.

## 2019-02-01 ENCOUNTER — Ambulatory Visit: Payer: BC Managed Care – PPO | Admitting: Family Medicine

## 2019-02-06 ENCOUNTER — Other Ambulatory Visit: Payer: Self-pay | Admitting: Family Medicine

## 2019-02-06 NOTE — Telephone Encounter (Signed)
Refill done.  

## 2019-02-21 ENCOUNTER — Ambulatory Visit: Payer: BC Managed Care – PPO | Admitting: Family Medicine

## 2019-03-15 ENCOUNTER — Encounter: Payer: Self-pay | Admitting: Family Medicine

## 2019-03-15 ENCOUNTER — Ambulatory Visit: Payer: BC Managed Care – PPO | Admitting: Family Medicine

## 2019-03-15 ENCOUNTER — Other Ambulatory Visit: Payer: Self-pay

## 2019-03-15 VITALS — BP 130/84 | HR 79 | Ht 64.0 in | Wt 162.0 lb

## 2019-03-15 DIAGNOSIS — G5702 Lesion of sciatic nerve, left lower limb: Secondary | ICD-10-CM | POA: Diagnosis not present

## 2019-03-15 DIAGNOSIS — M999 Biomechanical lesion, unspecified: Secondary | ICD-10-CM | POA: Diagnosis not present

## 2019-03-15 NOTE — Assessment & Plan Note (Signed)
Decision today to treat with OMT was based on Physical Exam  After verbal consent patient was treated with HVLA, ME, FPR techniques in cervical, thoracic, rib lumbar and sacral areas  Patient tolerated the procedure well with improvement in symptoms  Patient given exercises, stretches and lifestyle modifications  See medications in patient instructions if given  Patient will follow up in 6 weeks 

## 2019-03-15 NOTE — Assessment & Plan Note (Signed)
Mild tightness in the lower back again.  Some spasm in the left piriformis.  Responding well to to osteopathic manipulation.  No significant changes in management.  Patient has been working at home on a more regular basis.  Follow-up with me again 6 weeks

## 2019-03-15 NOTE — Progress Notes (Signed)
[name] [place], [place] [address] Phone: [phone] Subjective:   I [name] am serving as a Education administrator for Dr. Hulan Saas.  I'm seeing this patient by the request  of:    CC: Back and neck pain follow-up  [id number]  Carrie [name] is a 48 y.o. female coming in with complaint of back pain. Shoulder giving her some issues.  Patient has been seen previously.  Has responded well to manipulation previously.  Regard pain seems to be in decent.  Patient has been trying to lose weight.  Doing well and has lost 22 pounds since previous visit.  States still some tightness in the upper back.       No past medical history on file. No past surgical history on file. Social History   Socioeconomic History  . Marital status: Married    Spouse name: Not on file  . Number of children: Not on file  . Years of education: Not on file  . Highest education level: Not on file  Occupational History  . Not on file  Social Needs  . Financial resource strain: Not on file  . Food insecurity:    Worry: Not on file    Inability: Not on file  . Transportation needs:    Medical: Not on file    Non-medical: Not on file  Tobacco Use  . Smoking status: Former Research scientist (life sciences)  . Smokeless tobacco: Never Used  Substance and Sexual Activity  . Alcohol use: Not on file  . Drug use: Not on file  . Sexual activity: Not on file  Lifestyle  . Physical activity:    Days per week: Not on file    Minutes per session: Not on file  . Stress: Not on file  Relationships  . Social connections:    Talks on phone: Not on file    Gets together: Not on file    Attends religious service: Not on file    Active member of club or organization: Not on file    Attends meetings of clubs or organizations: Not on file    Relationship status: Not on file  Other Topics Concern  . Not on file  Social History Narrative  . Not on file   No Known Allergies No family  history on file.  Current Outpatient Medications (Endocrine & Metabolic):  .  levothyroxine (SYNTHROID, LEVOTHROID) 112 MCG tablet, Take 112 mcg by mouth daily before breakfast. .  Norethin Ace-Eth Estrad-FE (GILDESS FE 1/20 PO), Take 1 tablet by mouth daily.      Current Outpatient Medications (Other):  Marland Kitchen  ALPRAZolam (XANAX) 0.25 MG tablet, Take 0.25 mg by mouth 3 (three) times daily as needed for anxiety. .  B Complex-C (B-COMPLEX WITH VITAMIN C) tablet, Take 1 tablet by mouth daily. .  Calcium Citrate-Vitamin D (CALCIUM + D PO), Take by mouth 2 (two) times daily. Calcium 1,000 mg and Vit D 3 100 IU .  gabapentin (NEURONTIN) 100 MG capsule, TAKE 2 CAPSULES BY MOUTH 2 TIMES A DAY .  gabapentin (NEURONTIN) 300 MG capsule, TAKE 1 CAPSULE BY MOUTH NIGHTLY .  Melatonin 10 MG CAPS, Take 10 mg by mouth. .  venlafaxine XR (EFFEXOR-XR) 75 MG 24 hr capsule, TAKE 1 CAPSULE BY MOUTH DAILY WITH BREAKFAST. Marland Kitchen  vitamin C (ASCORBIC ACID) 500 MG tablet, Take 500 mg by mouth daily.    Past medical history, social, surgical and family history all reviewed in electronic medical  record.  No pertanent information unless stated regarding to the chief complaint.   Review of Systems:  No headache, visual changes, nausea, vomiting, diarrhea, constipation, dizziness, abdominal pain, skin rash, fevers, chills, night sweats, weight loss, swollen lymph nodes, body aches, joint swelling, muscle aches, chest pain, shortness of breath, mood changes.   Objective  There were no vitals taken for this visit. Systems examined below as of    General: No apparent distress alert and oriented x3 mood and affect normal, dressed appropriately.  HEENT: Pupils equal, extraocular movements intact  Respiratory: Patient's speak in full sentences and does not appear short of breath  Cardiovascular: No lower extremity edema, non tender, no erythema  Skin: Warm dry intact with no signs of infection or rash on extremities or on  axial skeleton.  Abdomen: Soft nontender  Neuro: Cranial nerves II through XII are intact, neurovascularly intact in all extremities with 2+ DTRs and 2+ pulses.  Lymph: No lymphadenopathy of posterior or anterior cervical chain or axillae bilaterally.  Gait normal with good balance and coordination.  MSK:  Non tender with full range of motion and good stability and symmetric strength and tone of shoulders, elbows, wrist, hip, knee and ankles bilaterally.  Neck: Inspection unremarkable. No palpable stepoffs. Negative Spurling's maneuver. Full neck range of motion Grip strength and sensation normal in bilateral hands Strength good C4 to T1 distribution No sensory change to C4 to T1 Negative Hoffman sign bilaterally Reflexes normal  Back Exam:  Inspection: Unremarkable  Motion: Flexion 45 deg, Extension 45 deg, Side Bending to 45 deg bilaterally,  Rotation to 45 deg bilaterally  SLR laying: Negative  XSLR laying: Negative  Palpable tenderness: None. FABER: Tightness left side. Sensory change: Gross sensation intact to all lumbar and sacral dermatomes.  Reflexes: 2+ at both patellar tendons, 2+ at achilles tendons, Babinski's downgoing.  Strength at foot  Plantar-flexion: 5/5 Dorsi-flexion: 5/5 Eversion: 5/5 Inversion: 5/5  Leg strength  Quad: 5/5 Hamstring: 5/5 Hip flexor: 5/5 Hip abductors: 5/5  Gait unremarkable.    Osteopathic findings  C4 flexed rotated and side bent left C6 flexed rotated and side bent left T3 extended rotated and side bent right inhaled third rib T9 extended rotated and side bent left L2 flexed rotated and side bent right Sacrum left on left    Impression and Recommendations:     This case required medical decision making of moderate complexity. The above documentation has been reviewed and is accurate and complete [name], DO       Note: This dictation was prepared with Dragon dictation along with smaller phrase technology. Any  transcriptional errors that result from this process are unintentional.

## 2019-04-10 ENCOUNTER — Other Ambulatory Visit: Payer: Self-pay | Admitting: Family Medicine

## 2019-04-26 ENCOUNTER — Other Ambulatory Visit: Payer: Self-pay

## 2019-04-26 ENCOUNTER — Ambulatory Visit: Payer: BC Managed Care – PPO | Admitting: Family Medicine

## 2019-04-26 ENCOUNTER — Encounter: Payer: Self-pay | Admitting: Family Medicine

## 2019-04-26 VITALS — BP 110/80 | HR 105 | Ht 64.0 in | Wt 162.0 lb

## 2019-04-26 DIAGNOSIS — M999 Biomechanical lesion, unspecified: Secondary | ICD-10-CM | POA: Diagnosis not present

## 2019-04-26 DIAGNOSIS — G5702 Lesion of sciatic nerve, left lower limb: Secondary | ICD-10-CM

## 2019-04-26 NOTE — Progress Notes (Signed)
[name] [place], [place] [address] Phone: [phone] Subjective:   Carrie [name], am serving as a scribe for Dr. Hulan Saas.  CC: back and neck pain   [id number]  Carrie [name] is a 48 y.o. female coming in with complaint of back pain. Patient states that she is doing slightly better. Not in as much pain as usual.      [name] past medical history on file. [name] past surgical history on file. Social History   Socioeconomic History  . Marital status: Married    Spouse name: Not on file  . Number of children: Not on file  . Years of education: Not on file  . Highest education level: Not on file  Occupational History  . Not on file  Social Needs  . Financial resource strain: Not on file  . Food insecurity    Worry: Not on file    Inability: Not on file  . Transportation needs    Medical: Not on file    Non-medical: Not on file  Tobacco Use  . Smoking status: Former Research scientist (life sciences)  . Smokeless tobacco: Never Used  Substance and Sexual Activity  . Alcohol use: Not on file  . Drug use: Not on file  . Sexual activity: Not on file  Lifestyle  . Physical activity    Days per week: Not on file    Minutes per session: Not on file  . Stress: Not on file  Relationships  . Social Herbalist on phone: Not on file    Gets together: Not on file    Attends religious service: Not on file    Active member of club or organization: Not on file    Attends meetings of clubs or organizations: Not on file    Relationship status: Not on file  Other Topics Concern  . Not on file  Social History Narrative  . Not on file   [name] Known Allergies [name] family history on file.  Current Outpatient Medications (Endocrine & Metabolic):  .  levothyroxine (SYNTHROID, LEVOTHROID) 112 MCG tablet, Take 112 mcg by mouth daily before breakfast. .  Norethin Ace-Eth Estrad-FE (GILDESS FE 1/20 PO), Take 1 tablet by mouth daily.      Current  Outpatient Medications (Other):  Marland Kitchen  ALPRAZolam (XANAX) 0.25 MG tablet, Take 0.25 mg by mouth 3 (three) times daily as needed for anxiety. .  B Complex-C (B-COMPLEX WITH VITAMIN C) tablet, Take 1 tablet by mouth daily. .  Calcium Citrate-Vitamin D (CALCIUM + D PO), Take by mouth 2 (two) times daily. Calcium 1,000 mg and Vit D 3 100 IU .  gabapentin (NEURONTIN) 100 MG capsule, TAKE 2 CAPSULES BY MOUTH 2 TIMES A DAY .  gabapentin (NEURONTIN) 300 MG capsule, TAKE 1 CAPSULE BY MOUTH NIGHTLY .  Melatonin 10 MG CAPS, Take 10 mg by mouth. .  venlafaxine XR (EFFEXOR-XR) 75 MG 24 hr capsule, TAKE 1 CAPSULE BY MOUTH DAILY WITH BREAKFAST. Marland Kitchen  vitamin C (ASCORBIC ACID) 500 MG tablet, Take 500 mg by mouth daily.    Past medical history, social, surgical and family history all reviewed in electronic medical record.  [name] pertanent information unless stated regarding to the chief complaint.   Review of Systems:  [name] headache, visual changes, nausea, vomiting, diarrhea, constipation, dizziness, abdominal pain, skin rash, fevers, chills, night sweats, weight loss, swollen lymph nodes, body aches, joint swelling,  chest pain, shortness of breath, mood changes.  Positive muscle aches  Objective  Blood pressure 110/80, pulse (!) 105, height 5\' 4"  (1.626 m), weight 162 lb (73.5 kg), SpO2 98 %.    General: [name] apparent distress alert and oriented x3 mood and affect normal, dressed appropriately.  HEENT: Pupils equal, extraocular movements intact  Respiratory: Patient's speak in full sentences and does not appear short of breath  Cardiovascular: [name] lower extremity edema, non tender, [name] erythema  Skin: Warm dry intact with [name] signs of infection or rash on extremities or on axial skeleton.  Abdomen: Soft nontender  Neuro: Cranial nerves II through XII are intact, neurovascularly intact in all extremities with 2+ DTRs and 2+ pulses.  Lymph: [name] lymphadenopathy of posterior or anterior cervical chain or axillae  bilaterally.  Gait normal with good balance and coordination.  MSK:  tender with full range of motion and good stability and symmetric strength and tone of shoulders, elbows, wrist, hip, knee and ankles bilaterally.  Neck: Inspection loss of lordosis. [name] palpable stepoffs. Negative Spurling's maneuver. Limited range of motion. Grip strength and sensation normal in bilateral hands Strength good C4 to T1 distribution [name] sensory change to C4 to T1 Negative Hoffman sign bilaterally Reflexes normal Tightness in the right trapezius  Osteopathic findings C2 flexed rotated and side bent right C6 flexed rotated and side bent left T3 extended rotated and side bent right inhaled third rib T5 extended rotated and side bent left L2 flexed rotated and side bent right Sacrum right on right     Impression and Recommendations:     This case required medical decision making of moderate complexity. The above documentation has been reviewed and is accurate and complete [name], DO       Note: This dictation was prepared with Dragon dictation along with smaller phrase technology. Any transcriptional errors that result from this process are unintentional.

## 2019-04-26 NOTE — Assessment & Plan Note (Signed)
Decision today to treat with OMT was based on Physical Exam  After verbal consent patient was treated with HVLA, ME, FPR techniques in cervical, thoracic, rib,lumbar and sacral areas  Patient tolerated the procedure well with improvement in symptoms  Patient given exercises, stretches and lifestyle modifications  See medications in patient instructions if given  Patient will follow up in 6 weeks

## 2019-04-26 NOTE — Assessment & Plan Note (Signed)
Doing better overall.  Discussed posture and ergonomics.  Patient has been working on that as well.  Not having any headaches.  Making some progress.  Follow-up again in 6 weeks

## 2019-04-26 NOTE — Patient Instructions (Signed)
Keep it up See me again in 6 weeks

## 2019-05-22 NOTE — Progress Notes (Signed)
[name] [place], [place] [address] Phone: [phone] Subjective:   I [name] am serving as a Education administrator for Dr. Hulan Saas.   CC: Low back pain  [id number]  Carrie [name] is a 48 y.o. female coming in with complaint of back pain. States she is doing pretty good.  Mild tightness.  Has not been working out as regularly.  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  Patient denies any radiation down the legs or any numbness or tingling at this time.     No past medical history on file. No past surgical history on file. Social History   Socioeconomic History  . Marital status: Married    Spouse name: Not on file  . Number of children: Not on file  . Years of education: Not on file  . Highest education level: Not on file  Occupational History  . Not on file  Social Needs  . Financial resource strain: Not on file  . Food insecurity    Worry: Not on file    Inability: Not on file  . Transportation needs    Medical: Not on file    Non-medical: Not on file  Tobacco Use  . Smoking status: Former Research scientist (life sciences)  . Smokeless tobacco: Never Used  Substance and Sexual Activity  . Alcohol use: Not on file  . Drug use: Not on file  . Sexual activity: Not on file  Lifestyle  . Physical activity    Days per week: Not on file    Minutes per session: Not on file  . Stress: Not on file  Relationships  . Social Herbalist on phone: Not on file    Gets together: Not on file    Attends religious service: Not on file    Active member of club or organization: Not on file    Attends meetings of clubs or organizations: Not on file    Relationship status: Not on file  Other Topics Concern  . Not on file  Social History Narrative  . Not on file   No Known Allergies No family history on file.  Current Outpatient Medications (Endocrine & Metabolic):  .   levothyroxine (SYNTHROID, LEVOTHROID) 112 MCG tablet, Take 112 mcg by mouth daily before breakfast. .  Norethin Ace-Eth Estrad-FE (GILDESS FE 1/20 PO), Take 1 tablet by mouth daily.      Current Outpatient Medications (Other):  Marland Kitchen  ALPRAZolam (XANAX) 0.25 MG tablet, Take 0.25 mg by mouth 3 (three) times daily as needed for anxiety. .  B Complex-C (B-COMPLEX WITH VITAMIN C) tablet, Take 1 tablet by mouth daily. .  Calcium Citrate-Vitamin D (CALCIUM + D PO), Take by mouth 2 (two) times daily. Calcium 1,000 mg and Vit D 3 100 IU .  gabapentin (NEURONTIN) 100 MG capsule, TAKE 2 CAPSULES BY MOUTH 2 TIMES A DAY .  gabapentin (NEURONTIN) 300 MG capsule, TAKE 1 CAPSULE BY MOUTH NIGHTLY .  Melatonin 10 MG CAPS, Take 10 mg by mouth. .  venlafaxine XR (EFFEXOR-XR) 75 MG 24 hr capsule, TAKE 1 CAPSULE BY MOUTH DAILY WITH BREAKFAST. Marland Kitchen  vitamin C (ASCORBIC ACID) 500 MG tablet, Take 500 mg by mouth daily.    Past medical history, social, surgical and family history all reviewed in electronic medical record.  No pertanent information unless stated regarding to the chief complaint.   Review of  Systems:  No headache, visual changes, nausea, vomiting, diarrhea, constipation, dizziness, abdominal pain, skin rash, fevers, chills, night sweats, weight loss, swollen lymph nodes, body aches, joint swelling,  chest pain, shortness of breath, mood changes.  Positive muscle aches  Objective  Blood pressure 138/84, pulse 83, height 5\' 4"  (1.626 m), weight 161 lb (73 kg), SpO2 98 %.    General: No apparent distress alert and oriented x3 mood and affect normal, dressed appropriately.  HEENT: Pupils equal, extraocular movements intact  Respiratory: Patient's speak in full sentences and does not appear short of breath  Cardiovascular: No lower extremity edema, non tender, no erythema  Skin: Warm dry intact with no signs of infection or rash on extremities or on axial skeleton.  Abdomen: Soft nontender  Neuro:  Cranial nerves II through XII are intact, neurovascularly intact in all extremities with 2+ DTRs and 2+ pulses.  Lymph: No lymphadenopathy of posterior or anterior cervical chain or axillae bilaterally.  Gait normal with good balance and coordination.  MSK:  Non tender with full range of motion and good stability and symmetric strength and tone of shoulders, elbows, wrist, hip, knee and ankles bilaterally.  No gross deformity, swelling, bruising. TTP .  No midline/bony TTP. FROM neck - pain . BUE strength 5/5.   Sensation intact to light touch.   2+ equal reflexes in triceps, biceps, brachioradialis tendons. Negative spurlings. NV intact distal BUEs.  Back exam does have some loss of lordosis.  Some tightness in the gluteal areas bilaterally.  Negative straight leg test.  Mild positive Faber test.  5 out of 5 strength in lower extremities  Osteopathic findings C2 flexed rotated and side bent right C4 flexed rotated and side bent left C6 flexed rotated and side bent left T3 extended rotated and side bent right inhaled third rib T9 extended rotated and side bent left L2 flexed rotated and side bent right Sacrum right on right     Impression and Recommendations:     This case required medical decision making of moderate complexity. The above documentation has been reviewed and is accurate and complete [name], DO       Note: This dictation was prepared with Dragon dictation along with smaller phrase technology. Any transcriptional errors that result from this process are unintentional.

## 2019-05-23 ENCOUNTER — Encounter: Payer: Self-pay | Admitting: Plastic Surgery

## 2019-05-23 ENCOUNTER — Encounter: Payer: Self-pay | Admitting: Family Medicine

## 2019-05-23 ENCOUNTER — Ambulatory Visit: Payer: BC Managed Care – PPO | Admitting: Family Medicine

## 2019-05-23 ENCOUNTER — Other Ambulatory Visit: Payer: Self-pay

## 2019-05-23 ENCOUNTER — Ambulatory Visit: Payer: BC Managed Care – PPO | Admitting: Plastic Surgery

## 2019-05-23 VITALS — BP 138/84 | HR 83 | Ht 64.0 in | Wt 161.0 lb

## 2019-05-23 DIAGNOSIS — D1722 Benign lipomatous neoplasm of skin and subcutaneous tissue of left arm: Secondary | ICD-10-CM | POA: Diagnosis not present

## 2019-05-23 DIAGNOSIS — M999 Biomechanical lesion, unspecified: Secondary | ICD-10-CM

## 2019-05-23 DIAGNOSIS — D179 Benign lipomatous neoplasm, unspecified: Secondary | ICD-10-CM | POA: Insufficient documentation

## 2019-05-23 DIAGNOSIS — G5702 Lesion of sciatic nerve, left lower limb: Secondary | ICD-10-CM | POA: Diagnosis not present

## 2019-05-23 NOTE — Progress Notes (Signed)
Patient ID: Carrie [name], female    DOB: 01-11-1971, 48 y.o.   MRN: [id number]   Chief Complaint  Patient presents with  . Skin Problem    The patient is a 48 year old female here for evaluation of her left arm.  She has noticed a mass on the left arm growing over the past year.  It is started to get quite large and painful with pressure.  It is soft and movable.  It is 3 x 4 cm in size.  It feels like a lipoma.  She has a smaller one on her right arm.  That one does not appear to be tender and has not changed in the past several months. Nothing makes it better. The skin does not look involved.    Review of Systems  Constitutional: Negative for activity change and appetite change.  HENT: Negative for dental problem.   Eyes: Negative.   Respiratory: Negative for chest tightness and shortness of breath.   Gastrointestinal: Negative for abdominal pain.  Genitourinary: Negative.   Skin: Negative for color change and wound.    History reviewed. No pertinent past medical history.  History reviewed. No pertinent surgical history.    Current Outpatient Medications:  .  ALPRAZolam (XANAX) 0.25 MG tablet, Take 0.25 mg by mouth 3 (three) times daily as needed for anxiety., Disp: , Rfl:  .  B Complex-C (B-COMPLEX WITH VITAMIN C) tablet, Take 1 tablet by mouth daily., Disp: , Rfl:  .  Calcium Citrate-Vitamin D (CALCIUM + D PO), Take by mouth 2 (two) times daily. Calcium 1,000 mg and Vit D 3 100 IU, Disp: , Rfl:  .  gabapentin (NEURONTIN) 100 MG capsule, TAKE 2 CAPSULES BY MOUTH 2 TIMES A DAY, Disp: 180 capsule, Rfl: 1 .  gabapentin (NEURONTIN) 300 MG capsule, TAKE 1 CAPSULE BY MOUTH NIGHTLY, Disp: 90 capsule, Rfl: 1 .  levothyroxine (SYNTHROID, LEVOTHROID) 112 MCG tablet, Take 112 mcg by mouth daily before breakfast., Disp: , Rfl:  .  Melatonin 10 MG CAPS, Take 10 mg by mouth., Disp: , Rfl:  .  Norethin Ace-Eth Estrad-FE (GILDESS FE 1/20 PO), Take 1 tablet by mouth daily., Disp: , Rfl:   .  venlafaxine XR (EFFEXOR-XR) 75 MG 24 hr capsule, TAKE 1 CAPSULE BY MOUTH DAILY WITH BREAKFAST., Disp: 90 capsule, Rfl: 1 .  vitamin C (ASCORBIC ACID) 500 MG tablet, Take 500 mg by mouth daily., Disp: , Rfl:    Objective:   Vitals:   05/23/19 1031  BP: 122/78  Pulse: 65  Temp: 98.7 F (37.1 C)  SpO2: 98%    Physical Exam Vitals signs and nursing note reviewed.  Constitutional:      Appearance: Normal appearance.  HENT:     Head: Normocephalic and atraumatic.  Cardiovascular:     Rate and Rhythm: Normal rate.     Pulses: Normal pulses.  Pulmonary:     Effort: Pulmonary effort is normal. No respiratory distress.  Abdominal:     General: Abdomen is flat. There is no distension.     Tenderness: There is no abdominal tenderness.  Musculoskeletal:        General: No deformity.  Skin:    General: Skin is warm.  Neurological:     General: No focal deficit present.     Mental Status: She is alert and oriented to person, place, and time.  Psychiatric:        Mood and Affect: Mood normal.  Behavior: Behavior normal.        Thought Content: Thought content normal.     Assessment & Plan:     ICD-10-CM   1. Lipoma of left upper extremity  D17.22       Recommend excision of left arm lipoma.  Can do in office. Pictures were obtained of the patient and placed in the chart with the patient's or guardian's permission.   [place], DO

## 2019-05-23 NOTE — Assessment & Plan Note (Signed)
Decision today to treat with OMT was based on Physical Exam  After verbal consent patient was treated with HVLA, ME, FPR techniques in cervical, thoracic, rib,  lumbar and sacral areas  Patient tolerated the procedure well with improvement in symptoms  Patient given exercises, stretches and lifestyle modifications  See medications in patient instructions if given  Patient will follow up in 4-8 weeks 

## 2019-05-23 NOTE — Patient Instructions (Signed)
Good to see you See me again in 6 weeks 

## 2019-05-23 NOTE — Assessment & Plan Note (Signed)
Mild tenderness but making improvement.  Discussed posture and ergonomics.  Discussed which activities to do which wants to avoid.  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  Patient will follow-up again in 4 to 8 weeks.

## 2019-06-29 ENCOUNTER — Telehealth: Payer: Self-pay

## 2019-06-29 NOTE — Telephone Encounter (Signed)

## 2019-06-30 ENCOUNTER — Other Ambulatory Visit (HOSPITAL_COMMUNITY)
Admission: RE | Admit: 2019-06-30 | Discharge: 2019-06-30 | Disposition: A | Discharge status: Home / Self Care | Payer: BC Managed Care – PPO | Source: Ambulatory Visit | Attending: Plastic Surgery | Admitting: Plastic Surgery

## 2019-06-30 ENCOUNTER — Other Ambulatory Visit: Payer: Self-pay

## 2019-06-30 ENCOUNTER — Encounter: Payer: Self-pay | Admitting: Plastic Surgery

## 2019-06-30 ENCOUNTER — Ambulatory Visit: Payer: BC Managed Care – PPO | Admitting: Plastic Surgery

## 2019-06-30 VITALS — BP 137/87 | HR 74 | Temp 98.8°F | Resp 16

## 2019-06-30 DIAGNOSIS — D1722 Benign lipomatous neoplasm of skin and subcutaneous tissue of left arm: Secondary | ICD-10-CM | POA: Insufficient documentation

## 2019-06-30 NOTE — Progress Notes (Signed)
Preoperative Dx: Lipoma of left arm  Postoperative Dx: Same  Procedure: Excision of lipoma left arm 3 x 3 cm  Surgeon: Dr. Lyndee Leo Dillingham  Anesthesia: Lidocaine 1% with 1:100,000 epinepherine  Indication for Procedure: Left arm mass  Description of Procedure: Risks and complications were explained to the patient.  Consent was confirmed and signed.  Time out was called and all information was confirmed to be correct.  The area was prepped with betadine and drapped.  Lidocaine 1% with epinepherine was injected in the subcutaneous area.  After waiting several minutes for the lidocaine to take affect a #15 blade was used to incise the skin over the left arm mass.  The mass was identified and looked like a normal lipoma.  With slight pressure on the side of the incision the lipoma was delivered.  There was some fullness proximal to the area but as I looked it was not a defined lipoma.  I was concerned I would create too much bleeding therefore left it alone.  A 5-0 Monocryl was used to close the skin edges.  Steri strips were applied.  The patient is to follow up in one week.  She tolerated the procedure well and there were no complications. The specimen was sent to pathology.

## 2019-07-03 NOTE — Progress Notes (Signed)
[name] [place], [place] [address] Phone: [phone] Subjective:   Carrie [name], am serving as a scribe for Dr. Hulan Saas. I'm seeing this patient by the request  of:    CC: Back pain, shoulder pain  [id number]   05/23/2019 OMT Carrie [name] is a 48 y.o. female coming in with complaint of back pain.  Patient has been seen multiple times.  States that there is some tightness in the buttocks area.   Does feel like she is having some shoulder pain on right side. Pain over posterior deltoid. Pain occurs with shoulder extension. Pain is sharp. Has been using arms a lot sanding walls at home. Feels that she needs to use prilosec again.  Social History   Socioeconomic History  . Marital status: Married    Spouse name: Not on file  . Number of children: Not on file  . Years of education: Not on file  . Highest education level: Not on file  Occupational History  . Not on file  Social Needs  . Financial resource strain: Not on file  . Food insecurity    Worry: Not on file    Inability: Not on file  . Transportation needs    Medical: Not on file    Non-medical: Not on file  Tobacco Use  . Smoking status: Former Research scientist (life sciences)  . Smokeless tobacco: Never Used  Substance and Sexual Activity  . Alcohol use: Not on file  . Drug use: Not on file  . Sexual activity: Not on file  Lifestyle  . Physical activity    Days per week: Not on file    Minutes per session: Not on file  . Stress: Not on file  Relationships  . Social Herbalist on phone: Not on file    Gets together: Not on file    Attends religious service: Not on file    Active member of club or organization: Not on file    Attends meetings of clubs or organizations: Not on file    Relationship status: Not on file  Other Topics Concern  . Not on file  Social History Narrative  . Not on file   [name] Known Allergies [name] family history on file.  Current  Outpatient Medications (Endocrine & Metabolic):  .  levothyroxine (SYNTHROID, LEVOTHROID) 112 MCG tablet, Take 112 mcg by mouth daily before breakfast. .  Norethin Ace-Eth Estrad-FE (GILDESS FE 1/20 PO), Take 1 tablet by mouth daily.      Current Outpatient Medications (Other):  Marland Kitchen  ALPRAZolam (XANAX) 0.25 MG tablet, Take 0.25 mg by mouth 3 (three) times daily as needed for anxiety. .  B Complex-C (B-COMPLEX WITH VITAMIN C) tablet, Take 1 tablet by mouth daily. .  Calcium Citrate-Vitamin D (CALCIUM + D PO), Take by mouth 2 (two) times daily. Calcium 1,000 mg and Vit D 3 100 IU .  gabapentin (NEURONTIN) 100 MG capsule, TAKE 2 CAPSULES BY MOUTH 2 TIMES A DAY .  gabapentin (NEURONTIN) 300 MG capsule, TAKE 1 CAPSULE BY MOUTH NIGHTLY .  Melatonin 10 MG CAPS, Take 10 mg by mouth. .  venlafaxine XR (EFFEXOR-XR) 75 MG 24 hr capsule, TAKE 1 CAPSULE BY MOUTH DAILY WITH BREAKFAST. Marland Kitchen  vitamin C (ASCORBIC ACID) 500 MG tablet, Take 500 mg by mouth daily.    Past medical history, social, surgical and family history all reviewed in electronic medical record.  [name] pertanent information unless stated regarding  to the chief complaint.   Review of Systems:  [name] headache, visual changes, nausea, vomiting, diarrhea, constipation, dizziness, abdominal pain, skin rash, fevers, chills, night sweats, weight loss, swollen lymph nodes, body aches, joint swelling, muscle aches, chest pain, shortness of breath, mood changes.   Objective  There were [name] vitals taken for this visit. Systems examined below as of    General: [name] apparent distress alert and oriented x3 mood and affect normal, dressed appropriately.  HEENT: Pupils equal, extraocular movements intact  Respiratory: Patient's speak in full sentences and does not appear short of breath  Cardiovascular: [name] lower extremity edema, non tender, [name] erythema  Skin: Warm dry intact with [name] signs of infection or rash on extremities or on axial skeleton.  Abdomen: Soft  nontender  Neuro: Cranial nerves II through XII are intact, neurovascularly intact in all extremities with 2+ DTRs and 2+ pulses.  Lymph: [name] lymphadenopathy of posterior or anterior cervical chain or axillae bilaterally.  Gait normal with good balance and coordination.  MSK:  Non tender with full range of motion and good stability and symmetric strength and tone of  elbows, wrist, hip, knee and ankles bilaterally.  Shoulder: Right Inspection reveals [name] abnormalities, atrophy or asymmetry. Palpation is normal with [name] tenderness over AC joint or bicipital groove. ROM is full in all planes passively. Rotator cuff strength normal throughout. signs of impingement with positive Neer and Hawkin's tests, but negative empty can sign. Speeds and Yergason's tests normal. [name] labral pathology noted with negative Obrien's, negative clunk and good stability. Normal scapular function observed. [name] painful arc and [name] drop arm sign. [name] apprehension sign  MSK US performed of: Right This study was ordered, performed, and interpreted by Charlann Boxer D.O.  Shoulder:   Supraspinatus:  Appears normal on long and transverse views, Bursal bulge seen with shoulder abduction on impingement view. Infraspinatus:  Appears normal on long and transverse views. Significant increase in Doppler flow Subscapularis:  Appears normal on long and transverse views. Positive bursa Teres Minor:  Appears normal on long and transverse views. AC joint:  Capsule undistended, [name] geyser sign. Glenohumeral Joint:  Appears normal without effusion. Glenoid Labrum:  Intact without visualized tears. Biceps Tendon:  Appears normal on long and transverse views, [name] fraying of tendon, tendon located in intertubercular groove, [name] subluxation with shoulder internal or external rotation.  Impression: Subacromial bursitis  Procedure: Real-time Ultrasound Guided Injection of right glenohumeral joint Device: GE Logiq E  Ultrasound guided injection  is preferred based studies that show increased duration, increased effect, greater accuracy, decreased procedural pain, increased response rate with ultrasound guided versus blind injection.  Verbal informed consent obtained.  Time-out conducted.  Noted [name] overlying erythema, induration, or other signs of local infection.  Skin prepped in a sterile fashion.  Local anesthesia: Topical Ethyl chloride.  With sterile technique and under real time ultrasound guidance:  Joint visualized.  23g 1  inch needle inserted posterior approach. Pictures taken for needle placement. Patient did have injection of 2 cc of 1% lidocaine, 2 cc of 0.5% Marcaine, and 1.0 cc of Kenalog 40 mg/dL. Completed without difficulty  Pain immediately resolved suggesting accurate placement of the medication.  Advised to call if fevers/chills, erythema, induration, drainage, or persistent bleeding.  Images permanently stored and available for review in the ultrasound unit.  Impression: Technically successful ultrasound guided injection.  Back exam has some tightness in the paraspinal musculature of the lumbar spine on the right side.  Positive Corky Sox on the  right side.  Negative straight leg test.  Osteopathic findings C2 flexed rotated and side bent right C6 flexed rotated and side bent left T3 extended rotated and side bent right inhaled third rib T9 extended rotated and side bent left L4  flexed rotated and side bent left  Sacrum right on right    Impression and Recommendations:     This case required medical decision making of moderate complexity. The above documentation has been reviewed and is accurate and complete [name], DO       Note: This dictation was prepared with Dragon dictation along with smaller phrase technology. Any transcriptional errors that result from this process are unintentional.

## 2019-07-04 ENCOUNTER — Ambulatory Visit: Payer: Self-pay

## 2019-07-04 ENCOUNTER — Telehealth: Payer: Self-pay

## 2019-07-04 ENCOUNTER — Ambulatory Visit: Payer: BC Managed Care – PPO | Admitting: Family Medicine

## 2019-07-04 ENCOUNTER — Other Ambulatory Visit: Payer: Self-pay

## 2019-07-04 ENCOUNTER — Ambulatory Visit (INDEPENDENT_AMBULATORY_CARE_PROVIDER_SITE_OTHER)
Admission: RE | Admit: 2019-07-04 | Discharge: 2019-07-04 | Disposition: A | Discharge status: Home / Self Care | Payer: BC Managed Care – PPO | Source: Ambulatory Visit | Attending: Family Medicine | Admitting: Family Medicine

## 2019-07-04 ENCOUNTER — Encounter: Payer: Self-pay | Admitting: Family Medicine

## 2019-07-04 VITALS — BP 116/74 | HR 102 | Ht 64.0 in | Wt 159.0 lb

## 2019-07-04 DIAGNOSIS — M999 Biomechanical lesion, unspecified: Secondary | ICD-10-CM | POA: Diagnosis not present

## 2019-07-04 DIAGNOSIS — M7551 Bursitis of right shoulder: Secondary | ICD-10-CM

## 2019-07-04 DIAGNOSIS — G8929 Other chronic pain: Secondary | ICD-10-CM

## 2019-07-04 DIAGNOSIS — M25511 Pain in right shoulder: Secondary | ICD-10-CM

## 2019-07-04 DIAGNOSIS — G5702 Lesion of sciatic nerve, left lower limb: Secondary | ICD-10-CM | POA: Diagnosis not present

## 2019-07-04 NOTE — Assessment & Plan Note (Signed)
Decision today to treat with OMT was based on Physical Exam  After verbal consent patient was treated with HVLA, ME, FPR techniques in cervical, thoracic, rib,  lumbar and sacral areas  Patient tolerated the procedure well with improvement in symptoms  Patient given exercises, stretches and lifestyle modifications  See medications in patient instructions if given  Patient will follow up in 4-8 weeks 

## 2019-07-04 NOTE — Assessment & Plan Note (Signed)
Given injection today.  Discussed icing regimen and home exercise discussed which activities to do which wants to avoid.  Patient has had no to injections in this over the course of a year and a half.  We will need to monitor.  Could do PRP.  Follow-up again in 4 to 8 weeks x-rays pending

## 2019-07-04 NOTE — Telephone Encounter (Signed)
Call to pt -n/a-left v/m for call back

## 2019-07-04 NOTE — Patient Instructions (Signed)
Injected the shoulder today along with OMT Keep hands in peripheral vision Xray downstairs See me again in 6 weeks

## 2019-07-04 NOTE — Assessment & Plan Note (Signed)
Continues to have gluteal pain.  Has responded well to osteopathic manipulation.  Sitting more and doing patrol teaching.  Discussed posture and ergonomics, discussed changing positions.  Follow-up again in 4 to 8 weeks

## 2019-07-05 ENCOUNTER — Telehealth: Payer: Self-pay

## 2019-07-05 NOTE — Telephone Encounter (Signed)
Call to pt to inform her of the path report- per Dr. 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 Marla Roe, but she is reminded to call for any concerns [place]

## 2019-07-11 ENCOUNTER — Encounter: Payer: BC Managed Care – PPO | Admitting: Nurse Practitioner

## 2019-07-12 ENCOUNTER — Telehealth: Payer: Self-pay

## 2019-07-12 NOTE — Telephone Encounter (Signed)

## 2019-07-13 ENCOUNTER — Encounter: Payer: Self-pay | Admitting: Surgical

## 2019-07-13 ENCOUNTER — Ambulatory Visit (INDEPENDENT_AMBULATORY_CARE_PROVIDER_SITE_OTHER): Payer: BC Managed Care – PPO | Admitting: Surgical

## 2019-07-13 ENCOUNTER — Other Ambulatory Visit: Payer: Self-pay

## 2019-07-13 VITALS — BP 139/86 | HR 88 | Temp 96.6°F | Ht 64.0 in | Wt 158.8 lb

## 2019-07-13 DIAGNOSIS — D1722 Benign lipomatous neoplasm of skin and subcutaneous tissue of left arm: Secondary | ICD-10-CM

## 2019-07-13 NOTE — Progress Notes (Signed)
Carrie [name] is here for follow up after excision of left arm lipoma on 06/30/19 with Dr. Marla Roe.  Pathology report showed fibroadipose tissue.  Patient is doing well. No fevers, chills, n/v.  Incision is c/d/i. No sign of seroma, hematoma.   Sutures removed. Follow up as needed.

## 2019-07-18 ENCOUNTER — Other Ambulatory Visit: Payer: Self-pay

## 2019-07-18 ENCOUNTER — Ambulatory Visit (HOSPITAL_COMMUNITY)
Admission: EM | Admit: 2019-07-18 | Discharge: 2019-07-18 | Disposition: A | Discharge status: Home / Self Care | Payer: BC Managed Care – PPO | Source: Home / Self Care

## 2019-07-18 ENCOUNTER — Emergency Department (HOSPITAL_COMMUNITY)
Admission: EM | Admit: 2019-07-18 | Discharge: 2019-07-18 | Disposition: A | Discharge status: Home / Self Care | Payer: BC Managed Care – PPO | Attending: Emergency Medicine | Admitting: Emergency Medicine

## 2019-07-18 ENCOUNTER — Encounter (HOSPITAL_COMMUNITY): Payer: Self-pay | Admitting: *Deleted

## 2019-07-18 ENCOUNTER — Emergency Department (HOSPITAL_COMMUNITY): Payer: BC Managed Care – PPO

## 2019-07-18 DIAGNOSIS — E039 Hypothyroidism, unspecified: Secondary | ICD-10-CM | POA: Diagnosis not present

## 2019-07-18 DIAGNOSIS — R252 Cramp and spasm: Secondary | ICD-10-CM | POA: Insufficient documentation

## 2019-07-18 DIAGNOSIS — Y999 Unspecified external cause status: Secondary | ICD-10-CM | POA: Diagnosis not present

## 2019-07-18 DIAGNOSIS — Z79899 Other long term (current) drug therapy: Secondary | ICD-10-CM | POA: Diagnosis not present

## 2019-07-18 DIAGNOSIS — Z87891 Personal history of nicotine dependence: Secondary | ICD-10-CM | POA: Insufficient documentation

## 2019-07-18 DIAGNOSIS — S0081XA Abrasion of other part of head, initial encounter: Secondary | ICD-10-CM | POA: Diagnosis not present

## 2019-07-18 DIAGNOSIS — R55 Syncope and collapse: Secondary | ICD-10-CM | POA: Diagnosis not present

## 2019-07-18 DIAGNOSIS — R569 Unspecified convulsions: Secondary | ICD-10-CM | POA: Diagnosis not present

## 2019-07-18 DIAGNOSIS — Y9301 Activity, walking, marching and hiking: Secondary | ICD-10-CM | POA: Insufficient documentation

## 2019-07-18 DIAGNOSIS — Y92002 Bathroom of unspecified non-institutional (private) residence single-family (private) house as the place of occurrence of the external cause: Secondary | ICD-10-CM | POA: Diagnosis not present

## 2019-07-18 DIAGNOSIS — S0993XA Unspecified injury of face, initial encounter: Secondary | ICD-10-CM | POA: Diagnosis present

## 2019-07-18 DIAGNOSIS — W19XXXA Unspecified fall, initial encounter: Secondary | ICD-10-CM | POA: Insufficient documentation

## 2019-07-18 HISTORY — DX: Endometriosis, unspecified: N80.9

## 2019-07-18 LAB — BASIC METABOLIC PANEL
Anion gap: 14 (ref 5–15)
BUN: 14 mg/dL (ref 6–20)
CO2: 21 mmol/L — ABNORMAL LOW (ref 22–32)
Calcium: 9.3 mg/dL (ref 8.9–10.3)
Chloride: 101 mmol/L (ref 98–111)
Creatinine, Ser: 0.83 mg/dL (ref 0.44–1.00)
GFR calc Af Amer: >60 mL/min (ref >60)
GFR calc non Af Amer: >60 mL/min (ref >60)
Glucose, Bld: 173 mg/dL — ABNORMAL HIGH (ref 70–99)
Potassium: 4.8 mmol/L (ref 3.5–5.1)
Sodium: 136 mmol/L (ref 135–145)

## 2019-07-18 LAB — URINALYSIS, ROUTINE W REFLEX MICROSCOPIC
Bilirubin Urine: NEGATIVE
Glucose, UA: NEGATIVE mg/dL
Hgb urine dipstick: NEGATIVE
Ketones, ur: NEGATIVE mg/dL
Leukocytes,Ua: NEGATIVE
Nitrite: NEGATIVE
Protein, ur: NEGATIVE mg/dL
Specific Gravity, Urine: 1.013 (ref 1.005–1.030)
pH: 6 (ref 5.0–8.0)

## 2019-07-18 LAB — CBC
HCT: 41.4 % (ref 36.0–46.0)
Hemoglobin: 13.4 g/dL (ref 12.0–15.0)
MCH: 31.3 pg (ref 26.0–34.0)
MCHC: 32.4 g/dL (ref 30.0–36.0)
MCV: 96.7 fL (ref 80.0–100.0)
Platelets: 339 10*3/uL (ref 150–400)
RBC: 4.28 MIL/uL (ref 3.87–5.11)
RDW: 14.3 % (ref 11.5–15.5)
WBC: 15.1 10*3/uL — ABNORMAL HIGH (ref 4.0–10.5)
nRBC: 0 % (ref 0.0–0.2)

## 2019-07-18 LAB — RAPID URINE DRUG SCREEN, HOSP PERFORMED
Amphetamines: NOT DETECTED
Barbiturates: NOT DETECTED
Benzodiazepines: POSITIVE — AB
Cocaine: NOT DETECTED
Opiates: NOT DETECTED
Tetrahydrocannabinol: NOT DETECTED

## 2019-07-18 LAB — I-STAT BETA HCG BLOOD, ED (MC, WL, AP ONLY): I-stat hCG, quantitative: <5 m[IU]/mL (ref <5)

## 2019-07-18 MED ORDER — SODIUM CHLORIDE 0.9% FLUSH: 3.0000 mL | Freq: Once | INTRAVENOUS | Status: DC

## 2019-07-18 NOTE — ED Triage Notes (Signed)
Pt in reports syncopal episode at home today @ 3am not witnessed, pt states, "my husband heard me fall and said I was out for about a minute." pt has a scratch on her forehead,  Pt ambulatory in ED, pt does not take blood thinners, pt denies neck and back pain, pt reports contusion to buttocks from hitting toilet, pt A&O x4, MAE, denies CP & SOB, denies n/v, pt c/o diarrhea and dizziness, pt reports liquid stools twice in the last 24 hours

## 2019-07-18 NOTE — ED Provider Notes (Signed)
[facility] EMERGENCY DEPARTMENT Provider Note   CSN: [id number] Arrival date & time: 07/18/19  [id number]     History   Chief Complaint Chief Complaint  Patient presents with  . Loss of Consciousness    HPI Carrie [name] is a 48 y.o. female with past medical history of endometriosis presents emergency department today with chief complaint of unwitnessed syncopal episode.  This happened approximately 12 hours prior to arrival.  She admits to daily alcohol consumption and drank 9 beers last night.  She went to bed feeling like her normal self and woke up during the night with a cramp in her leg.  She got up to walk to the bathroom and started to feel lightheaded causing her to fall.  Her spouse heard her fall and immediately got out of bed to on her.  When he open the bathroom door patient was sitting on the floor saying "I got lightheaded and fell."  Against later she was unresponsive.  Spouse thinks she was unresponsive for approximately 1 to 2 minutes.  He went to get a pillow to put under her head and when he came back he noticed she had incontinent of urine.  He did not notice any full body shaking or seizure-like activity.  When patient came to she was confused and crying.  She was repeating over and over that she was sorry.  She went back to bed and when she woke up hours later she was back to her normal self.  She did hit her forehead on the shower door has a small superficial laceration that is tender to palpation.  Pain does not radiate.  She did not take anything for pain prior to arrival.  She denies fever, chills, recent illness, increased stress, drug use, chest pain, palpitations, shortness of breath, abdominal pain, nausea, vomiting, diarrhea.  Denies biting her tongue. History provided by patient with additional history obtained from chart review.      Past Medical History:  Diagnosis Date  . Endometriosis     Patient Active Problem List   Diagnosis Date  Noted  . Lipoma 05/23/2019  . Contusion of heel, initial encounter 09/07/2018  . Nonallopathic lesion of cervical region 07/26/2018  . Nonallopathic lesion of rib cage 07/26/2018  . 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 Trigger point of shoulder region, right 01/26/2018  . Acute bursitis of right shoulder 12/15/2017  . Cyst, dermoid, arm, left 09/06/2017  . Nonallopathic lesion of lumbosacral region 06/21/2017  . Nonallopathic lesion of sacral region 06/21/2017  . Nonallopathic lesion of thoracic region 06/21/2017  . Gluteal tendonitis of left buttock 04/26/2017  . Stress fracture of femur 02/17/2017  . Piriformis syndrome, left 02/02/2017  . Hypothyroidism 08/06/2014  . Acute upper respiratory infection 08/27/2008  . CONTUSION OF FOOT 01/31/2008  . DEPRESSION 06/20/2007  . HEADACHE 06/20/2007    Past Surgical History:  Procedure Laterality Date  . LAPAROSCOPIC OOPHERECTOMY Left      OB History   No obstetric history on file.      Home Medications    Prior to Admission medications   Medication Sig Start Date End Date Taking? Authorizing Provider  ALPRAZolam (XANAX) 0.25 MG tablet Take 0.25 mg by mouth 3 (three) times daily as needed for anxiety.    [provider]  B Complex-C (B-COMPLEX WITH VITAMIN C) tablet Take 1 tablet by mouth daily.    [provider]  Calcium Citrate-Vitamin D (CALCIUM + D PO) Take by  mouth 2 (two) times daily. Calcium 1,000 mg and Vit D 3 100 IU    [provider]  gabapentin (NEURONTIN) 100 MG capsule TAKE 2 CAPSULES BY MOUTH 2 TIMES A DAY 11/08/18   [name], DO  gabapentin (NEURONTIN) 300 MG capsule TAKE 1 CAPSULE BY MOUTH NIGHTLY 04/10/19   [name], DO  levothyroxine (SYNTHROID, LEVOTHROID) 112 MCG tablet Take 112 mcg by mouth daily before breakfast.    [provider]  Melatonin 10 MG CAPS Take 10 mg by mouth.    [provider]  Norethin Ace-Eth Estrad-FE (GILDESS  FE 1/20 PO) Take 1 tablet by mouth daily.    [provider]  venlafaxine XR (EFFEXOR-XR) 75 MG 24 hr capsule TAKE 1 CAPSULE BY MOUTH DAILY WITH BREAKFAST. 02/06/19   [name], DO  vitamin C (ASCORBIC ACID) 500 MG tablet Take 500 mg by mouth daily.    [provider]    Family History No family history on file.  Social History Social History   Tobacco Use  . Smoking status: Former Research scientist (life sciences)  . Smokeless tobacco: Never Used  Substance Use Topics  . Alcohol use: Not Currently  . Drug use: Never     Allergies   Patient has no known allergies.   Review of Systems Review of Systems  Constitutional: Negative for chills and fever.  HENT: Negative for congestion, ear discharge, ear pain, sinus pressure, sinus pain and sore throat.   Eyes: Negative for pain and redness.  Respiratory: Negative for cough and shortness of breath.   Cardiovascular: Negative for chest pain.  Gastrointestinal: Negative for abdominal pain, constipation, diarrhea, nausea and vomiting.  Genitourinary: Negative for dysuria and hematuria.  Musculoskeletal: Negative for back pain and neck pain.  Skin: Positive for wound.  Neurological: Positive for seizures, syncope and light-headedness. Negative for weakness, numbness and headaches.     Physical Exam Updated Vital Signs BP 123/75 (BP Location: Left Arm)   Pulse 89   Temp 98.3 F (36.8 C) (Oral)   Resp 19   Ht 5\' 3"  (1.6 m)   Wt 71.2 kg   LMP 06/18/2019   SpO2 99%   BMI 27.81 kg/m   Physical Exam Vitals signs and nursing note reviewed.  Constitutional:      General: She is not in acute distress.    Appearance: She is not ill-appearing.  HENT:     Head: Normocephalic. No raccoon eyes or Battle's sign.     Jaw: There is normal jaw occlusion.     Comments: Superficial abrasion to left forehead with mild tenderness to palpation.    Right Ear: Tympanic membrane and external ear normal. No hemotympanum.     Left Ear: Tympanic  membrane and external ear normal. No hemotympanum.     Nose: Nose normal. No nasal tenderness.     Mouth/Throat:     Mouth: Mucous membranes are moist. No injury or lacerations.     Pharynx: Oropharynx is clear.  Eyes:     General: No scleral icterus.       Right eye: No discharge.        Left eye: No discharge.     Extraocular Movements: Extraocular movements intact.     Conjunctiva/sclera: Conjunctivae normal.     Pupils: Pupils are equal, round, and reactive to light.  Neck:     Musculoskeletal: Normal range of motion.     Vascular: No JVD.     Comments: Full ROM intact without  spinous process TTP. No bony stepoffs or deformities, no paraspinous muscle TTP or muscle spasms. No bruising, erythema, or swelling.   Cardiovascular:     Rate and Rhythm: Normal rate and regular rhythm.     Pulses: Normal pulses.          Radial pulses are 2+ on the right side and 2+ on the left side.     Heart sounds: Normal heart sounds.  Pulmonary:     Comments: Lungs clear to auscultation in all fields. Symmetric chest rise. No wheezing, rales, or rhonchi. Abdominal:     Comments: Abdomen is soft, non-distended, and non-tender in all quadrants. No rigidity, no guarding. No peritoneal signs.  Musculoskeletal: Normal range of motion.  Skin:    General: Skin is warm and dry.     Capillary Refill: Capillary refill takes less than 2 seconds.  Neurological:     Mental Status: She is oriented to person, place, and time.     GCS: GCS eye subscore is 4. GCS verbal subscore is 5. GCS motor subscore is 6.     Comments: Mental Status:  Alert, oriented, thought content appropriate, able to give a coherent history. Speech fluent without evidence of aphasia. Able to follow 2 step commands without difficulty.  Cranial Nerves:  II:  Peripheral visual fields grossly normal, pupils equal, round, reactive to light III,IV, VI: ptosis not present, extra-ocular motions intact bilaterally  V,VII: smile symmetric,  facial light touch sensation equal VIII: hearing grossly normal to voice  X: uvula elevates symmetrically  XI: bilateral shoulder shrug symmetric and strong XII: midline tongue extension without fassiculations Motor:  Normal tone. 5/5 in upper and lower extremities bilaterally including strong and equal grip strength and dorsiflexion/plantar flexion Sensory: Pinprick and light touch normal in all extremities.  Deep Tendon Reflexes: 2+ and symmetric in the biceps and patella Cerebellar: normal finger-to-nose with bilateral upper extremities Gait: normal gait and balance CV: distal pulses palpable throughout   Psychiatric:        Behavior: Behavior normal.        ED Treatments / Results  Labs (all labs ordered are listed, but only abnormal results are displayed) Labs Reviewed  BASIC METABOLIC PANEL - Abnormal; Notable for the following components:      Result Value   CO2 21 (*)    Glucose, Bld 173 (*)    All other components within normal limits  CBC - Abnormal; Notable for the following components:   WBC 15.1 (*)    All other components within normal limits  URINALYSIS, ROUTINE W REFLEX MICROSCOPIC - Abnormal; Notable for the following components:   Color, Urine AMBER (*)    APPearance HAZY (*)    All other components within normal limits  RAPID URINE DRUG SCREEN, HOSP PERFORMED - Abnormal; Notable for the following components:   Benzodiazepines POSITIVE (*)    All other components within normal limits  I-STAT BETA HCG BLOOD, ED ([facility], [facility], [facility] ONLY)  CBG MONITORING, ED    EKG EKG Interpretation  Date/Time:  Tuesday July 18 2019 08:52:48 EDT Ventricular Rate:  100 PR Interval:  140 QRS Duration: 80 QT Interval:  330 QTC Calculation: 425 R Axis:   87 Text Interpretation:  Normal sinus rhythm Normal ECG Confirmed by Lennice Sites [phone]) on 07/18/2019 3:46:17 PM   Radiology No results found.  Procedures Procedures (including critical care time)  Medications  Ordered in ED Medications  sodium chloride flush (NS) 0.9 % injection 3 mL (has no  administration in time range)     Initial Impression / Assessment and Plan / ED Course  I have reviewed the triage vital signs and the nursing notes.  Pertinent labs & imaging results that were available during my care of the patient were reviewed by me and considered in my medical decision making (see chart for details).  Patient seen and examined. Patient nontoxic appearing, in no apparent distress, vitals WNL.  She is presenting after possible seizure.  On arrival she is already back to baseline.  Neuro exam is without focal deficit.  CBC with nonspecific leukocytosis of 15.1.  BMP overall unremarkable.  Beta-hCG is negative.  UA without signs of infection.  EKG shows normal sinus rhythm, no signs of ischemia.  UDS is positive for benzodiazepines.  CT head is pending.  DDX includes first-time seizure given postictal confusion and urinary incontinence, less likely alcohol withdrawal seizure as patient had more than her usual amount of alcohol last night.  No cardiac history and had prodrome, doubt cardiac etiology.  Patient care transferred to [facility]. F[place]ze PA-C at the end of my shift pending head CT. Patient presentation, ED course, and plan of care discussed with review of all pertinent labs and imaging. Please see hernote for further details regarding further ED course and disposition.  Dissipate discharge home with outpatient neuro follow-up for further evaluation.  Already discussed strict return precautions with patient as well as seizure precautions including no driving until neurology evaluation. Findings and plan of care discussed with supervising physician Dr. Ronnald Nian.   Portions of this note were generated with Lobbyist. Dictation errors may occur despite best attempts at proofreading.    Final Clinical Impressions(s) / ED Diagnoses   Final diagnoses:  Seizure-like activity [facility])     ED Discharge Orders         Ordered    Ambulatory referral to Neurology    Comments: An appointment is requested in approximately: 1 week for first time seizure work up   07/18/19 [place], Harley Hallmark, PA-C 07/18/19 [place], [place], DO 07/18/19 1729

## 2019-07-18 NOTE — ED Notes (Signed)
Pt stable and ambulatory upon d/c. Pt left with husband and understood the  need for neuro follow up.

## 2019-07-18 NOTE — ED Notes (Signed)
Pt was referred to the ED By Dr.Mortenson to be further evaluated. Due to passing out at home.

## 2019-07-18 NOTE — ED Notes (Signed)
Patient transported to CT 

## 2019-07-18 NOTE — ED Provider Notes (Signed)
Received patient at signout from [place].  Refer to provider note for full history and physical examination.  Recently, patient is a 48 year old female presenting to the ED for evaluation of unwitnessed syncopal episode.  This occurred around 3 AM while ambulating to the bathroom when she began to feel lightheaded.  She was on the floor found by her husband and then became unresponsive for a brief period of time and had an episode of urine incontinence.  No tonic-clonic seizure-like activity.  Work-up thus far in the ED has been reassuring.  Pending head CT.  If negative, plan for outpatient neurology follow-up with seizure precautions.   MDM  Ct Head Wo Contrast  Result Date: 07/18/2019 CLINICAL DATA:  Seizure, new, nontraumatic, >40 years EXAM: CT HEAD WITHOUT CONTRAST TECHNIQUE: Contiguous axial images were obtained from the base of the skull through the vertex without intravenous contrast. COMPARISON:  No pertinent prior studies available for comparison. FINDINGS: Brain: There is no acute intracranial hemorrhage. No demarcated cortical infarction. No evidence of intracranial mass. No midline shift or extra-axial fluid collection. Cerebral volume is normal. Vascular: No hyperdense vessel Skull: No calvarial fracture. Sinuses/Orbits: Imaged globes and orbits demonstrate no acute abnormality. No significant paranasal sinus disease or mastoid effusion at the imaged levels. IMPRESSION: No CT evidence of acute intracranial abnormality. Consider MRI for further evaluation, as clinically warranted. Electronically Signed   By: Kellie Simmering   On: 07/18/2019 18:55    No acute intracranial abnormalities.  Stable for discharge home with follow-up with neurology outpatient for reevaluation of symptoms.  Seizure precautions discussed.  Patient stable for discharge home at this time.       Renita Papa, PA-C 07/20/19 0135    Maudie Flakes, MD 07/22/19 [phone]

## 2019-07-18 NOTE — Discharge Instructions (Addendum)
Your head CT today showed no abnormalities.   Please follow-up with neurology outpatient for further evaluation of your possible seizure.  I have sent in referral to [facility] neurology.  If you do not hear from them within 1 week you can call to schedule a follow-up appointment.   Per [facility] statutes, patients with seizures are not allowed to drive until  they have been seizure-free for six months. Use caution when using heavy equipment or power tools. Avoid working on ladders or at heights. Take showers instead of baths. Ensure the water temperature is not too high on the home water heater. Do not go swimming alone. When caring for infants or small children, sit down when holding, feeding, or changing them to minimize risk of injury to the child in the event you have a seizure.   Also, Maintain good sleep hygiene. Avoid alcohol.   --> Call 911 and bring the patient back to the ED if:                   -  The seizure lasts longer than 5 minutes.                  -  The patient doesn't awaken shortly after the seizure             -  The patient has new problems such as difficulty seeing, speaking or moving             -  The patient was injured during the seizure             - The patient has a temperature over 102 F (39C)             - The patient vomited and now is having trouble breathing

## 2019-07-18 NOTE — ED Notes (Signed)
Patient states passed out in bathroom early this morning.  Husband found patient on floor, unknown time unconscious.  Spoke with Dr. Alphonzo Cruise who advised pt go to ED for further evaluation.

## 2019-07-19 ENCOUNTER — Encounter: Payer: Self-pay | Admitting: Neurology

## 2019-07-21 ENCOUNTER — Ambulatory Visit: Payer: BC Managed Care – PPO | Admitting: Plastic Surgery

## 2019-07-26 ENCOUNTER — Other Ambulatory Visit: Payer: Self-pay | Admitting: Family Medicine

## 2019-08-14 NOTE — Assessment & Plan Note (Signed)
Decision today to treat with OMT was based on Physical Exam  After verbal consent patient was treated with HVLA, ME, FPR techniques in cervical, thoracic, rib lumbar and sacral areas  Patient tolerated the procedure well with improvement in symptoms  Patient given exercises, stretches and lifestyle modifications  See medications in patient instructions if given  Patient will follow up in 4-8 weeks 

## 2019-08-14 NOTE — Progress Notes (Signed)
[name] [place],  [address] Phone: [phone] Subjective:   [name], am serving as a scribe for Dr. Hulan Saas.  I'm seeing this patient by the request  of:    CC: Low back pain follow-up  [id number]  Carrie [name] is a 48 y.o. female coming in with complaint of back pain. Last seen on 07/04/2019 for OMT. Patient states doing relatively well, stress seems to be exacerbated him.  Did have a recent episode of what appeared to be a vasovagal and had to go to the emergency room and has had not any difficulty since.  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     Past Medical History:  Diagnosis Date   Endometriosis    Past Surgical History:  Procedure Laterality Date   LAPAROSCOPIC OOPHERECTOMY Left    Social History   Socioeconomic History   Marital status: Married    Spouse name: Not on file   Number of children: Not on file   Years of education: Not on file   Highest education level: Not on file  Occupational History   Not on file  Social Needs   Financial resource strain: Not on file   Food insecurity    Worry: Not on file    Inability: Not on file   Transportation needs    Medical: Not on file    Non-medical: Not on file  Tobacco Use   Smoking status: Former Smoker   Smokeless tobacco: Never Used  Substance and Sexual Activity   Alcohol use: Not Currently   Drug use: Never   Sexual activity: Not Currently  Lifestyle   Physical activity    Days per week: Not on file    Minutes per session: Not on file   Stress: Not on file  Relationships   Social connections    Talks on phone: Not on file    Gets together: Not on file    Attends religious service: Not on file    Active member of club or organization: Not on file    Attends meetings of clubs or organizations: Not on file    Relationship  status: Not on file  Other Topics Concern   Not on file  Social History Narrative   Not on file   No Known Allergies No family history on file.  Current Outpatient Medications (Endocrine & Metabolic):    levothyroxine (SYNTHROID, LEVOTHROID) 112 MCG tablet, Take 112 mcg by mouth daily before breakfast.   Norethin Ace-Eth Estrad-FE (GILDESS FE 1/20 PO), Take 1 tablet by mouth daily.      Current Outpatient Medications (Other):    ALPRAZolam (XANAX) 0.25 MG tablet, Take 0.25 mg by mouth 3 (three) times daily as needed for anxiety.   B Complex-C (B-COMPLEX WITH VITAMIN C) tablet, Take 1 tablet by mouth daily.   Calcium Citrate-Vitamin D (CALCIUM + D PO), Take by mouth 2 (two) times daily. Calcium 1,000 mg and Vit D 3 100 IU   gabapentin (NEURONTIN) 100 MG capsule, TAKE 2 CAPSULES BY MOUTH 2 TIMES A DAY   gabapentin (NEURONTIN) 300 MG capsule, TAKE 1 CAPSULE BY MOUTH NIGHTLY   Melatonin 10 MG CAPS, Take 10 mg by mouth.   venlafaxine XR (EFFEXOR-XR) 75 MG 24 hr capsule, TAKE 1 CAPSULE BY MOUTH DAILY WITH BREAKFAST.   vitamin C (ASCORBIC ACID) 500 MG tablet,  Take 500 mg by mouth daily.    Past medical history, social, surgical and family history all reviewed in electronic medical record.  No pertanent information unless stated regarding to the chief complaint.   Review of Systems:  No headache, visual changes, nausea, vomiting, diarrhea, constipation, dizziness, abdominal pain, skin rash, fevers, chills, night sweats, weight loss, swollen lymph nodes, body aches, joint swelling, muscle aches, chest pain, shortness of breath, mood changes.   Objective  Blood pressure 130/88, pulse (!) 103, height 5\' 3"  (1.6 m), weight 163 lb (73.9 kg), SpO2 98 %. Systems examined below as of    General: No apparent distress alert and oriented x3 mood and affect normal, dressed appropriately.  HEENT: Pupils equal, extraocular movements intact  Respiratory: Patient's speak in full sentences  and does not appear short of breath  Cardiovascular: No lower extremity edema, non tender, no erythema  Skin: Warm dry intact with no signs of infection or rash on extremities or on axial skeleton.  Abdomen: Soft nontender  Neuro: Cranial nerves II through XII are intact, neurovascularly intact in all extremities with 2+ DTRs and 2+ pulses.  Lymph: No lymphadenopathy of posterior or anterior cervical chain or axillae bilaterally.  Gait normal with good balance and coordination.  MSK:  Non tender with full range of motion and good stability and symmetric strength and tone of shoulders, elbows, wrist, hip, knee and ankles bilaterally.  Back Exam:  Inspection: Unremarkable  Motion: Flexion 45 deg, Extension 45 deg, Side Bending to 45 deg bilaterally,  Rotation to 45 deg bilaterally  SLR laying: Negative  XSLR laying: Negative  Palpable tenderness: TTP in the LL left . FABER: negative. Sensory change: Gross sensation intact to all lumbar and sacral dermatomes.  Reflexes: 2+ at both patellar tendons, 2+ at achilles tendons, Babinski's downgoing.  Strength at foot  Plantar-flexion: 5/5 Dorsi-flexion: 5/5 Eversion: 5/5 Inversion: 5/5  Leg strength  Quad: 5/5 Hamstring: 5/5 Hip flexor: 5/5 Hip abductors: 5/5     Osteopathic findings C2 flexed rotated and side bent right C4 flexed rotated and side bent left C6 flexed rotated and side bent left T3 extended rotated and side bent right inhaled third rib T9 extended rotated and side bent left L2 flexed rotated and side bent right Sacrum right on right     Impression and Recommendations:     This case required medical decision making of moderate complexity. The above documentation has been reviewed and is accurate and complete [name], DO       Note: This dictation was prepared with Dragon dictation along with smaller phrase technology. Any transcriptional errors that result from this process are unintentional.

## 2019-08-14 NOTE — Assessment & Plan Note (Signed)
Stable overall, but possibly recurrent, will discuss injections if needed in the near future.  Home exercises, follow-up again in 4 to 8 weeks

## 2019-08-16 ENCOUNTER — Encounter: Payer: Self-pay | Admitting: Family Medicine

## 2019-08-16 ENCOUNTER — Ambulatory Visit: Payer: BC Managed Care – PPO | Admitting: Family Medicine

## 2019-08-16 ENCOUNTER — Other Ambulatory Visit: Payer: Self-pay

## 2019-08-16 VITALS — BP 130/88 | HR 103 | Ht 63.0 in | Wt 163.0 lb

## 2019-08-16 DIAGNOSIS — M999 Biomechanical lesion, unspecified: Secondary | ICD-10-CM | POA: Diagnosis not present

## 2019-08-16 DIAGNOSIS — M7602 Gluteal tendinitis, left hip: Secondary | ICD-10-CM | POA: Diagnosis not present

## 2019-08-16 NOTE — Patient Instructions (Signed)
See me again in 6 weeks 

## 2019-08-17 ENCOUNTER — Encounter: Payer: Self-pay | Admitting: Neurology

## 2019-08-17 ENCOUNTER — Other Ambulatory Visit: Payer: Self-pay

## 2019-08-17 ENCOUNTER — Telehealth (INDEPENDENT_AMBULATORY_CARE_PROVIDER_SITE_OTHER): Payer: BC Managed Care – PPO | Admitting: Neurology

## 2019-08-17 VITALS — Ht 63.0 in | Wt 157.0 lb

## 2019-08-17 DIAGNOSIS — R55 Syncope and collapse: Secondary | ICD-10-CM | POA: Diagnosis not present

## 2019-08-17 NOTE — Progress Notes (Signed)
Virtual Visit via Video Note The purpose of this virtual visit is to provide medical care while limiting exposure to the novel coronavirus.    Consent was obtained for video visit:  Yes.   Answered questions that patient had about telehealth interaction:  Yes.   I discussed the limitations, risks, security and privacy concerns of performing an evaluation and management service by telemedicine. I also discussed with the patient that there may be a patient responsible charge related to this service. The patient expressed understanding and agreed to proceed.  Pt location: Home Physician Location: office Name of referring provider:  Cherre Robins, PA* I connected with Carrie [name] at patients initiation/request on 08/17/2019 at  9:50 AM EDT by video enabled telemedicine application and verified that I am speaking with the correct person using two identifiers. Pt MRN:  [id number] Pt DOB:  01-21-1971 Video Participants:  Carrie [name]   History of Present Illness:  This is a very pleasant 48 year old right-handed woman with a history of endometriosis, in her usual state of health until 07/18/2019 when she had a syncopal episode in the bathroom at [facility]. She reports being under a lot of stress the day prior. That evening she drank more beer than usual (usually drinks 4-5 beers 5x a week, that evening had 9 beers), went to bed at 11pm. She recalls having a leg cramp (which happens regularly) which woke her up, she got up to use the bathroom and felt dizzy and lost her balance. She recalls the room spinning, she stumbled and fell in between the commode and shower. Her husband heard the fall and she recalls saying she fell down. He reached down to help her and she slumped over and passed out, with urinary incontinence. She had hit the right frontal region and bruised her bottom, her husband pulled her up and lay her down. She was moaning and he reported her right hand was in a fist, opening up  when he touched it. She woke up to her husband calling her and felt diffusely weak. She recalls needing to have a bowel movement and felt weak, sweaty, and shaky sitting on the commode. She got back in bed but could not get up, feeling very dizzy. She woke up at 5am worried and out of sorts to tell her work she could not come in. Her husband took her to the ER where she felt dizzy, no weakness or nausea. In the ER, her WBC was 15.1, otherwise bloodwork unremarkable. UDS positive for benzodiazepines (she is on Xanax). I personally reviewed head CT without contrast which did not show any acute changes. She denies any prior history of syncope. She denies any staring/unresponsive episodes, gaps in time, olfactory/gustatory hallucinations, deja vu, rising epigastric sensation, focal numbness/tingling/weakness, myoclonic jerks. No headaches (improved after daith piercing), dizziness, diplopia, dysarthria/dyspahgia, back pain. She has some urinary urgency and constipation. She has a lot of neck tension. She reports intentional weight loss of 25 lbs since January with [employer]. She had a normal birth and early development.  There is no history of febrile convulsions, CNS infections such as meningitis/encephalitis, significant traumatic brain injury, neurosurgical procedures, or family history of seizures.   PAST MEDICAL HISTORY: Past Medical History:  Diagnosis Date   Endometriosis     PAST SURGICAL HISTORY: Past Surgical History:  Procedure Laterality Date   LAPAROSCOPIC OOPHERECTOMY Left     MEDICATIONS: Current Outpatient Medications on File Prior to Visit  Medication Sig Dispense Refill  ALPRAZolam (XANAX) 0.25 MG tablet Take 0.25 mg by mouth 3 (three) times daily as needed for anxiety.     B Complex-C (B-COMPLEX WITH VITAMIN C) tablet Take 1 tablet by mouth daily.     Calcium Citrate-Vitamin D (CALCIUM + D PO) Take by mouth 2 (two) times daily. Calcium 1,000 mg and Vit D 3 100 IU      gabapentin (NEURONTIN) 100 MG capsule TAKE 2 CAPSULES BY MOUTH 2 TIMES A DAY 180 capsule 1   gabapentin (NEURONTIN) 300 MG capsule TAKE 1 CAPSULE BY MOUTH NIGHTLY 90 capsule 1   levothyroxine (SYNTHROID, LEVOTHROID) 112 MCG tablet Take 112 mcg by mouth daily before breakfast.     Melatonin 10 MG CAPS Take 10 mg by mouth.     Norethin Ace-Eth Estrad-FE (GILDESS FE 1/20 PO) Take 1 tablet by mouth daily.     venlafaxine XR (EFFEXOR-XR) 75 MG 24 hr capsule TAKE 1 CAPSULE BY MOUTH DAILY WITH BREAKFAST. 90 capsule 1   No current facility-administered medications on file prior to visit.     ALLERGIES: No Known Allergies  FAMILY HISTORY: Family History  Problem Relation Age of Onset   Diabetes Father    Renal Disease Father        Current Outpatient Medications on File Prior to Visit  Medication Sig Dispense Refill   ALPRAZolam (XANAX) 0.25 MG tablet Take 0.25 mg by mouth 3 (three) times daily as needed for anxiety.     B Complex-C (B-COMPLEX WITH VITAMIN C) tablet Take 1 tablet by mouth daily.     Calcium Citrate-Vitamin D (CALCIUM + D PO) Take by mouth 2 (two) times daily. Calcium 1,000 mg and Vit D 3 100 IU     gabapentin (NEURONTIN) 100 MG capsule TAKE 2 CAPSULES BY MOUTH 2 TIMES A DAY 180 capsule 1   gabapentin (NEURONTIN) 300 MG capsule TAKE 1 CAPSULE BY MOUTH NIGHTLY 90 capsule 1   levothyroxine (SYNTHROID, LEVOTHROID) 112 MCG tablet Take 112 mcg by mouth daily before breakfast.     Melatonin 10 MG CAPS Take 10 mg by mouth.     Norethin Ace-Eth Estrad-FE (GILDESS FE 1/20 PO) Take 1 tablet by mouth daily.     venlafaxine XR (EFFEXOR-XR) 75 MG 24 hr capsule TAKE 1 CAPSULE BY MOUTH DAILY WITH BREAKFAST. 90 capsule 1   No current facility-administered medications on file prior to visit.      Observations/Objective:   Vitals:   08/17/19 0810  Weight: 157 lb (71.2 kg)  Height: 5\' 3"  (1.6 m)   GEN:  The patient appears stated age and is in NAD.  Neurological  examination: Patient is awake, alert, oriented x 3. No aphasia or dysarthria. Intact fluency and comprehension. Remote and recent memory intact. Able to name and repeat. Cranial nerves: Extraocular movements intact with no nystagmus. No facial asymmetry. Motor: moves all extremities symmetrically, at least anti-gravity x 4. No incoordination on finger to nose testing. Gait: narrow-based and steady, able to tandem walk adequately. Negative Romberg test.  Assessment and Plan:   This is a very pleasant 48 year old right-handed woman with a history of endometriosis who had a syncopal episode last 07/18/2019 early morning in the bathroom. She had urinary incontinence and was slightly confused after, concern for seizure was raised. She reports drinking more alcohol than usual that evening, symptoms suggestive of syncope, likely due to orthostasis potentiated by alcohol intake. Her neurological exam (although limited on video) is normal, no clear epilepsy risk factors. Head  CT unremarkable. We discussed doing a routine EEG for completion. She was advised to start reducing alcohol intake. [place] driving laws discussed, no driving after an episode of loss of consciousness until 6 months event-free. Our office will call with EEG results, if normal, follow-up as needed, she knows to call for any changes.    Follow Up Instructions:   -I discussed the assessment and treatment plan with the patient. The patient was provided an opportunity to ask questions and all were answered. The patient agreed with the plan and demonstrated an understanding of the instructions.   The patient was advised to call back or seek an in-person evaluation if the symptoms worsen or if the condition fails to improve as anticipated.    Cameron Sprang, MD

## 2019-09-07 ENCOUNTER — Encounter: Payer: Self-pay | Admitting: Family Medicine

## 2019-09-27 ENCOUNTER — Ambulatory Visit: Payer: BC Managed Care – PPO | Admitting: Family Medicine

## 2019-10-02 ENCOUNTER — Other Ambulatory Visit: Payer: Self-pay | Admitting: Family Medicine

## 2019-10-04 ENCOUNTER — Other Ambulatory Visit: Payer: Self-pay

## 2019-10-04 ENCOUNTER — Ambulatory Visit: Payer: BC Managed Care – PPO | Admitting: Family Medicine

## 2019-10-04 ENCOUNTER — Ambulatory Visit: Payer: Self-pay

## 2019-10-04 VITALS — BP 122/80 | HR 99 | Ht 63.0 in | Wt 163.0 lb

## 2019-10-04 DIAGNOSIS — M999 Biomechanical lesion, unspecified: Secondary | ICD-10-CM

## 2019-10-04 DIAGNOSIS — M7551 Bursitis of right shoulder: Secondary | ICD-10-CM

## 2019-10-04 NOTE — Assessment & Plan Note (Signed)
Repeat injection 1 more time in the shoulder more in the subacromial area.  Concern for more of a labral pathology.  Discussed the possibility of PRP or an MRI.  Patient will consider this.  Discussed icing regimen and home exercises.  Discussed follow-up 5 to 6 weeks

## 2019-10-04 NOTE — Assessment & Plan Note (Signed)
Decision today to treat with OMT was based on Physical Exam  After verbal consent patient was treated with HVLA, ME, FPR techniques in cervical, thoracic, rib lumbar and sacral areas  Patient tolerated the procedure well with improvement in symptoms  Patient given exercises, stretches and lifestyle modifications  See medications in patient instructions if given  Patient will follow up in 5-6 weeks

## 2019-10-04 NOTE — Progress Notes (Signed)
[name] [place], [place] [address] Phone: [phone] Subjective:   [name], am serving as a scribe for Dr. Hulan Saas.  This visit occurred during the SARS-CoV-2 public health emergency.  Safety protocols were in place, including screening questions prior to the visit, additional usage of staff PPE, and extensive cleaning of exam room while observing appropriate contact time as indicated for disinfecting solutions.       This visit occurred during the SARS-CoV-2 public health emergency.  Safety protocols were in place, including screening questions prior to the visit, additional usage of staff PPE, and extensive cleaning of exam room while observing appropriate contact time as indicated for disinfecting solutions.     CC: Shoulder pain, back pain follow-up  [id number]  Carrie [name] is a 48 y.o. female coming in with complaint of back pain. Last seen on 08/16/2019 for OMT. Has not had any increase in back pain since last visit.  Patient states that her right shoulder continues to bother her especially with extension.  Patient can do most daily activities.  Continues to have some difficulty.  When you know what else can be done.    Past Medical History:  Diagnosis Date  . Endometriosis    Past Surgical History:  Procedure Laterality Date  . LAPAROSCOPIC OOPHERECTOMY Left    Social History   Socioeconomic History  . Marital status: Married    Spouse name: Not on file  . Number of children: Not on file  . Years of education: Not on file  . Highest education level: Not on file  Occupational History  . Not on file  Social Needs  . Financial resource strain: Not on file  . Food insecurity    Worry: Not on file    Inability: Not on file  . Transportation needs    Medical: Not on file    Non-medical: Not on file  Tobacco Use  . Smoking status: Former Research scientist (life sciences)  . Smokeless tobacco: Never Used  Substance and  Sexual Activity  . Alcohol use: Not Currently  . Drug use: Never  . Sexual activity: Yes    Partners: Male    Birth control/protection: Pill  Lifestyle  . Physical activity    Days per week: Not on file    Minutes per session: Not on file  . Stress: Not on file  Relationships  . Social Herbalist on phone: Not on file    Gets together: Not on file    Attends religious service: Not on file    Active member of club or organization: Not on file    Attends meetings of clubs or organizations: Not on file    Relationship status: Not on file  Other Topics Concern  . Not on file  Social History Narrative   Right handed      Highest level of edu- 2 year college      One story home   No Known Allergies Family History  Problem Relation Age of Onset  . Diabetes Father   . Renal Disease Father     Current Outpatient Medications (Endocrine & Metabolic):  .  levothyroxine (SYNTHROID, LEVOTHROID) 112 MCG tablet, Take 112 mcg by mouth daily before breakfast. .  Norethin Ace-Eth Estrad-FE (GILDESS FE 1/20 PO), Take 1 tablet by mouth daily.      Current Outpatient Medications (Other):  Marland Kitchen  ALPRAZolam (XANAX) 0.25 MG tablet, Take 0.25 mg by mouth 3 (  three) times daily as needed for anxiety. .  B Complex-C (B-COMPLEX WITH VITAMIN C) tablet, Take 1 tablet by mouth daily. .  Calcium Citrate-Vitamin D (CALCIUM + D PO), Take by mouth 2 (two) times daily. Calcium 1,000 mg and Vit D 3 100 IU .  gabapentin (NEURONTIN) 100 MG capsule, TAKE 2 CAPSULES BY MOUTH 2 TIMES A DAY .  gabapentin (NEURONTIN) 300 MG capsule, TAKE 1 CAPSULE BY MOUTH NIGHTLY .  Melatonin 10 MG CAPS, Take 10 mg by mouth. .  venlafaxine XR (EFFEXOR-XR) 75 MG 24 hr capsule, TAKE 1 CAPSULE BY MOUTH DAILY WITH BREAKFAST.    Past medical history, social, surgical and family history all reviewed in electronic medical record.  No pertanent information unless stated regarding to the chief complaint.   Review of  Systems:  No headache, visual changes, nausea, vomiting, diarrhea, constipation, dizziness, abdominal pain, skin rash, fevers, chills, night sweats, weight loss, swollen lymph nodes, body aches, joint swelling,  chest pain, shortness of breath, mood changes.  Positive muscle aches  Objective  Blood pressure 122/80, pulse 99, height 5\' 3"  (1.6 m), weight 163 lb (73.9 kg), SpO2 98 %.    General: No apparent distress alert and oriented x3 mood and affect normal, dressed appropriately.  HEENT: Pupils equal, extraocular movements intact  Respiratory: Patient's speak in full sentences and does not appear short of breath  Cardiovascular: No lower extremity edema, non tender, no erythema  Skin: Warm dry intact with no signs of infection or rash on extremities or on axial skeleton.  Abdomen: Soft nontender  Neuro: Cranial nerves II through XII are intact, neurovascularly intact in all extremities with 2+ DTRs and 2+ pulses.  Lymph: No lymphadenopathy of posterior or anterior cervical chain or axillae bilaterally.  Gait normal with good balance and coordination.  MSK:  Non tender with full range of motion and good stability and symmetric strength and tone of  elbows, wrist, hip, knee and ankles bilaterally.  Right shoulder exam still has some pain with external rotation.  Positive impingement.  Rotator cuff strength 4+ out of 5.  Contralateral side unremarkable.  Positive O'Brien's  Back exam mild loss of lordosis.  FABER test positive on the left.  Negative straight leg test.  Near full range of motion lacking the last 10 degrees of extension.  Sidebending only 5 degrees on the right secondary to pain  Procedure: Real-time Ultrasound Guided Injection of right glenohumeral joint Device: GE Logiq Q7  Ultrasound guided injection is preferred based studies that show increased duration, increased effect, greater accuracy, decreased procedural pain, increased response rate with ultrasound guided versus blind  injection.  Verbal informed consent obtained.  Time-out conducted.  Noted no overlying erythema, induration, or other signs of local infection.  Skin prepped in a sterile fashion.  Local anesthesia: Topical Ethyl chloride.  With sterile technique and under real time ultrasound guidance:  Joint visualized.  23g 1  inch needle inserted posterior approach. Pictures taken for needle placement. Patient did have injection of 2 cc of 1% lidocaine, 2 cc of 0.5% Marcaine, and 1.0 cc of Kenalog 40 mg/dL. Completed without difficulty  Pain immediately resolved suggesting accurate placement of the medication.  Advised to call if fevers/chills, erythema, induration, drainage, or persistent bleeding.  Images permanently stored and available for review in the ultrasound unit.  Impression: Technically successful ultrasound guided injection.  Osteopathic findings  C2 flexed rotated and side bent right C4 flexed rotated and side bent left C6 flexed rotated and  side bent left T3 extended rotated and side bent right inhaled third rib T9 extended rotated and side bent left L2 flexed rotated and side bent right Sacrum left on left    Impression and Recommendations:     This case required medical decision making of moderate complexity. The above documentation has been reviewed and is accurate and complete [name], DO       Note: This dictation was prepared with Dragon dictation along with smaller phrase technology. Any transcriptional errors that result from this process are unintentional.

## 2019-10-04 NOTE — Patient Instructions (Addendum)
  Injected shoulder today See me again in 5-6 weeks    [address] floor [place], [place] [address] Phone [phone]

## 2019-10-05 ENCOUNTER — Encounter: Payer: Self-pay | Admitting: Family Medicine

## 2019-10-06 ENCOUNTER — Ambulatory Visit: Payer: BC Managed Care – PPO | Admitting: Neurology

## 2019-11-09 ENCOUNTER — Ambulatory Visit (INDEPENDENT_AMBULATORY_CARE_PROVIDER_SITE_OTHER): Payer: BC Managed Care – PPO | Admitting: Family Medicine

## 2019-11-09 ENCOUNTER — Encounter: Payer: Self-pay | Admitting: Family Medicine

## 2019-11-09 ENCOUNTER — Other Ambulatory Visit: Payer: Self-pay

## 2019-11-09 VITALS — BP 112/80 | HR 90 | Ht 63.0 in | Wt 168.0 lb

## 2019-11-09 DIAGNOSIS — M7551 Bursitis of right shoulder: Secondary | ICD-10-CM

## 2019-11-09 DIAGNOSIS — M999 Biomechanical lesion, unspecified: Secondary | ICD-10-CM

## 2019-11-09 DIAGNOSIS — G5702 Lesion of sciatic nerve, left lower limb: Secondary | ICD-10-CM | POA: Diagnosis not present

## 2019-11-09 NOTE — Progress Notes (Signed)
[facility] [address] [place] Phone: [phone] Subjective:   I [name] am serving as a Education administrator for Dr. Hulan Saas.  This visit occurred during the SARS-CoV-2 public health emergency.  Safety protocols were in place, including screening questions prior to the visit, additional usage of staff PPE, and extensive cleaning of exam room while observing appropriate contact time as indicated for disinfecting solutions.    CC: Back pain follow-up, shoulder pain follow-up  [id number]   10/04/2019  OMT  11/09/2019 LORRINE WILLOCKS is a 49 y.o. female coming in with complaint of back and right shoulder pain. Lower back is much better. States the shoulder is getting there.  Patient still significantly quite distressed that she has previously.  Continue existing medication list.  Feels like they have helped her somewhat.       Past Medical History:  Diagnosis Date  . Endometriosis    Past Surgical History:  Procedure Laterality Date  . LAPAROSCOPIC OOPHERECTOMY Left    Social History   Socioeconomic History  . Marital status: Married    Spouse name: Not on file  . Number of children: Not on file  . Years of education: Not on file  . Highest education level: Not on file  Occupational History  . Not on file  Tobacco Use  . Smoking status: Former Research scientist (life sciences)  . Smokeless tobacco: Never Used  Substance and Sexual Activity  . Alcohol use: Not Currently  . Drug use: Never  . Sexual activity: Yes    Partners: Male    Birth control/protection: Pill  Other Topics Concern  . Not on file  Social History Narrative   Right handed      Highest level of edu- 2 year college      One story home   Social Determinants of Health   Financial Resource Strain:   . Difficulty of Paying Living Expenses: Not on file  Food Insecurity:   . Worried About Charity fundraiser in the Last Year: Not on file  . Ran Out of Food in the Last Year:  Not on file  Transportation Needs:   . Lack of Transportation (Medical): Not on file  . Lack of Transportation (Non-Medical): Not on file  Physical Activity:   . Days of Exercise per Week: Not on file  . Minutes of Exercise per Session: Not on file  Stress:   . Feeling of Stress : Not on file  Social Connections:   . Frequency of Communication with Friends and Family: Not on file  . Frequency of Social Gatherings with Friends and Family: Not on file  . Attends Religious Services: Not on file  . Active Member of Clubs or Organizations: Not on file  . Attends Archivist Meetings: Not on file  . Marital Status: Not on file   No Known Allergies Family History  Problem Relation Age of Onset  . Diabetes Father   . Renal Disease Father     Current Outpatient Medications (Endocrine & Metabolic):  .  levothyroxine (SYNTHROID, LEVOTHROID) 112 MCG tablet, Take 112 mcg by mouth daily before breakfast. .  Norethin Ace-Eth Estrad-FE (GILDESS FE 1/20 PO), Take 1 tablet by mouth daily.      Current Outpatient Medications (Other):  Marland Kitchen  ALPRAZolam (XANAX) 0.25 MG tablet, Take 0.25 mg by mouth 3 (three) times daily as needed for anxiety. .  B Complex-C (B-COMPLEX WITH VITAMIN C) tablet, Take 1 tablet by mouth  daily. .  Calcium Citrate-Vitamin D (CALCIUM + D PO), Take by mouth 2 (two) times daily. Calcium 1,000 mg and Vit D 3 100 IU .  gabapentin (NEURONTIN) 100 MG capsule, TAKE 2 CAPSULES BY MOUTH 2 TIMES A DAY .  gabapentin (NEURONTIN) 300 MG capsule, TAKE 1 CAPSULE BY MOUTH NIGHTLY .  Melatonin 10 MG CAPS, Take 10 mg by mouth. .  venlafaxine XR (EFFEXOR-XR) 75 MG 24 hr capsule, TAKE 1 CAPSULE BY MOUTH DAILY WITH BREAKFAST.    Past medical history, social, surgical and family history all reviewed in electronic medical record.  No pertanent information unless stated regarding to the chief complaint.   Review of Systems:  No headache, visual changes, nausea, vomiting, diarrhea,  constipation, dizziness, abdominal pain, skin rash, fevers, chills, night sweats, weight loss, swollen lymph nodes, body aches, joint swelling,  chest pain, shortness of breath, mood changes.  Mild positive muscle aches  Objective  Blood pressure 112/80, pulse 90, height 5\' 3"  (1.6 m), weight 168 lb (76.2 kg), SpO2 98 %.    General: No apparent distress alert and oriented x3 mood and affect normal, dressed appropriately.  HEENT: Pupils equal, extraocular movements intact  Respiratory: Patient's speak in full sentences and does not appear short of breath  Cardiovascular: No lower extremity edema, non tender, no erythema  Skin: Warm dry intact with no signs of infection or rash on extremities or on axial skeleton.  Abdomen: Soft nontender  Neuro: Cranial nerves II through XII are intact, neurovascularly intact in all extremities with 2+ DTRs and 2+ pulses.  Lymph: No lymphadenopathy of posterior or anterior cervical chain or axillae bilaterally.  Gait normal with good balance and coordination.  MSK:  tender with full range of motion and good stability and symmetric strength and tone of shoulders, elbows, wrist, hip, knee and ankles bilaterally.  ) Returning relatively well.  Does have more tenderness to palpation over the parascapular region. Patient's low back does have pain around the sacroiliac joint, seems to be more left greater than right.  Mild positive Corky Sox on the left side.  Negative straight leg test.  Near full range of motion lacking the last 5 degrees of flexion and extension  Osteopathic findings  C2 flexed rotated and side bent right T3 extended rotated and side bent right inhaled third rib T9 extended rotated and side bent left L2 flexed rotated and side bent right Sacrum left on left    Impression and Recommendations:     This case required medical decision making of moderate complexity. The above documentation has been reviewed and is accurate and complete [name], DO       Note: This dictation was prepared with Dragon dictation along with smaller phrase technology. Any transcriptional errors that result from this process are unintentional.

## 2019-11-09 NOTE — Assessment & Plan Note (Signed)
Decision today to treat with OMT was based on Physical Exam  After verbal consent patient was treated with HVLA, ME, FPR techniques in cervical, thoracic, rib,  lumbar and sacral areas  Patient tolerated the procedure well with improvement in symptoms  Patient given exercises, stretches and lifestyle modifications  See medications in patient instructions if given  Patient will follow up in 4-8 weeks 

## 2019-11-09 NOTE — Assessment & Plan Note (Signed)
Mild tenderness still noted.  Continues to have some discomfort and pain.  Patient does have still tightness in the lower back but I think he is going to do relatively well with conservative therapy.  Follow-up again in 4 to 8 weeks

## 2019-11-09 NOTE — Assessment & Plan Note (Signed)
Improvement noted in the morning but if continues to have trouble we will consider MRI

## 2019-11-09 NOTE — Patient Instructions (Addendum)
Good to see you See me again in 6 weeks 

## 2019-12-20 ENCOUNTER — Other Ambulatory Visit: Payer: Self-pay

## 2019-12-20 ENCOUNTER — Ambulatory Visit: Payer: BC Managed Care – PPO | Admitting: Family Medicine

## 2019-12-20 ENCOUNTER — Encounter: Payer: Self-pay | Admitting: Family Medicine

## 2019-12-20 VITALS — BP 110/80 | HR 86 | Ht 63.0 in | Wt 168.0 lb

## 2019-12-20 DIAGNOSIS — G5702 Lesion of sciatic nerve, left lower limb: Secondary | ICD-10-CM

## 2019-12-20 DIAGNOSIS — M999 Biomechanical lesion, unspecified: Secondary | ICD-10-CM | POA: Diagnosis not present

## 2019-12-20 NOTE — Assessment & Plan Note (Signed)
Decision today to treat with OMT was based on Physical Exam  After verbal consent patient was treated with HVLA, ME, FPR techniques in cervical, thoracic, rib,  lumbar and sacral areas  Patient tolerated the procedure well with improvement in symptoms  Patient given exercises, stretches and lifestyle modifications  See medications in patient instructions if given  Patient will follow up in 4-8 weeks 

## 2019-12-20 NOTE — Progress Notes (Signed)
[facility] [address] [place] Phone: [phone] Subjective:   I Carrie [name] am serving as a Education administrator for Dr. Hulan Saas.  This visit occurred during the SARS-CoV-2 public health emergency.  Safety protocols were in place, including screening questions prior to the visit, additional usage of staff PPE, and extensive cleaning of exam room while observing appropriate contact time as indicated for disinfecting solutions.   I'm seeing this patient by the request  of:  [name], [name], MD  CC: Low back pain follow-up  [id number]  Carrie [name] is a 49 y.o. female coming in with complaint of back pain. Patient states she is doing well. OMT. Some days are better than others.  Some tightness noted actually more in the upper back she states today.  Some neck pain.  Patient states not as severe in the buttocks area.  Has been sitting more and feels like that is sometimes exacerbation.       Past Medical History:  Diagnosis Date  . Endometriosis    Past Surgical History:  Procedure Laterality Date  . LAPAROSCOPIC OOPHERECTOMY Left    Social History   Socioeconomic History  . Marital status: Married    Spouse name: Not on file  . Number of children: Not on file  . Years of education: Not on file  . Highest education level: Not on file  Occupational History  . Not on file  Tobacco Use  . Smoking status: Former Research scientist (life sciences)  . Smokeless tobacco: Never Used  Substance and Sexual Activity  . Alcohol use: Not Currently  . Drug use: Never  . Sexual activity: Yes    Partners: Male    Birth control/protection: Pill  Other Topics Concern  . Not on file  Social History Narrative   Right handed      Highest level of edu- 2 year college      One story home   Social Determinants of Health   Financial Resource Strain:   . Difficulty of Paying Living Expenses: Not on file  Food Insecurity:   . Worried About Charity fundraiser  in the Last Year: Not on file  . Ran Out of Food in the Last Year: Not on file  Transportation Needs:   . Lack of Transportation (Medical): Not on file  . Lack of Transportation (Non-Medical): Not on file  Physical Activity:   . Days of Exercise per Week: Not on file  . Minutes of Exercise per Session: Not on file  Stress:   . Feeling of Stress : Not on file  Social Connections:   . Frequency of Communication with Friends and Family: Not on file  . Frequency of Social Gatherings with Friends and Family: Not on file  . Attends Religious Services: Not on file  . Active Member of Clubs or Organizations: Not on file  . Attends Archivist Meetings: Not on file  . Marital Status: Not on file   No Known Allergies Family History  Problem Relation Age of Onset  . Diabetes Father   . Renal Disease Father     Current Outpatient Medications (Endocrine & Metabolic):  .  levothyroxine (SYNTHROID, LEVOTHROID) 112 MCG tablet, Take 112 mcg by mouth daily before breakfast. .  Norethin Ace-Eth Estrad-FE (GILDESS FE 1/20 PO), Take 1 tablet by mouth daily.      Current Outpatient Medications (Other):  Marland Kitchen  ALPRAZolam (XANAX) 0.25 MG tablet, Take 0.25 mg by mouth 3 (  three) times daily as needed for anxiety. .  B Complex-C (B-COMPLEX WITH VITAMIN C) tablet, Take 1 tablet by mouth daily. .  Calcium Citrate-Vitamin D (CALCIUM + D PO), Take by mouth 2 (two) times daily. Calcium 1,000 mg and Vit D 3 100 IU .  gabapentin (NEURONTIN) 100 MG capsule, TAKE 2 CAPSULES BY MOUTH 2 TIMES A DAY .  gabapentin (NEURONTIN) 300 MG capsule, TAKE 1 CAPSULE BY MOUTH NIGHTLY .  Melatonin 10 MG CAPS, Take 10 mg by mouth. .  venlafaxine XR (EFFEXOR-XR) 75 MG 24 hr capsule, TAKE 1 CAPSULE BY MOUTH DAILY WITH BREAKFAST.   Reviewed prior external information including notes and imaging from  primary care provider As well as notes that were available from care everywhere and other healthcare systems.  Past  medical history, social, surgical and family history all reviewed in electronic medical record.  No pertanent information unless stated regarding to the chief complaint.   Review of Systems:  No headache, visual changes, nausea, vomiting, diarrhea, constipation, dizziness, abdominal pain, skin rash, fevers, chills, night sweats, weight loss, swollen lymph nodes, body aches, joint swelling, chest pain, shortness of breath, mood changes. POSITIVE muscle aches  Objective  Blood pressure 110/80, pulse 86, height 5\' 3"  (1.6 m), weight 168 lb (76.2 kg), SpO2 98 %.   General: No apparent distress alert and oriented x3 mood and affect normal, dressed appropriately.  HEENT: Pupils equal, extraocular movements intact  Respiratory: Patient's speak in full sentences and does not appear short of breath  Cardiovascular: No lower extremity edema, non tender, no erythema  Skin: Warm dry intact with no signs of infection or rash on extremities or on axial skeleton.  Abdomen: Soft nontender  Neuro: Cranial nerves II through XII are intact, neurovascularly intact in all extremities with 2+ DTRs and 2+ pulses.  Lymph: No lymphadenopathy of posterior or anterior cervical chain or axillae bilaterally.  Gait normal with good balance and coordination.  MSK:  tender with full range of motion and good stability and symmetric strength and tone of shoulders, elbows, wrist, hip, knee and ankles bilaterally.  Neck exam has some mild loss of lordosis.  Tightness noted in the parascapular region left greater than right.  Patient has some slight bruising still noted.  5 out of 5 strength of the upper extremities bilaterally mild pain in the thoracolumbar junction.  Still mild pain over the sacroiliac joint and mild positive Corky Sox.  Minimal tenderness in the piriformis area.  Osteopathic findings  C6 flexed rotated and side bent left T3 extended rotated and side bent right inhaled third rib T9 extended rotated and side bent  left L2 flexed rotated and side bent right Sacrum left on left   Impression and Recommendations:     This case required medical decision making of moderate complexity. The above documentation has been reviewed and is accurate and complete Carrie Pulley, DO       Note: This dictation was prepared with Dragon dictation along with smaller phrase technology. Any transcriptional errors that result from this process are unintentional.

## 2019-12-20 NOTE — Patient Instructions (Addendum)
Good to see you Continue Prilosec Stay active See me again in 6 weeks

## 2019-12-20 NOTE — Assessment & Plan Note (Signed)
Stable overall.  Still some mild radicular symptoms I think still there but overall has made significant improvement.  Discussed icing regimen and home exercise, topical anti-inflammatories.  Discussed other ergonomic changes.  Chronic problem with exacerbation.  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

## 2019-12-21 ENCOUNTER — Ambulatory Visit: Payer: BC Managed Care – PPO | Admitting: Family Medicine

## 2020-01-29 ENCOUNTER — Other Ambulatory Visit: Payer: Self-pay | Admitting: Family Medicine

## 2020-01-31 ENCOUNTER — Ambulatory Visit: Payer: BC Managed Care – PPO | Admitting: Family Medicine

## 2020-01-31 ENCOUNTER — Encounter: Payer: Self-pay | Admitting: Family Medicine

## 2020-01-31 ENCOUNTER — Other Ambulatory Visit: Payer: Self-pay

## 2020-01-31 VITALS — BP 124/86 | HR 100 | Ht 63.0 in | Wt 170.0 lb

## 2020-01-31 DIAGNOSIS — G5702 Lesion of sciatic nerve, left lower limb: Secondary | ICD-10-CM

## 2020-01-31 DIAGNOSIS — M999 Biomechanical lesion, unspecified: Secondary | ICD-10-CM | POA: Diagnosis not present

## 2020-01-31 NOTE — Assessment & Plan Note (Signed)

## 2020-01-31 NOTE — Patient Instructions (Signed)
See me again in 6-7 weeks ?

## 2020-01-31 NOTE — Progress Notes (Signed)
[facility] [name] [place] Phone: [phone] Subjective:   [name], am serving as a scribe for Dr. Hulan Saas. This visit occurred during the SARS-CoV-2 public health emergency.  Safety protocols were in place, including screening questions prior to the visit, additional usage of staff PPE, and extensive cleaning of exam room while observing appropriate contact time as indicated for disinfecting solutions.   I'm seeing this patient by the request  of:  [name], [name], MD  CC: Back pain follow-up  [id number]   Carrie [name] is a 49 y.o. female coming in with complaint of back pain. Last seen on 12/20/2019. Patient states that she is doing well. Is taking Prilosec which is helping.  Patient has noticed not as much mid back pain.  Still has significant tightness.  When she tries to workout though has some more tightness of the lower back especially in the mornings.  Goes away after minutes.      Past Medical History:  Diagnosis Date  . Endometriosis    Past Surgical History:  Procedure Laterality Date  . LAPAROSCOPIC OOPHERECTOMY Left    Social History   Socioeconomic History  . Marital status: Married    Spouse name: Not on file  . Number of children: Not on file  . Years of education: Not on file  . Highest education level: Not on file  Occupational History  . Not on file  Tobacco Use  . Smoking status: Former Research scientist (life sciences)  . Smokeless tobacco: Never Used  Substance and Sexual Activity  . Alcohol use: Not Currently  . Drug use: Never  . Sexual activity: Yes    Partners: Male    Birth control/protection: Pill  Other Topics Concern  . Not on file  Social History Narrative   Right handed      Highest level of edu- 2 year college      One story home   Social Determinants of Health   Financial Resource Strain:   . Difficulty of Paying Living Expenses:   Food Insecurity:   . Worried About Paediatric nurse in the Last Year:   . Arboriculturist in the Last Year:   Transportation Needs:   . Film/video editor (Medical):   Marland Kitchen Lack of Transportation (Non-Medical):   Physical Activity:   . Days of Exercise per Week:   . Minutes of Exercise per Session:   Stress:   . Feeling of Stress :   Social Connections:   . Frequency of Communication with Friends and Family:   . Frequency of Social Gatherings with Friends and Family:   . Attends Religious Services:   . Active Member of Clubs or Organizations:   . Attends Archivist Meetings:   Marland Kitchen Marital Status:    No Known Allergies Family History  Problem Relation Age of Onset  . Diabetes Father   . Renal Disease Father     Current Outpatient Medications (Endocrine & Metabolic):  .  levothyroxine (SYNTHROID, LEVOTHROID) 112 MCG tablet, Take 112 mcg by mouth daily before breakfast. .  Norethin Ace-Eth Estrad-FE (GILDESS FE 1/20 PO), Take 1 tablet by mouth daily.      Current Outpatient Medications (Other):  Marland Kitchen  ALPRAZolam (XANAX) 0.25 MG tablet, Take 0.25 mg by mouth 3 (three) times daily as needed for anxiety. .  B Complex-C (B-COMPLEX WITH VITAMIN C) tablet, Take 1 tablet by mouth daily. .  Calcium Citrate-Vitamin  D (CALCIUM + D PO), Take by mouth 2 (two) times daily. Calcium 1,000 mg and Vit D 3 100 IU .  gabapentin (NEURONTIN) 100 MG capsule, TAKE 2 CAPSULES BY MOUTH 2 TIMES A DAY .  gabapentin (NEURONTIN) 300 MG capsule, TAKE 1 CAPSULE BY MOUTH NIGHTLY .  Melatonin 10 MG CAPS, Take 10 mg by mouth. .  venlafaxine XR (EFFEXOR-XR) 75 MG 24 hr capsule, TAKE 1 CAPSULE BY MOUTH DAILY WITH BREAKFAST.   Reviewed prior external information including notes and imaging from  primary care provider As well as notes that were available from care everywhere and other healthcare systems.  Past medical history, social, surgical and family history all reviewed in electronic medical record.  No pertanent information unless stated  regarding to the chief complaint.   Review of Systems:  No headache, visual changes, nausea, vomiting, diarrhea, constipation, dizziness, abdominal pain, skin rash, fevers, chills, night sweats, weight loss, swollen lymph nodes, body aches, joint swelling, chest pain, shortness of breath, mood changes. POSITIVE muscle aches  Objective  Blood pressure 124/86, pulse 100, height 5\' 3"  (1.6 m), weight 170 lb (77.1 kg), SpO2 98 %.   General: No apparent distress alert and oriented x3 mood and affect normal, dressed appropriately.  HEENT: Pupils equal, extraocular movements intact  Respiratory: Patient's speak in full sentences and does not appear short of breath  Cardiovascular: No lower extremity edema, non tender, no erythema  Neuro: Cranial nerves II through XII are intact, neurovascularly intact in all extremities with 2+ DTRs and 2+ pulses.  Gait normal with good balance and coordination.  MSK:  Non tender with full range of motion and good stability and symmetric strength and tone of shoulders, elbows, wrist, hip, knee and ankles bilaterally.  Low back exam shows the patient does have some tenderness to palpation of the paraspinal musculature of the lumbar spine, right greater than left.  Osteopathic findings C4 flexed rotated and side bent left C6 flexed rotated and side bent left T3 extended rotated and side bent right inhaled third rib L2 flexed rotated and side bent right L4 flexed rotated and side bent left Sacrum right on right    Impression and Recommendations:     This case required medical decision making of moderate complexity. The above documentation has been reviewed and is accurate and complete [name], DO       Note: This dictation was prepared with Dragon dictation along with smaller phrase technology. Any transcriptional errors that result from this process are unintentional.

## 2020-01-31 NOTE — Assessment & Plan Note (Signed)
Chronic problem : Chronic problem mild exacerbation  interventions previously, including medication management: Injections, manipulation, medications including gabapentin   Interventions this visit: Discussed medications including the gabapentin and continue at the same dosing at the moment.  Continue the Effexor as well for the nerve pain.  Responding well to manipulation We discussed with patient the importance ergonomics, home exercises, icing regimen, and over-the-counter natural products.     Return to clinic: 6 to 7 weeks

## 2020-03-19 ENCOUNTER — Encounter: Payer: Self-pay | Admitting: Family Medicine

## 2020-03-19 ENCOUNTER — Ambulatory Visit: Payer: BC Managed Care – PPO | Admitting: Family Medicine

## 2020-03-19 ENCOUNTER — Other Ambulatory Visit: Payer: Self-pay

## 2020-03-19 VITALS — BP 110/70 | HR 77 | Ht 63.0 in | Wt 172.0 lb

## 2020-03-19 DIAGNOSIS — M4317 Spondylolisthesis, lumbosacral region: Secondary | ICD-10-CM

## 2020-03-19 DIAGNOSIS — M999 Biomechanical lesion, unspecified: Secondary | ICD-10-CM

## 2020-03-19 MED ORDER — KETOROLAC TROMETHAMINE 60 MG/2ML IM SOLN
60.0000 mg | Freq: Once | INTRAMUSCULAR | Status: AC
  Administered 2020-03-19: 60 mg via INTRAMUSCULAR

## 2020-03-19 MED ORDER — METHYLPREDNISOLONE ACETATE 80 MG/ML IJ SUSP
80.0000 mg | Freq: Once | INTRAMUSCULAR | Status: AC
  Administered 2020-03-19: 80 mg via INTRAMUSCULAR

## 2020-03-19 MED ORDER — TIZANIDINE HCL 4 MG PO TABS: 4.0000 mg | ORAL_TABLET | Freq: Four times a day (QID) | ORAL | 1 refills | Status: AC | PRN

## 2020-03-19 NOTE — Assessment & Plan Note (Signed)
Known spondylolisthesis with a chronic exacerbation at the moment.  Discussed with patient's about medications, gabapentin, discussed prednisone, Toradol for pain Depo-Medrol given today.  Discussed which activities to doing which wants to avoid.  Patient will increase activity slowly.  Discussed icing regimen.  Follow-up again in 4 to 8 weeks

## 2020-03-19 NOTE — Patient Instructions (Addendum)
Good to see you zanaflex at night for 3 nights then as needed See me again in 5-6 weeks

## 2020-03-19 NOTE — Assessment & Plan Note (Signed)

## 2020-03-19 NOTE — Progress Notes (Signed)
[place] [address] [place] Phone: [phone] Subjective:   I Carrie [name] am serving as a Education administrator for Dr. Hulan Saas.  This visit occurred during the SARS-CoV-2 public health emergency.  Safety protocols were in place, including screening questions prior to the visit, additional usage of staff PPE, and extensive cleaning of exam room while observing appropriate contact time as indicated for disinfecting solutions.   I'm seeing this patient by the request  of:  [name], [name], MD  CC: Low back pain  [id number]  Carrie [name] is a 49 y.o. female coming in with complaint of back pain. Last seen on 01/31/2020 for OMT. Patient states her lower back is painful for the past 3 weeks. Upper back is painful on occasion. Icing and bracing but pain has not been resolved.  Patient states that it has been more severe than usual.  Seems to be more on the left side.  No radiation down the legs.  Minimal discomfort in the buttocks      Past Medical History:  Diagnosis Date  . Endometriosis    Past Surgical History:  Procedure Laterality Date  . LAPAROSCOPIC OOPHERECTOMY Left    Social History   Socioeconomic History  . Marital status: Married    Spouse name: Not on file  . Number of children: Not on file  . Years of education: Not on file  . Highest education level: Not on file  Occupational History  . Not on file  Tobacco Use  . Smoking status: Former Research scientist (life sciences)  . Smokeless tobacco: Never Used  Substance and Sexual Activity  . Alcohol use: Not Currently  . Drug use: Never  . Sexual activity: Yes    Partners: Male    Birth control/protection: Pill  Other Topics Concern  . Not on file  Social History Narrative   Right handed      Highest level of edu- 2 year college      One story home   Social Determinants of Health   Financial Resource Strain:   . Difficulty of Paying Living Expenses:   Food Insecurity:   .  Worried About Charity fundraiser in the Last Year:   . Arboriculturist in the Last Year:   Transportation Needs:   . Film/video editor (Medical):   Marland Kitchen Lack of Transportation (Non-Medical):   Physical Activity:   . Days of Exercise per Week:   . Minutes of Exercise per Session:   Stress:   . Feeling of Stress :   Social Connections:   . Frequency of Communication with Friends and Family:   . Frequency of Social Gatherings with Friends and Family:   . Attends Religious Services:   . Active Member of Clubs or Organizations:   . Attends Archivist Meetings:   Marland Kitchen Marital Status:    No Known Allergies Family History  Problem Relation Age of Onset  . Diabetes Father   . Renal Disease Father     Current Outpatient Medications (Endocrine & Metabolic):  .  levothyroxine (SYNTHROID, LEVOTHROID) 112 MCG tablet, Take 112 mcg by mouth daily before breakfast. .  Norethin Ace-Eth Estrad-FE (GILDESS FE 1/20 PO), Take 1 tablet by mouth daily.      Current Outpatient Medications (Other):  Marland Kitchen  ALPRAZolam (XANAX) 0.25 MG tablet, Take 0.25 mg by mouth 3 (three) times daily as needed for anxiety. .  B Complex-C (B-COMPLEX WITH VITAMIN C) tablet, Take  1 tablet by mouth daily. .  Calcium Citrate-Vitamin D (CALCIUM + D PO), Take by mouth 2 (two) times daily. Calcium 1,000 mg and Vit D 3 100 IU .  gabapentin (NEURONTIN) 100 MG capsule, TAKE 2 CAPSULES BY MOUTH 2 TIMES A DAY .  gabapentin (NEURONTIN) 300 MG capsule, TAKE 1 CAPSULE BY MOUTH NIGHTLY .  Melatonin 10 MG CAPS, Take 10 mg by mouth. .  venlafaxine XR (EFFEXOR-XR) 75 MG 24 hr capsule, TAKE 1 CAPSULE BY MOUTH DAILY WITH BREAKFAST. Marland Kitchen  tiZANidine (ZANAFLEX) 4 MG tablet, Take 1 tablet (4 mg total) by mouth every 6 (six) hours as needed for muscle spasms.   Reviewed prior external information including notes and imaging from  primary care provider As well as notes that were available from care everywhere and other healthcare  systems.  Past medical history, social, surgical and family history all reviewed in electronic medical record.  No pertanent information unless stated regarding to the chief complaint.   Review of Systems:  No headache, visual changes, nausea, vomiting, diarrhea, constipation, dizziness, abdominal pain, skin rash, fevers, chills, night sweats, weight loss, swollen lymph nodes, , joint swelling, chest pain, shortness of breath, mood changes. POSITIVE muscle aches, body aches  Objective  Blood pressure 110/70, pulse 77, height 5\' 3"  (1.6 m), weight 172 lb (78 kg), SpO2 96 %.   General: No apparent distress alert and oriented x3 mood and affect normal, dressed appropriately.  HEENT: Pupils equal, extraocular movements intact  Respiratory: Patient's speak in full sentences and does not appear short of breath  Cardiovascular: No lower extremity edema, non tender, no erythema  Neuro: Cranial nerves II through XII are intact, neurovascularly intact in all extremities with 2+ DTRs and 2+ pulses.  Gait normal with good balance and coordination.  MSK:  Non tender with full range of motion and good stability and symmetric strength and tone of shoulders, elbows, wrist, hip, knee and ankles bilaterally.  Low back exam does have some loss of lordosis.  Tender to palpation in the paraspinal musculature lumbar spine left greater than right.  Positive pain with Corky Sox on the left.  Negative straight leg test.  Mild increase in discomfort with extension of the back which is new.  Osteopathic findings  C4 flexed rotated and side bent left C6 flexed rotated and side bent left T5 extended rotated and side bent left inhaled rib L2 flexed rotated and side bent right Sacrum left on left   Patient's previous imaging includes an x-ray of her back back in May 2018.  This was independently visualized by me showing degenerative disc and facet arthropathy mostly at L4-L5 with a grade 1 spondylolisthesis Impression and  Recommendations:     This case required medical decision making of moderate complexity. The above documentation has been reviewed and is accurate and complete [name], DO       Note: This dictation was prepared with Dragon dictation along with smaller phrase technology. Any transcriptional errors that result from this process are unintentional.

## 2020-04-15 ENCOUNTER — Other Ambulatory Visit: Payer: Self-pay | Admitting: Family Medicine

## 2020-05-02 ENCOUNTER — Other Ambulatory Visit: Payer: Self-pay

## 2020-05-02 ENCOUNTER — Ambulatory Visit: Payer: BC Managed Care – PPO | Admitting: Family Medicine

## 2020-05-02 ENCOUNTER — Encounter: Payer: Self-pay | Admitting: Family Medicine

## 2020-05-02 VITALS — BP 118/72 | HR 84 | Ht 63.0 in | Wt 175.0 lb

## 2020-05-02 DIAGNOSIS — M4317 Spondylolisthesis, lumbosacral region: Secondary | ICD-10-CM

## 2020-05-02 DIAGNOSIS — M999 Biomechanical lesion, unspecified: Secondary | ICD-10-CM | POA: Diagnosis not present

## 2020-05-02 NOTE — Patient Instructions (Signed)
Thanks for the band name Duexis 3x a day for 3 days Keep taking muscle relaxer for next two nights Let someone else paint See me in 6 weeks

## 2020-05-02 NOTE — Progress Notes (Signed)
[place] Phone: [phone] Subjective:   Carrie [name], am serving as a scribe for Dr. Hulan Saas. This visit occurred during the SARS-CoV-2 public health emergency.  Safety protocols were in place, including screening questions prior to the visit, additional usage of staff PPE, and extensive cleaning of exam room while observing appropriate contact time as indicated for disinfecting solutions.   I'm seeing this patient by the request  of:  [name], [name], MD  CC: Neck pain and lower back pain.  [id number]  Carrie [name] is a 49 y.o. female coming in with complaint of back and neck pain. OMT 03/19/2020. Patient states that she is having tightness and pinching in left trap. Otherwise has not had any issues since last visit.  Patient has been painting recently overhead that has been giving him discomfort and pain. Medications patient has been prescribed: Gabapentin but she has been taking only intermittently and has not been taking any of the muscle relaxer.        Reviewed prior external information including notes and imaging from previsou exam, outside providers and external EMR if available.   As well as notes that were available from care everywhere and other healthcare systems.  Past medical history, social, surgical and family history all reviewed in electronic medical record.  [name] pertanent information unless stated regarding to the chief complaint.   Past Medical History:  Diagnosis Date  . Endometriosis     [name] Known Allergies [name] known drug allergies   Review of Systems:  [name] headache, visual changes, nausea, vomiting, diarrhea, constipation, dizziness, abdominal pain, skin rash, fevers, chills, night sweats, weight loss, swollen lymph nodes, body aches, joint swelling, chest pain, shortness of breath, mood changes. POSITIVE muscle aches  Objective  Blood pressure 118/72, pulse 84, height  5\' 3"  (1.6 m), weight 175 lb (79.4 kg), SpO2 99 %.   General: [name] apparent distress alert and oriented x3 mood and affect normal, dressed appropriately.  HEENT: Pupils equal, extraocular movements intact  Respiratory: Patient's speak in full sentences and does not appear short of breath  Cardiovascular: [name] lower extremity edema, non tender, [name] erythema  Neuro: Cranial nerves II through XII are intact, neurovascularly intact in all extremities with 2+ DTRs and 2+ pulses.  Gait normal with good balance and coordination.  MSK:  Non tender with full range of motion and good stability and symmetric strength and tone of shoulders, elbows, wrist, hip, knee and ankles bilaterally.  Back -low back exam [name] significant tightness of the hip flexors bilaterally right greater than left.  Patient has tightness with FABER test as well.  Neck exam also shows significant increase in tightness from previous exam.  Patient negative Spurling's.  Osteopathic findings  C2 flexed rotated and side bent right C6 flexed rotated and side bent left T3 extended rotated and side bent right inhaled rib T9 extended rotated and side bent left L2 flexed rotated and side bent right Sacrum right on right       Assessment and Plan:  Spondylolisthesis at L5-S1 level-I do believe patient is having aggravation.  May need to consider the possibility of prednisone but will try a short course of anti-inflammatories first.  Discussed icing regimen, home exercises.  Zanaflex at night and the gabapentin.  Responded decently to osteopathic manipulation.  Patient will call if [name] significant improvement though.    Nonallopathic problems  Decision today to treat with OMT was based  on Physical Exam  After verbal consent patient was treated with HVLA, ME, FPR techniques in cervical, rib, thoracic, lumbar, and sacral  areas  Patient tolerated the procedure well with improvement in symptoms  Patient given exercises, stretches and  lifestyle modifications  See medications in patient instructions if given  Patient will follow up in 4-8 weeks      The above documentation has been reviewed and is accurate and complete [name], DO       Note: This dictation was prepared with Dragon dictation along with smaller phrase technology. Any transcriptional errors that result from this process are unintentional.

## 2020-05-07 ENCOUNTER — Other Ambulatory Visit: Payer: Self-pay | Admitting: Family Medicine

## 2020-06-06 ENCOUNTER — Other Ambulatory Visit: Payer: Self-pay

## 2020-06-06 ENCOUNTER — Ambulatory Visit: Payer: BC Managed Care – PPO | Admitting: Family Medicine

## 2020-06-06 VITALS — BP 122/82 | HR 102 | Ht 63.0 in | Wt 183.0 lb

## 2020-06-06 DIAGNOSIS — M7602 Gluteal tendinitis, left hip: Secondary | ICD-10-CM

## 2020-06-06 DIAGNOSIS — M4317 Spondylolisthesis, lumbosacral region: Secondary | ICD-10-CM

## 2020-06-06 DIAGNOSIS — M999 Biomechanical lesion, unspecified: Secondary | ICD-10-CM | POA: Diagnosis not present

## 2020-06-06 NOTE — Patient Instructions (Addendum)
See me again in 4 weeks  

## 2020-06-06 NOTE — Progress Notes (Signed)
[place] Carrie [name] Phone: [phone] Subjective:   [name], am serving as a scribe for Dr. Hulan Saas. This visit occurred during the SARS-CoV-2 public health emergency.  Safety protocols were in place, including screening questions prior to the visit, additional usage of staff PPE, and extensive cleaning of exam room while observing appropriate contact time as indicated for disinfecting solutions.   I'm seeing this patient by the request  of:  [name], [name], MD  CC: Back pain follow-up  [name]  Carrie [name] is a 48 y.o. female coming in with complaint of back and neck pain. OMT 05/02/2020. Patient states that she still has some pain more on the left side.  Seems to be in the glued area.  Has been a little bit more active.  Patient denies any numbness or tingling.  Known spinal listhesis but denies no radiation of the pain.  Is taking the gabapentin intermittently.          Reviewed prior external information including notes and imaging from previsou exam, outside providers and external EMR if available.   As well as notes that were available from care everywhere and other healthcare systems.  Past medical history, social, surgical and family history all reviewed in electronic medical record.  No pertanent information unless stated regarding to the chief complaint.   Past Medical History:  Diagnosis Date  . Endometriosis     No Known Allergies   Review of Systems:  No headache, visual changes, nausea, vomiting, diarrhea, constipation, dizziness, abdominal pain, skin rash, fevers, chills, night sweats, weight loss, swollen lymph nodes, body aches, joint swelling, chest pain, shortness of breath, mood changes. POSITIVE muscle aches  Objective  Blood pressure 122/82, pulse (!) 102, height 5\' 3"  (1.6 m), weight 183 lb (83 kg), SpO2 99 %.   General: No apparent distress alert and oriented x3  mood and affect normal, dressed appropriately.  HEENT: Pupils equal, extraocular movements intact  Respiratory: Patient's speak in full sentences and does not appear short of breath  Cardiovascular: No lower extremity edema, non tender, no erythema  Neuro: Cranial nerves II through XII are intact, neurovascularly intact in all extremities with 2+ DTRs and 2+ pulses.  Gait normal with good balance and coordination.  MSK:  Non tender with full range of motion and good stability and symmetric strength and tone of shoulders, elbows, wrist, hip, knee and ankles bilaterally.  Back -low back exam does have some loss of lordosis, tender to palpation in the paraspinal musculature left greater than right.  Seems to be more in the left gluteal area in the left paraspinal of the lumbar spine  Osteopathic findings  C2 flexed rotated and side bent right C6 flexed rotated and side bent left T3 extended rotated and side bent right inhaled rib T9 extended rotated and side bent left L1 flexed rotated and side bent right Sacrum left on left       Assessment and Plan: Gluteal tendonitis of left buttock Patient has done relatively well but is having some mild exacerbation.  Does have a spondylolisthesis is likely contributing to some of the pain as well.  Discussed icing regimen and home exercise, which activities to do which wants to avoid.  Increase activity slowly.  Follow-up again in 4 to 8 weeks  Spondylolisthesis at L5-S1 level Stable.  We will continue to monitor discussed gabapentin.  Mild exacerbation of the chronic problem today.  Nonallopathic problems  Decision today to treat with OMT was based on Physical Exam  After verbal consent patient was treated with HVLA, ME, FPR techniques in cervical, rib, thoracic, lumbar, and sacral  areas  Patient tolerated the procedure well with improvement in symptoms  Patient given exercises, stretches and lifestyle modifications  See medications  in patient instructions if given  Patient will follow up in 4-8 weeks      The above documentation has been reviewed and is accurate and complete [name], DO       Note: This dictation was prepared with Dragon dictation along with smaller phrase technology. Any transcriptional errors that result from this process are unintentional.

## 2020-06-07 ENCOUNTER — Encounter: Payer: Self-pay | Admitting: Family Medicine

## 2020-06-07 NOTE — Assessment & Plan Note (Signed)
Patient has done relatively well but is having some mild exacerbation.  Does have a spondylolisthesis is likely contributing to some of the pain as well.  Discussed icing regimen and home exercise, which activities to do which wants to avoid.  Increase activity slowly.  Follow-up again in 4 to 8 weeks

## 2020-06-07 NOTE — Assessment & Plan Note (Signed)
Stable.  We will continue to monitor discussed gabapentin.  Mild exacerbation of the chronic problem today.

## 2020-07-10 ENCOUNTER — Other Ambulatory Visit: Payer: Self-pay

## 2020-07-10 ENCOUNTER — Ambulatory Visit: Payer: BC Managed Care – PPO | Admitting: Family Medicine

## 2020-07-10 ENCOUNTER — Encounter: Payer: Self-pay | Admitting: Family Medicine

## 2020-07-10 VITALS — BP 112/76 | HR 98 | Ht 63.0 in | Wt 181.0 lb

## 2020-07-10 DIAGNOSIS — M4317 Spondylolisthesis, lumbosacral region: Secondary | ICD-10-CM

## 2020-07-10 DIAGNOSIS — M999 Biomechanical lesion, unspecified: Secondary | ICD-10-CM | POA: Diagnosis not present

## 2020-07-10 NOTE — Progress Notes (Signed)
[place] Phone: [phone] Subjective:   [name], am serving as a scribe for Dr. Hulan Saas. This visit occurred during the SARS-CoV-2 public health emergency.  Safety protocols were in place, including screening questions prior to the visit, additional usage of staff PPE, and extensive cleaning of exam room while observing appropriate contact time as indicated for disinfecting solutions.   I'm seeing this patient by the request  of:  [name], [name], MD  CC: Back pain follow-up  [id number]  Carrie [name] is a 49 y.o. female coming in with complaint of back and neck pain. OMT 06/06/2020. Patient states that she has not had any issues since last visit. Is back at school. Did get a new bed.   Medications patient has been prescribed: Gabapentin, Effexor, Zanaflex  Taking: Yes         Reviewed prior external information including notes and imaging from previsou exam, outside providers and external EMR if available.   As well as notes that were available from care everywhere and other healthcare systems.  Past medical history, social, surgical and family history all reviewed in electronic medical record.  No pertanent information unless stated regarding to the chief complaint.   Past Medical History:  Diagnosis Date  . Endometriosis     No Known Allergies   Review of Systems:  No headache, visual changes, nausea, vomiting, diarrhea, constipation, dizziness, abdominal pain, skin rash, fevers, chills, night sweats, weight loss, swollen lymph nodes, body aches, joint swelling, chest pain, shortness of breath, mood changes. POSITIVE muscle aches  Objective  Blood pressure 112/76, pulse 98, height 5\' 3"  (1.6 m), weight 181 lb (82.1 kg), SpO2 99 %.   General: No apparent distress alert and oriented x3 mood and affect normal, dressed appropriately.  HEENT: Pupils equal, extraocular movements  intact  Respiratory: Patient's speak in full sentences and does not appear short of breath  Cardiovascular: No lower extremity edema, non tender, no erythema  Neuro: Cranial nerves II through XII are intact, neurovascularly intact in all extremities with 2+ DTRs and 2+ pulses.  Gait normal with good balance and coordination.  MSK:  Non tender with full range of motion and good stability and symmetric strength and tone of shoulders, elbows, wrist, hip, knee and ankles bilaterally.  Back -low back exam does show some mild loss of lordosis, some tenderness to palpation of paraspinal musculature lumbar spine right greater than left.  Negative straight leg test.  Tightness to the gluteal regions bilaterally.  Osteopathic findings  C5 flexed rotated and side bent right C6 flexed rotated and side bent left T6 extended rotated and side bent right inhaled rib L1 flexed rotated and side bent right Sacrum right on right       Assessment and Plan:  Spondylolisthesis at L5-S1 level History of spondylolisthesis at L5-S1, no significant radiation at the moment some mild tightness of the gluteal area.  Gabapentin still more of a intermittent basis as well as Celebrex.  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  Follow-up again for 8 weeks    Nonallopathic problems  Decision today to treat with OMT was based on Physical Exam  After verbal consent patient was treated with HVLA, ME, FPR techniques in cervical, rib, thoracic, lumbar, and sacral  areas  Patient tolerated the procedure well with improvement in symptoms  Patient given exercises, stretches and lifestyle modifications  See medications in patient instructions if given  Patient will follow up in 4-8 weeks      The above documentation has been reviewed and is accurate and complete [name], DO       Note: This dictation was prepared with Dragon dictation  along with smaller phrase technology. Any transcriptional errors that result from this process are unintentional.

## 2020-07-10 NOTE — Patient Instructions (Signed)
Like the new bed Lets watch the thumbs In 6-8 weeks lets get labs

## 2020-07-10 NOTE — Assessment & Plan Note (Signed)
History of spondylolisthesis at L5-S1, no significant radiation at the moment some mild tightness of the gluteal area.  Gabapentin still more of a intermittent basis as well as Celebrex.  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  Follow-up again for 8 weeks

## 2020-07-12 ENCOUNTER — Other Ambulatory Visit: Payer: Self-pay | Admitting: Critical Care Medicine

## 2020-07-12 ENCOUNTER — Other Ambulatory Visit: Payer: BC Managed Care – PPO

## 2020-07-12 DIAGNOSIS — Z20822 Contact with and (suspected) exposure to covid-19: Secondary | ICD-10-CM

## 2020-07-15 LAB — NOVEL CORONAVIRUS, NAA: SARS-CoV-2, NAA: NOT DETECTED

## 2020-07-29 ENCOUNTER — Other Ambulatory Visit: Payer: Self-pay | Admitting: Family Medicine

## 2020-08-21 ENCOUNTER — Other Ambulatory Visit: Payer: Self-pay

## 2020-08-21 ENCOUNTER — Ambulatory Visit: Payer: BC Managed Care – PPO | Admitting: Family Medicine

## 2020-08-21 ENCOUNTER — Encounter: Payer: Self-pay | Admitting: Family Medicine

## 2020-08-21 VITALS — BP 100/70 | HR 91 | Ht 63.0 in | Wt 180.0 lb

## 2020-08-21 DIAGNOSIS — M4317 Spondylolisthesis, lumbosacral region: Secondary | ICD-10-CM

## 2020-08-21 DIAGNOSIS — M999 Biomechanical lesion, unspecified: Secondary | ICD-10-CM | POA: Diagnosis not present

## 2020-08-21 NOTE — Patient Instructions (Signed)
Great to see you  5-6 weeks

## 2020-08-21 NOTE — Assessment & Plan Note (Signed)
Chronic problem with mild exacerbation.  Discussed with patient about core strengthening, proper lifting position.  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  Discussed icing regimen and home exercises, which activities to doing which wants to avoid.  Follow-up again in 4 to 8 weeks

## 2020-08-21 NOTE — Progress Notes (Signed)
[name] [place] Phone: [phone] Subjective:   Carrie [name], am serving as a scribe for Dr. Hulan Saas. This visit occurred during the SARS-CoV-2 public health emergency.  Safety protocols were in place, including screening questions prior to the visit, additional usage of staff PPE, and extensive cleaning of exam room while observing appropriate contact time as indicated for disinfecting solutions.    I'm seeing this patient by the request  of:  [name], [name], MD  CC: Low back pain follow-up  [id number]  Carrie [name] is a 49 y.o. female coming in with complaint of back and neck pain. OMT 07/10/2020. Patient states that she can tell it is time for her manipulation.  Medications patient has been prescribed: Gabapentin, Zanaflex, Effexor  Taking: Yes         Reviewed prior external information including notes and imaging from previsou exam, outside providers and external EMR if available.   As well as notes that were available from care everywhere and other healthcare systems.  Past medical history, social, surgical and family history all reviewed in electronic medical record.  No pertanent information unless stated regarding to the chief complaint.   Past Medical History:  Diagnosis Date  . Endometriosis     No Known Allergies   Review of Systems:  No headache, visual changes, nausea, vomiting, diarrhea, constipation, dizziness, abdominal pain, skin rash, fevers, chills, night sweats, weight loss, swollen lymph nodes, body aches, joint swelling, chest pain, shortness of breath, mood changes. POSITIVE muscle aches  Objective  Blood pressure 100/70, pulse 91, height 5\' 3"  (1.6 m), weight 180 lb (81.6 kg), SpO2 97 %.   General: No apparent distress alert and oriented x3 mood and affect normal, dressed appropriately.  HEENT: Pupils equal, extraocular movements intact  Respiratory: Patient's  speak in full sentences and does not appear short of breath  Cardiovascular: No lower extremity edema, non tender, no erythema  Neuro: Cranial nerves II through XII are intact, neurovascularly intact in all extremities with 2+ DTRs and 2+ pulses.  Gait normal with good balance and coordination.  MSK:  Non tender with full range of motion and good stability and symmetric strength and tone of shoulders, elbows, wrist, hip, knee and ankles bilaterally.  Back -low back exam does have some mild loss of lordosis, some tenderness to palpation in the paraspinal musculature lumbar spine right greater than left.  Positive Corky Sox right greater than left as well.  Mild tightness noted over the gluteal tendon on the left side.  Osteopathic findings C6 flexed rotated and side bent left T6 extended rotated and side bent right inhaled rib L1 flexed rotated and side bent left Sacrum left on left       Assessment and Plan:  Spondylolisthesis at L5-S1 level Chronic problem with mild exacerbation.  Discussed with patient about core strengthening, proper lifting position.  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  Discussed icing regimen and home exercises, which activities to doing which wants to avoid.  Follow-up again in 4 to 8 weeks    Nonallopathic problems  Decision today to treat with OMT was based on Physical Exam  After verbal consent patient was treated with HVLA, ME, FPR techniques in cervical, rib, thoracic, lumbar, and sacral  areas  Patient tolerated the procedure well with improvement in symptoms  Patient given exercises, stretches and lifestyle modifications  See medications in patient instructions if given  Patient will follow up in 4-8 weeks      The above documentation has been reviewed and is accurate and complete [name], DO       Note: This dictation was prepared with Dragon dictation along with smaller phrase technology. Any  transcriptional errors that result from this process are unintentional.

## 2020-08-29 ENCOUNTER — Encounter: Payer: Self-pay | Admitting: Internal Medicine

## 2020-09-15 NOTE — Progress Notes (Signed)
[facility] [place] [place] Phone: [phone] Subjective:   Carrie [name], am serving as a scribe for Dr. Hulan Saas. This visit occurred during the SARS-CoV-2 public health emergency.  Safety protocols were in place, including screening questions prior to the visit, additional usage of staff PPE, and extensive cleaning of exam room while observing appropriate contact time as indicated for disinfecting solutions.   I'm seeing this patient by the request  of:  [name], [name], MD  CC: Low back pain follow-up  [id number]  Carrie [name] is a 49 y.o. female coming in with complaint of back and neck pain. OMT 08/21/2020. Patient states that she has had a headache every day since last week. Has had trouble before, fairly bad, like recent headaches though, lot of stress with job   Medications patient has been prescribed: gabapentin and zanaflex   Taking:yes          Reviewed prior external information including notes and imaging from previsou exam, outside providers and external EMR if available.   As well as notes that were available from care everywhere and other healthcare systems.  Past medical history, social, surgical and family history all reviewed in electronic medical record.  [name] pertanent information unless stated regarding to the chief complaint.   Past Medical History:  Diagnosis Date  . Endometriosis     [name] Known Allergies   Review of Systems:  [name] , visual changes, nausea, vomiting, diarrhea, constipation, dizziness, abdominal pain, skin rash, fevers, chills, night sweats, weight loss, swollen lymph nodes, body aches, joint swelling, chest pain, shortness of breath, mood changes. POSITIVE muscle aches, headache   Objective  Blood pressure 110/78, pulse 89, height 5\' 3"  (1.6 m), weight 181 lb (82.1 kg), SpO2 99 %.   General: [name] apparent distress alert and oriented x3 mood and affect normal, dressed  appropriately.  HEENT: Pupils equal, extraocular movements intact  Respiratory: Patient's speak in full sentences and does not appear short of breath  Cardiovascular: [name] lower extremity edema, non tender, [name] erythema  Neuro: Cranial nerves II through XII are intact, neurovascularly intact in all extremities with 2+ DTRs and 2+ pulses.  Gait normal with good balance and coordination.  MSK:  Non tender with full range of motion and good stability and symmetric strength and tone of shoulders, elbows, wrist, hip, knee and ankles bilaterally.  Low back exam does have some mild loss of lordosis  Osteopathic findings  C2 flexed rotated and side bent right C7 flexed rotated and side bent left T3 extended rotated and side bent right inhaled rib T9 extended rotated and side bent left L2 flexed rotated and side bent right Sacrum right on right       Assessment and Plan:  Spondylolisthesis at L5-S1 level Known spondylolisthesis.  Chronic problem with mild exacerbation.  Zanaflex for breakthrough and continuing the gabapentin.  Patient still does not want to try aggressive treatment at this time and doing well with exercises and osteopathic manipulation.  Increase activity slowly.  Follow-up again 6 to 8 weeks  Cervicogenic headache toradol and depo given today, worsening problem of chronic condition. Discussed HEP, RTC in 4-6 weeks    Nonallopathic problems  Decision today to treat with OMT was based on Physical Exam  After verbal consent patient was treated with HVLA, ME, FPR techniques in cervical, rib, thoracic, lumbar, and sacral  areas  Patient tolerated the procedure well with improvement in symptoms  Patient  given exercises, stretches and lifestyle modifications  See medications in patient instructions if given  Patient will follow up in 4-8 weeks      The above documentation has been reviewed and is accurate and complete [name], DO       Note: This dictation  was prepared with Dragon dictation along with smaller phrase technology. Any transcriptional errors that result from this process are unintentional.

## 2020-09-16 ENCOUNTER — Ambulatory Visit: Payer: BC Managed Care – PPO | Admitting: Family Medicine

## 2020-09-16 ENCOUNTER — Encounter: Payer: Self-pay | Admitting: Family Medicine

## 2020-09-16 ENCOUNTER — Other Ambulatory Visit: Payer: Self-pay

## 2020-09-16 VITALS — BP 110/78 | HR 89 | Ht 63.0 in | Wt 181.0 lb

## 2020-09-16 DIAGNOSIS — G4486 Cervicogenic headache: Secondary | ICD-10-CM | POA: Diagnosis not present

## 2020-09-16 DIAGNOSIS — M999 Biomechanical lesion, unspecified: Secondary | ICD-10-CM | POA: Diagnosis not present

## 2020-09-16 DIAGNOSIS — M4317 Spondylolisthesis, lumbosacral region: Secondary | ICD-10-CM

## 2020-09-16 MED ORDER — METHYLPREDNISOLONE ACETATE 40 MG/ML IJ SUSP
40.0000 mg | Freq: Once | INTRAMUSCULAR | Status: AC
  Administered 2020-09-16: 40 mg via INTRAMUSCULAR

## 2020-09-16 MED ORDER — KETOROLAC TROMETHAMINE 30 MG/ML IJ SOLN
30.0000 mg | Freq: Once | INTRAMUSCULAR | Status: AC
  Administered 2020-09-16: 30 mg via INTRAMUSCULAR

## 2020-09-16 NOTE — Assessment & Plan Note (Signed)
toradol and depo given today, worsening problem of chronic condition. Discussed HEP, RTC in 4-6 weeks

## 2020-09-16 NOTE — Patient Instructions (Signed)
Injections today See me again in 4-6 weeks

## 2020-09-16 NOTE — Assessment & Plan Note (Signed)
Known spondylolisthesis.  Chronic problem with mild exacerbation.  Zanaflex for breakthrough and continuing the gabapentin.  Patient still does not want to try aggressive treatment at this time and doing well with exercises and osteopathic manipulation.  Increase activity slowly.  Follow-up again 6 to 8 weeks

## 2020-10-09 ENCOUNTER — Other Ambulatory Visit: Payer: Self-pay

## 2020-10-09 ENCOUNTER — Ambulatory Visit (AMBULATORY_SURGERY_CENTER): Payer: Self-pay | Admitting: *Deleted

## 2020-10-09 VITALS — Ht 63.5 in | Wt 177.0 lb

## 2020-10-09 DIAGNOSIS — Z1211 Encounter for screening for malignant neoplasm of colon: Secondary | ICD-10-CM

## 2020-10-09 NOTE — Progress Notes (Signed)
No egg or soy allergy known to patient  No issues with past sedation with any surgeries or procedures no intubation problems in the past  No FH of Malignant Hyperthermia No diet pills per patient No home 02 use per patient  No blood thinners per patient  Pt denies issues with constipation  No A fib or A flutter  EMMI video to pt or via [place] 19 guidelines implemented in PV today with Pt and RN   Fully vax'd   Due to the COVID-19 pandemic we are asking patients to follow these guidelines. Please only bring one care partner. Please be aware that your care partner may wait in the car in the parking lot or if they feel like they will be too hot to wait in the car, they may wait in the lobby on the 4th floor. All care partners are required to wear a mask the entire time (we do not have any that we can provide them), they need to practice social distancing, and we will do a Covid check for all patient's and care partners when you arrive. Also we will check their temperature and your temperature. If the care partner waits in their car they need to stay in the parking lot the entire time and we will call them on their cell phone when the patient is ready for discharge so they can bring the car to the front of the building. Also all patient's will need to wear a mask into building.

## 2020-10-15 ENCOUNTER — Encounter: Payer: Self-pay | Admitting: Internal Medicine

## 2020-10-22 ENCOUNTER — Other Ambulatory Visit: Payer: Self-pay

## 2020-10-22 ENCOUNTER — Ambulatory Visit (AMBULATORY_SURGERY_CENTER): Payer: BC Managed Care – PPO | Admitting: Internal Medicine

## 2020-10-22 ENCOUNTER — Encounter: Payer: Self-pay | Admitting: Internal Medicine

## 2020-10-22 VITALS — BP 135/75 | HR 60 | Temp 97.9°F | Resp 15 | Ht 63.0 in | Wt 177.0 lb

## 2020-10-22 DIAGNOSIS — Z1211 Encounter for screening for malignant neoplasm of colon: Secondary | ICD-10-CM | POA: Diagnosis present

## 2020-10-22 MED ORDER — SODIUM CHLORIDE 0.9 % IV SOLN: 500.0000 mL | Freq: Once | INTRAVENOUS | Status: DC

## 2020-10-22 NOTE — Op Note (Signed)
[facility] Patient Name: Carrie [name] Procedure Date: 10/22/2020 11:14 AM MRN: [id number] Endoscopist: Gatha Mayer , MD Age: 49 Referring MD:  Date of Birth: Feb 27, 1971 Gender: Female Account #: [id number] Procedure:                Colonoscopy Indications:              Screening for colorectal malignant neoplasm Medicines:                Propofol per Anesthesia, Monitored Anesthesia Care Procedure:                Pre-Anesthesia Assessment:                           - Prior to the procedure, a History and Physical                            was performed, and patient medications and                            allergies were reviewed. The patient's tolerance of                            previous anesthesia was also reviewed. The risks                            and benefits of the procedure and the sedation                            options and risks were discussed with the patient.                            All questions were answered, and informed consent                            was obtained. Prior Anticoagulants: The patient has                            taken no previous anticoagulant or antiplatelet                            agents. ASA Grade Assessment: II - A patient with                            mild systemic disease. After reviewing the risks                            and benefits, the patient was deemed in                            satisfactory condition to undergo the procedure.                           After obtaining informed consent, the colonoscope  was passed under direct vision. Throughout the                            procedure, the patient's blood pressure, pulse, and                            oxygen saturations were monitored continuously. The                            Olympus [id number]-[id number]) Colonoscope was                            introduced through the anus and advanced to the the                             cecum, identified by appendiceal orifice and                            ileocecal valve. The colonoscopy was performed                            without difficulty. The patient tolerated the                            procedure well. The quality of the bowel                            preparation was good. The bowel preparation used                            was Miralax via split dose instruction. The                            ileocecal valve, appendiceal orifice, and rectum                            were photographed. Scope In: 11:31:36 AM Scope Out: 11:49:15 AM Scope Withdrawal Time: 0 hours 13 minutes 4 seconds  Total Procedure Duration: 0 hours 17 minutes 39 seconds  Findings:                 The perianal and digital rectal examinations were                            normal.                           The entire examined colon appeared normal on direct                            and retroflexion views. Complications:            No immediate complications. Estimated Blood Loss:     Estimated blood loss: none. Impression:               - The entire examined colon is normal on direct  and                            retroflexion views.                           - No specimens collected. Recommendation:           - Patient has a contact number available for                            emergencies. The signs and symptoms of potential                            delayed complications were discussed with the                            patient. Return to normal activities tomorrow.                            Written discharge instructions were provided to the                            patient.                           - Resume previous diet.                           - Continue present medications.                           - Repeat colonoscopy in 10 years for screening                            purposes. Gatha Mayer, MD 10/22/2020 12:00:58 PM This report has been signed  electronically.

## 2020-10-22 NOTE — Progress Notes (Signed)
No problems noted in the recovery room. maw 

## 2020-10-22 NOTE — Progress Notes (Signed)
To PACU, VSS. Report to [email or web address] 

## 2020-10-22 NOTE — Patient Instructions (Addendum)
The colonoscopy was normal - no polyps or cancer seen.  I appreciate the opportunity to care for you. Gatha Mayer, MD, [facility]  You may resume your current medications today. Repeat screening colonoscopy in 10 years. Please call if any questions or concerns.     YOU HAD AN ENDOSCOPIC PROCEDURE TODAY AT [facility]:   Refer to the procedure report that was given to you for any specific questions about what was found during the examination.  If the procedure report does not answer your questions, please call your gastroenterologist to clarify.  If you requested that your care partner not be given the details of your procedure findings, then the procedure report has been included in a sealed envelope for you to review at your convenience later.  YOU SHOULD EXPECT: Some feelings of bloating in the abdomen. Passage of more gas than usual.  Walking can help get rid of the air that was put into your GI tract during the procedure and reduce the bloating. If you had a lower endoscopy (such as a colonoscopy or flexible sigmoidoscopy) you may notice spotting of blood in your stool or on the toilet paper. If you underwent a bowel prep for your procedure, you may not have a normal bowel movement for a few days.  Please Note:  You might notice some irritation and congestion in your nose or some drainage.  This is from the oxygen used during your procedure.  There is no need for concern and it should clear up in a day or so.  SYMPTOMS TO REPORT IMMEDIATELY:   Following lower endoscopy (colonoscopy or flexible sigmoidoscopy):  Excessive amounts of blood in the stool  Significant tenderness or worsening of abdominal pains  Swelling of the abdomen that is new, acute  Fever of 100F or higher    For urgent or emergent issues, a gastroenterologist can be reached at any hour by calling [phone]. Do not use MyChart messaging for urgent concerns.    DIET:  We do recommend a small  meal at first, but then you may proceed to your regular diet.  Drink plenty of fluids but you should avoid alcoholic beverages for 24 hours.  ACTIVITY:  You should plan to take it easy for the rest of today and you should NOT DRIVE or use heavy machinery until tomorrow (because of the sedation medicines used during the test).    FOLLOW UP: Our staff will call the number listed on your records 48-72 hours following your procedure to check on you and address any questions or concerns that you may have regarding the information given to you following your procedure. If we do not reach you, we will leave a message.  We will attempt to reach you two times.  During this call, we will ask if you have developed any symptoms of COVID 19. If you develop any symptoms (ie: fever, flu-like symptoms, shortness of breath, cough etc.) before then, please call [phone].  If you test positive for Covid 19 in the 2 weeks post procedure, please call and report this information to [place].    If any biopsies were taken you will be contacted by phone or by letter within the next 1-3 weeks.  Please call us at [phone] if you have not heard about the biopsies in 3 weeks.    SIGNATURES/CONFIDENTIALITY: You and/or your care partner have signed paperwork which will be entered into your electronic medical record.  These signatures attest to the fact  that that the information above on your After Visit Summary has been reviewed and is understood.  Full responsibility of the confidentiality of this discharge information lies with you and/or your care-partner.

## 2020-10-22 NOTE — Progress Notes (Signed)
Pt's states no medical or surgical changes since previsit or office visit. VS by CW. 

## 2020-10-23 NOTE — Progress Notes (Signed)
[facility] [address] [place] Phone: [phone] Subjective:   I Carrie [name] am serving as a Education administrator for Dr. Hulan Saas.  This visit occurred during the SARS-CoV-2 public health emergency.  Safety protocols were in place, including screening questions prior to the visit, additional usage of staff PPE, and extensive cleaning of exam room while observing appropriate contact time as indicated for disinfecting solutions.   I'm seeing this patient by the request  of:  [name], [name], MD  CC: Back and neck pain follow-up  [id number]  Carrie [name] is a 49 y.o. female coming in with complaint of back and neck pain. OMT 09/16/2020. Patient states her hips have been bothering her. Overall doing well.  Still some tightness here and there.  Mild tightness in the gluteal area but nothing severe.  He does feel the gabapentin is helpful.  Medications patient has been prescribed: Effexor, gabapentin, Zanaflex  Taking: Yes         Reviewed prior external information including notes and imaging from previsou exam, outside providers and external EMR if available.   As well as notes that were available from care everywhere and other healthcare systems.  Past medical history, social, surgical and family history all reviewed in electronic medical record.  No pertanent information unless stated regarding to the chief complaint.   Past Medical History:  Diagnosis Date   Allergy    Anemia    past hx low numbers with giving blood    Anxiety    Arthritis    hands    Depression    Endometriosis    Osteoporosis    slight    Thyroid disease     No Known Allergies   Review of Systems:  No headache, visual changes, nausea, vomiting, diarrhea, constipation, dizziness, abdominal pain, skin rash, fevers, chills, night sweats, weight loss, swollen lymph nodes, body aches, joint swelling, chest pain, shortness of breath, mood  changes. POSITIVE muscle aches  Objective  Blood pressure 140/90, pulse 73, height 5\' 3"  (1.6 m), weight 176 lb (79.8 kg), last menstrual period 10/09/2020, SpO2 99 %.   General: No apparent distress alert and oriented x3 mood and affect normal, dressed appropriately.  HEENT: Pupils equal, extraocular movements intact  Respiratory: Patient's speak in full sentences and does not appear short of breath  Cardiovascular: No lower extremity edema, non tender, no erythema  Neuro: Cranial nerves II through XII are intact, neurovascularly intact in all extremities with 2+ DTRs and 2+ pulses.  Gait normal with good balance and coordination.  MSK:  Non tender with full range of motion and good stability and symmetric strength and tone of shoulders, elbows, wrist, hip, knee and ankles bilaterally.  Back -   Osteopathic findings  C6 flexed rotated and side bent left T3 extended rotated and side bent right inhaled rib T9 extended rotated and side bent left L2 flexed rotated and side bent right Sacrum right on right       Assessment and Plan:   Spondylolisthesis at L5-S1 level Chronic problem with mild exacerbation.  Has been doing the gabapentin.  Discussed the Zanaflex as well.  Discussed icing regimen and home exercises.  Patient will continue these different medication.  Patient encouraged to try to do the exercises on a more regular occasion.  Patient may be changing her job setting which I do think may be beneficial for her.  Follow-up with me again in 4 to 8 weeks  Nonallopathic problems  Decision today to treat with OMT was based on Physical Exam  After verbal consent patient was treated with HVLA, ME, FPR techniques in cervical, rib, thoracic, lumbar, and sacral  areas  Patient tolerated the procedure well with improvement in symptoms  Patient given exercises, stretches and lifestyle modifications  See medications in patient instructions if given  Patient will follow up in 4-8  weeks      The above documentation has been reviewed and is accurate and complete [name], DO       Note: This dictation was prepared with Dragon dictation along with smaller phrase technology. Any transcriptional errors that result from this process are unintentional.

## 2020-10-24 ENCOUNTER — Encounter: Payer: Self-pay | Admitting: Family Medicine

## 2020-10-24 ENCOUNTER — Other Ambulatory Visit: Payer: Self-pay

## 2020-10-24 ENCOUNTER — Telehealth: Payer: Self-pay

## 2020-10-24 ENCOUNTER — Ambulatory Visit: Payer: BC Managed Care – PPO | Admitting: Family Medicine

## 2020-10-24 VITALS — BP 140/90 | HR 73 | Ht 63.0 in | Wt 176.0 lb

## 2020-10-24 DIAGNOSIS — M999 Biomechanical lesion, unspecified: Secondary | ICD-10-CM

## 2020-10-24 DIAGNOSIS — M4317 Spondylolisthesis, lumbosacral region: Secondary | ICD-10-CM | POA: Diagnosis not present

## 2020-10-24 NOTE — Telephone Encounter (Signed)
°  Follow up Call-  Call back number 10/22/2020  Post procedure Call Back phone  # [phone]  Permission to leave phone message Yes  Some recent data might be hidden     Patient questions:  Do you have a fever, pain , or abdominal swelling? No. Pain Score  0 *  Have you tolerated food without any problems? Yes.    Have you been able to return to your normal activities? Yes.    Do you have any questions about your discharge instructions: Diet   No. Medications  No. Follow up visit  No.  Do you have questions or concerns about your Care? No.  Actions: * If pain score is 4 or above: No action needed, pain <4. 1. Have you developed a fever since your procedure? no  2.   Have you had an respiratory symptoms (SOB or cough) since your procedure? no  3.   Have you tested positive for COVID 19 since your procedure no  4.   Have you had any family members/close contacts diagnosed with the COVID 19 since your procedure?  no   If yes to any of these questions please route to Joylene John, RN and Joella Prince, RN

## 2020-10-24 NOTE — Assessment & Plan Note (Signed)
Chronic problem with mild exacerbation.  Has been doing the gabapentin.  Discussed the Zanaflex as well.  Discussed icing regimen and home exercises.  Patient will continue these different medication.  Patient encouraged to try to do the exercises on a more regular occasion.  Patient may be changing her job setting which I do think may be beneficial for her.  Follow-up with me again in 4 to 8 weeks

## 2020-10-24 NOTE — Patient Instructions (Addendum)
Good to see you Congrats on the side gig Happy Holidays! Continue everything else See me again in 6-8 weeks

## 2020-10-28 ENCOUNTER — Other Ambulatory Visit: Payer: Self-pay | Admitting: Family Medicine

## 2020-12-04 NOTE — Progress Notes (Signed)
Carrie [name] [place] Phone: [phone] Subjective:   Carrie [name], am serving as a scribe for Dr. Hulan Saas. This visit occurred during the SARS-CoV-2 public health emergency.  Safety protocols were in place, including screening questions prior to the visit, additional usage of staff PPE, and extensive cleaning of exam room while observing appropriate contact time as indicated for disinfecting solutions.   I'm seeing this patient by the request  of:  [name], [name], MD  CC: Back and neck pain follow-up  [id number]  Carrie [name] is a 50 y.o. female coming in with complaint of back and neck pain. OMT 10/24/2020. Patient states that she is having tightness in left scapula.   Medications patient has been prescribed: Gabapentin 300mg  at night, 100mg  morning, Effexor  Taking: Yes         Reviewed prior external information including notes and imaging from previsou exam, outside providers and external EMR if available.   As well as notes that were available from care everywhere and other healthcare systems.  Past medical history, social, surgical and family history all reviewed in electronic medical record.  [name] pertanent information unless stated regarding to the chief complaint.   Past Medical History:  Diagnosis Date  . Allergy   . Anemia    past hx low numbers with giving blood   . Anxiety   . Arthritis    hands   . Depression   . Endometriosis   . Osteoporosis    slight   . Thyroid disease     [name] Known Allergies   Review of Systems:  [name] headache, visual changes, nausea, vomiting, diarrhea, constipation, dizziness, abdominal pain, skin rash, fevers, chills, night sweats, weight loss, swollen lymph nodes, joint swelling, chest pain, shortness of breath, mood changes. POSITIVE muscle aches, body aches  Objective  Blood pressure 124/88, pulse 99, height 5\' 3"  (1.6 m), weight 178 lb (80.7 kg),  SpO2 99 %.   General: [name] apparent distress alert and oriented x3 mood and affect normal, dressed appropriately.  HEENT: Pupils equal, extraocular movements intact  Respiratory: Patient's speak in full sentences and does not appear short of breath  Cardiovascular: [name] lower extremity edema, non tender, [name] erythema  Neuro: Cranial nerves II through XII are intact, neurovascularly intact in all extremities with 2+ DTRs and 2+ pulses.  Gait normal with good balance and coordination.  MSK:  Non tender with full range of motion and good stability and symmetric strength and tone of shoulders, elbows, wrist, hip, knee and ankles bilaterally.  Back -low back exam does have some tightness noted in the paraspinal musculature of the lumbar spine right greater than left.  Patient has some pain in the thoracolumbar junction more than usual.  Patient does have tightness of the lateral aspect of the hips and the gluteal tendon insertion.  Negative straight leg test.  5 out of 5 strength of the lower extremity  Osteopathic findings   C6 flexed rotated and side bent left T3 extended rotated and side bent right inhaled rib T9 extended rotated and side bent left L2 flexed rotated and side bent right Sacrum left on left       Assessment and Plan:  Spondylolisthesis at L5-S1 level Known spondylolisthesis.  Continues to do relatively well.  Patient continues to have some more discomfort and pain.  Patient has had more of the piriformis issues as well and left greater than right.  Nothing [name]  seems to be radiating.  Does have the gabapentin as well as the muscle relaxer.  Follow-up with me again in 6 to 8 weeks    Nonallopathic problems  Decision today to treat with OMT was based on Physical Exam  After verbal consent patient was treated with HVLA, ME, FPR techniques in cervical, rib, thoracic, lumbar, and sacral  areas  Patient tolerated the procedure well with improvement in symptoms  Patient given  exercises, stretches and lifestyle modifications  See medications in patient instructions if given  Patient will follow up in 4-8 weeks      The above documentation has been reviewed and is accurate and complete Carrie Pulley, DO       Note: This dictation was prepared with Dragon dictation along with smaller phrase technology. Any transcriptional errors that result from this process are unintentional.

## 2020-12-05 ENCOUNTER — Ambulatory Visit: Payer: BC Managed Care – PPO | Admitting: Family Medicine

## 2020-12-05 ENCOUNTER — Other Ambulatory Visit: Payer: Self-pay

## 2020-12-05 ENCOUNTER — Encounter: Payer: Self-pay | Admitting: Family Medicine

## 2020-12-05 VITALS — BP 124/88 | HR 99 | Ht 63.0 in | Wt 178.0 lb

## 2020-12-05 DIAGNOSIS — M4317 Spondylolisthesis, lumbosacral region: Secondary | ICD-10-CM | POA: Diagnosis not present

## 2020-12-05 DIAGNOSIS — M999 Biomechanical lesion, unspecified: Secondary | ICD-10-CM | POA: Diagnosis not present

## 2020-12-05 NOTE — Patient Instructions (Signed)
See me again in 6-8 weeks ?

## 2020-12-05 NOTE — Assessment & Plan Note (Signed)
Known spondylolisthesis.  Continues to do relatively well.  Patient continues to have some more discomfort and pain.  Patient has had more of the piriformis issues as well and left greater than right.  Nothing no seems to be radiating.  Does have the gabapentin as well as the muscle relaxer.  Follow-up with me again in 6 to 8 weeks

## 2021-01-16 ENCOUNTER — Ambulatory Visit: Payer: BC Managed Care – PPO | Admitting: Family Medicine

## 2021-01-29 ENCOUNTER — Ambulatory Visit: Payer: BC Managed Care – PPO | Admitting: Family Medicine

## 2021-01-29 ENCOUNTER — Ambulatory Visit (INDEPENDENT_AMBULATORY_CARE_PROVIDER_SITE_OTHER): Payer: BC Managed Care – PPO

## 2021-01-29 ENCOUNTER — Other Ambulatory Visit: Payer: Self-pay

## 2021-01-29 ENCOUNTER — Encounter: Payer: Self-pay | Admitting: Family Medicine

## 2021-01-29 VITALS — BP 132/86 | HR 84 | Ht 63.0 in | Wt 182.0 lb

## 2021-01-29 DIAGNOSIS — M999 Biomechanical lesion, unspecified: Secondary | ICD-10-CM

## 2021-01-29 DIAGNOSIS — M4317 Spondylolisthesis, lumbosacral region: Secondary | ICD-10-CM | POA: Diagnosis not present

## 2021-01-29 DIAGNOSIS — M545 Low back pain, unspecified: Secondary | ICD-10-CM

## 2021-01-29 DIAGNOSIS — M25551 Pain in right hip: Secondary | ICD-10-CM

## 2021-01-29 MED ORDER — METHYLPREDNISOLONE ACETATE 40 MG/ML IJ SUSP
40.0000 mg | Freq: Once | INTRAMUSCULAR | Status: AC
  Administered 2021-01-29: 40 mg via INTRAMUSCULAR

## 2021-01-29 MED ORDER — KETOROLAC TROMETHAMINE 30 MG/ML IJ SOLN
30.0000 mg | Freq: Once | INTRAMUSCULAR | Status: AC
  Administered 2021-01-29: 30 mg via INTRAMUSCULAR

## 2021-01-29 NOTE — Patient Instructions (Addendum)
Lumbar and pelvis xray Cocktail injection See me in 3-4 weeks

## 2021-01-29 NOTE — Progress Notes (Signed)
Carrie [name] [place] Phone: [phone] Subjective:   [name], am serving as a scribe for Dr. Hulan Saas. This visit occurred during the SARS-CoV-2 public health emergency.  Safety protocols were in place, including screening questions prior to the visit, additional usage of staff PPE, and extensive cleaning of exam room while observing appropriate contact time as indicated for disinfecting solutions.   I'm seeing this patient by the request  of:  [name], [name], MD  CC: Back pain follow-up  [id number]  Carrie [name] is a 50 y.o. female coming in with complaint of back and neck pain. OMT 12/05/2020. Patient states 3 weeks ago she fell on slick deck. Landed on R hip/glute and R shoulder. Pain is sharp in hip and dull ache in shoulder. Feels that she exacerbated previous shoulder pain. Having a hard time sleeping due to pain in R glute. No pain during the day. Denies any radiating symptoms. Has tried icing.   Medications patient has been prescribed: Carrie [name]          Reviewed prior external information including notes and imaging from previsou exam, outside providers and external EMR if available.   As well as notes that were available from care everywhere and other healthcare systems.  Past medical history, social, surgical and family history all reviewed in electronic medical record.  No pertanent information unless stated regarding to the chief complaint.   Past Medical History:  Diagnosis Date  . Allergy   . Anemia    past hx low numbers with giving blood   . Anxiety   . Arthritis    hands   . Depression   . Endometriosis   . Osteoporosis    slight   . Thyroid disease     No Known Allergies   Review of Systems:  No headache, visual changes, nausea, vomiting, diarrhea, constipation, dizziness, abdominal pain, skin rash, fevers, chills, night sweats, weight loss, swollen lymph nodes, body aches,  joint swelling, chest pain, shortness of breath, mood changes. POSITIVE muscle aches  Objective  Blood pressure 132/86, pulse 84, height 5\' 3"  (1.6 m), weight 182 lb (82.6 kg), last menstrual period 01/29/2021, SpO2 99 %.   General: No apparent distress alert and oriented x3 mood and affect normal, dressed appropriately.  HEENT: Pupils equal, extraocular movements intact  Respiratory: Patient's speak in full sentences and does not appear short of breath  Cardiovascular: No lower extremity edema, non tender, no erythema  Gait normal with good balance and coordination.  MSK:  Non tender with full range of motion and good stability and symmetric strength and tone of shoulders, elbows, wrist, hip, knee and ankles bilaterally.  Back -no back exam shows continued tenderness noted in the paraspinal musculature of the lumbar spine negative left.  Mild tightness with FABER test which is worse.  Pain also in the gluteal insertion on the lateral aspect of the hip.  Negative straight leg test.  5-5 strength of the lower extremities.  Increasing discomfort in the insertional area on the gluteal area on the right side more than the left  Osteopathic findings  C2 flexed rotated and side bent right C5 flexed rotated and side bent left T6 extended rotated and side bent left with inhaled rib L1 flexed rotated and side bent right Sacrum right on right       Assessment and Plan:  Spondylolisthesis at L5-S1 level Patient has had known spondylolisthesis.  Some mild worsening  pain but seems to be more on the right side.  Has had a left-sided piriformis and this could be more of a right-sided.  Concern for potential lumbar radiculopathy.  Patient does have the gabapentin.  Patient given Toradol and Depo-Medrol today as well discussed with patient about icing regimen.  Patient will follow up with me again in 3 to 4 weeks.  X-rays are pending    Nonallopathic problems  Decision today to treat with OMT was  based on Physical Exam  After verbal consent patient was treated with HVLA, ME, FPR techniques in cervical, rib, thoracic, lumbar, and sacral  areas  Patient tolerated the procedure well with improvement in symptoms  Patient given exercises, stretches and lifestyle modifications  See medications in patient instructions if given  Patient will follow up in 4-8 weeks      The above documentation has been reviewed and is accurate and complete [name], DO       Note: This dictation was prepared with Dragon dictation along with smaller phrase technology. Any transcriptional errors that result from this process are unintentional.

## 2021-01-29 NOTE — Assessment & Plan Note (Signed)
Patient has had known spondylolisthesis.  Some mild worsening pain but seems to be more on the right side.  Has had a left-sided piriformis and this could be more of a right-sided.  Concern for potential lumbar radiculopathy.  Patient does have the gabapentin.  Patient given Toradol and Depo-Medrol today as well discussed with patient about icing regimen.  Patient will follow up with me again in 3 to 4 weeks.  X-rays are pending

## 2021-02-03 ENCOUNTER — Other Ambulatory Visit: Payer: Self-pay | Admitting: Family Medicine

## 2021-02-10 ENCOUNTER — Other Ambulatory Visit: Payer: Self-pay | Admitting: Family Medicine

## 2021-02-23 NOTE — Progress Notes (Signed)
Carrie [name] Phone: [phone] Subjective:   Carrie [name], am serving as a scribe for Dr. Hulan Saas. This visit occurred during the SARS-CoV-2 public health emergency.  Safety protocols were in place, including screening questions prior to the visit, additional usage of staff PPE, and extensive cleaning of exam room while observing appropriate contact time as indicated for disinfecting solutions.   I'm seeing this patient by the request  of:  [name], [name], MD  CC: back pain follow up   [id number]  Carrie [name] is a 50 y.o. female coming in with complaint of back and neck pain. OMT 01/29/2021. Patient is having increase in R hip pain. Patient fell off of a ladder last week. Pain in R SI and glute. Patient feels like her gait has been affected due to pain. Has not been using mm relaxer. Using Effexor and gabapentin. Using 6 Advil for pain relief.   Medications patient has been prescribed: Effexor, Gabapentin  Taking: Yes         Reviewed prior external information including notes and imaging from previsou exam, outside providers and external EMR if available.   As well as notes that were available from care everywhere and other healthcare systems.  Past medical history, social, surgical and family history all reviewed in electronic medical record.  [name] pertanent information unless stated regarding to the chief complaint.   Past Medical History:  Diagnosis Date  . Allergy   . Anemia    past hx low numbers with giving blood   . Anxiety   . Arthritis    hands   . Depression   . Endometriosis   . Osteoporosis    slight   . Thyroid disease     [name] Known Allergies   Review of Systems:  [name] headache, visual changes, nausea, vomiting, diarrhea, constipation, dizziness, abdominal pain, skin rash, fevers, chills, night sweats, weight loss, swollen lymph nodes,  joint swelling, chest pain,  shortness of breath, mood changes. POSITIVE muscle aches, body aches  Objective  Blood pressure 130/86, pulse 92, height 5\' 3"  (1.6 m), weight 181 lb (82.1 kg), last menstrual period 01/29/2021, SpO2 98 %.   General: [name] apparent distress alert and oriented x3 mood and affect normal, dressed appropriately.  HEENT: Pupils equal, extraocular movements intact  Respiratory: Patient's speak in full sentences and does not appear short of breath  Cardiovascular: [name] lower extremity edema, non tender, [name] erythema  Gait normal with good balance and coordination.  MSK:  Non tender with full range of motion and good stability and symmetric strength and tone of shoulders, elbows, wrist, hip, knee and ankles bilaterally.  Back -low back exam does have more tightness noted in the gluteal area on the right side.  Patient also has pain noted in the right paraspinal musculature of the lumbar spine nearly throughout the entire lumbar spine.  [name] spinous process tenderness.  Tightness with Corky Sox but negative straight leg test.  Patient does have worsening pain though with extension of the back as well.  Osteopathic findings  C5 flexed rotated and side bent right T3 extended rotated and side bent right inhaled rib T9 extended rotated and side bent left L2 flexed rotated and side bent right Sacrum right on right       Assessment and Plan:  Spondylolisthesis at L5-S1 level Chronic problem with exacerbation.  Toradol and Depo-Medrol given again today secondary to the exacerbation that has occurred  in patient.  We discussed with patient about the gabapentin and increasing short-term to 400 mg at night.  Patient also given meloxicam and encouraged her to discontinue all other anti-inflammatories with her not starting this until tomorrow.  Warned of potential side effects.  Patient will watch for any type of stomach discomfort, changes in bowel as well and seek medical attention if anything occurs.  Think this will  be highly unlikely.  Follow-up with me again 4 to 6 weeks    Nonallopathic problems  Decision today to treat with OMT was based on Physical Exam  After verbal consent patient was treated with HVLA, ME, FPR techniques in cervical, rib, thoracic, lumbar, and sacral  areas  Patient tolerated the procedure well with improvement in symptoms  Patient given exercises, stretches and lifestyle modifications  See medications in patient instructions if given  Patient will follow up in 4-8 weeks      The above documentation has been reviewed and is accurate and complete [name], DO       Note: This dictation was prepared with Dragon dictation along with smaller phrase technology. Any transcriptional errors that result from this process are unintentional.

## 2021-02-24 ENCOUNTER — Encounter: Payer: Self-pay | Admitting: Family Medicine

## 2021-02-24 ENCOUNTER — Other Ambulatory Visit: Payer: Self-pay

## 2021-02-24 ENCOUNTER — Ambulatory Visit: Payer: BC Managed Care – PPO | Admitting: Family Medicine

## 2021-02-24 VITALS — BP 130/86 | HR 92 | Ht 63.0 in | Wt 181.0 lb

## 2021-02-24 DIAGNOSIS — M9901 Segmental and somatic dysfunction of cervical region: Secondary | ICD-10-CM | POA: Diagnosis not present

## 2021-02-24 DIAGNOSIS — M2550 Pain in unspecified joint: Secondary | ICD-10-CM

## 2021-02-24 DIAGNOSIS — M9902 Segmental and somatic dysfunction of thoracic region: Secondary | ICD-10-CM

## 2021-02-24 DIAGNOSIS — M4317 Spondylolisthesis, lumbosacral region: Secondary | ICD-10-CM | POA: Diagnosis not present

## 2021-02-24 DIAGNOSIS — M9904 Segmental and somatic dysfunction of sacral region: Secondary | ICD-10-CM

## 2021-02-24 DIAGNOSIS — M9903 Segmental and somatic dysfunction of lumbar region: Secondary | ICD-10-CM

## 2021-02-24 DIAGNOSIS — M9908 Segmental and somatic dysfunction of rib cage: Secondary | ICD-10-CM

## 2021-02-24 MED ORDER — KETOROLAC TROMETHAMINE 30 MG/ML IJ SOLN
30.0000 mg | Freq: Once | INTRAMUSCULAR | Status: AC
  Administered 2021-02-24: 30 mg via INTRAMUSCULAR

## 2021-02-24 MED ORDER — MELOXICAM 15 MG PO TABS: 15.0000 mg | ORAL_TABLET | Freq: Every day | ORAL | 0 refills | Status: AC

## 2021-02-24 MED ORDER — METHYLPREDNISOLONE ACETATE 40 MG/ML IJ SUSP
40.0000 mg | Freq: Once | INTRAMUSCULAR | Status: AC
  Administered 2021-02-24: 40 mg via INTRAMUSCULAR

## 2021-02-24 NOTE — Assessment & Plan Note (Signed)
Chronic problem with exacerbation.  Toradol and Depo-Medrol given again today secondary to the exacerbation that has occurred in patient.  We discussed with patient about the gabapentin and increasing short-term to 400 mg at night.  Patient also given meloxicam and encouraged her to discontinue all other anti-inflammatories with her not starting this until tomorrow.  Warned of potential side effects.  Patient will watch for any type of stomach discomfort, changes in bowel as well and seek medical attention if anything occurs.  Think this will be highly unlikely.  Follow-up with me again 4 to 6 weeks

## 2021-02-24 NOTE — Patient Instructions (Addendum)
Injections in backside Meloxicam 15mg  daily for 10 days then as needed Do not use NSAIDS such as Advil or Aleve when taking Meloxicam It is ok to use Tylenol for additional pain relief Start Melxociam tomorrow Gabapentin 400mg  at night Continue 100mg of gabapentin during the day See me in 5-6 weeks  Stay vertical

## 2021-03-13 ENCOUNTER — Other Ambulatory Visit: Payer: Self-pay

## 2021-03-13 ENCOUNTER — Ambulatory Visit: Payer: BC Managed Care – PPO | Admitting: Family Medicine

## 2021-03-13 ENCOUNTER — Ambulatory Visit (INDEPENDENT_AMBULATORY_CARE_PROVIDER_SITE_OTHER): Payer: BC Managed Care – PPO

## 2021-03-13 VITALS — BP 112/78 | HR 89 | Ht 63.0 in | Wt 180.8 lb

## 2021-03-13 DIAGNOSIS — M79671 Pain in right foot: Secondary | ICD-10-CM

## 2021-03-13 DIAGNOSIS — M722 Plantar fascial fibromatosis: Secondary | ICD-10-CM | POA: Diagnosis not present

## 2021-03-13 NOTE — Progress Notes (Signed)
   I, Peterson Lombard, LAT, ATC acting as a scribe for Lynne Leader, MD.  Carrie [name] is a 50 y.o. female who presents to [place] at [facility] today for R foot pain x2+ weeks w/ no known MOI. Pt was previously seen by Dr. Tamala Julian on 02/24/21 for OMT of her back and neck. Today, pt locates pain to plantar aspect of heel and tightness in arch. Pt works as an Building control surveyor.  She has a history of contralateral heel contusion/Planter fasciitis that took several months to get better.  R foot swelling: no Aggravates: after walking/being on her feet all day, sometimes in the morning, not wearing shoes Treatments tried: brace, ice, meloxicam,  Pertinent review of systems: No fevers or chills  Relevant historical information: Spondylolisthesis lumbar spine   Exam:  BP 112/78 (BP Location: Right Arm, Patient Position: Sitting, Cuff Size: Normal)   Pulse 89   Ht 5\' 3"  (1.6 m)   Wt 180 lb 12.8 oz (82 kg)   LMP 03/13/2021   SpO2 97%   BMI 32.03 kg/m  General: Well Developed, well nourished, and in no acute distress.   MSK: Right foot normal-appearing Normal motion. Tender palpation plantar medial calcaneus. Normal foot and ankle strength    Lab and Radiology Results  X-ray images right os calcis obtained today personally and independently interpreted. No acute fractures visible. Await formal radiology review   Assessment and Plan: 50 y.o. female with right heel pain.  Patient did fall off a ladder a 3-4 weeks ago but she does not recall injuring her heel during the fall.  Initially her heel only started hurting about 2 weeks ago.  I am not sure if the fall is directly related to the heel pain or not.  Regardless pain is consistent with heel contusion and Planter fasciitis.  Treat as though Planter fasciitis with heel cushioning bracing eccentric exercises arch straps and night splint.  Recheck in 6 weeks with myself or Dr. Tamala Julian.  If not improved may consider  steroid injection but would like to avoid that if possible.   PDMP not reviewed this encounter. Orders Placed This Encounter  Procedures  . DG Os Calcis Right    Standing Status:   Future    Number of Occurrences:   1    Standing Expiration Date:   03/13/2022    Order Specific Question:   Reason for Exam (SYMPTOM  OR DIAGNOSIS REQUIRED)    Answer:   right heel pain    Order Specific Question:   Is patient pregnant?    Answer:   No    Order Specific Question:   Preferred imaging location?    Answer:   [facility]   No orders of the defined types were placed in this encounter.    Discussed warning signs or symptoms. Please see discharge instructions. Patient expresses understanding.   The above documentation has been reviewed and is accurate and complete Lynne Leader, M.D.

## 2021-03-13 NOTE — Patient Instructions (Addendum)
Thank you for coming in today.  Do the exercise we talked about. View at my-exercise-[email or web address] using code: [id number]  Consider physical therapy if needed.   Night splint Ice massage Gel heel cups Arch Straps  Please use Voltaren gel (Generic Diclofenac Gel) up to 4x daily for pain as needed.  This is available over-the-counter as both the name brand Voltaren gel and the generic diclofenac gel.  Recheck in 6 weeks especially if not better.   If needed you can use a cam walker boot temporarily from [employer].  Do not drive with the boot on right foot.

## 2021-03-17 NOTE — Progress Notes (Signed)
Right heel looks normal to radiology

## 2021-04-01 NOTE — Progress Notes (Signed)
[facility] [address] [place] Phone: [phone] Subjective:   I Carrie [name] am serving as a Education administrator for Dr. Hulan Saas.  This visit occurred during the SARS-CoV-2 public health emergency.  Safety protocols were in place, including screening questions prior to the visit, additional usage of staff PPE, and extensive cleaning of exam room while observing appropriate contact time as indicated for disinfecting solutions.   I'm seeing this patient by the request  of:  [name], [name], MD  CC:  Back and neck pain follow up   [id number]  Carrie [name] is a 50 y.o. female coming in with complaint of back and neck pain. OMT 02/24/2021. Did see Dr. Georgina Snell for R heel pain 2 weeks ago. Patient states the heel is a little better and believes she is making progress. Been off of her feet recently. Back and neck feels about the same.  Patient states that the heel pain is feeling approximately 50% better.  Not as severe as what it was previously.  Been doing the exercises fairly regularly.  Medications patient has been prescribed: effexor and gabapentin   Taking: Yes         Reviewed prior external information including notes and imaging from previsou exam, outside providers and external EMR if available.   As well as notes that were available from care everywhere and other healthcare systems.  Past medical history, social, surgical and family history all reviewed in electronic medical record.  No pertanent information unless stated regarding to the chief complaint.   Past Medical History:  Diagnosis Date  . Allergy   . Anemia    past hx low numbers with giving blood   . Anxiety   . Arthritis    hands   . Depression   . Endometriosis   . Osteoporosis    slight   . Thyroid disease     No Known Allergies   Review of Systems:  No  visual changes, nausea, vomiting, diarrhea, constipation, dizziness, abdominal pain, skin rash,  fevers, chills, night sweats, weight loss, swollen lymph nodes,  joint swelling, chest pain, shortness of breath, mood changes. POSITIVE muscle aches, headache, body aches  Objective  Blood pressure (!) 150/90, pulse 91, height 5\' 3"  (1.6 m), weight 181 lb (82.1 kg), last menstrual period 03/13/2021, SpO2 97 %.   General: No apparent distress alert and oriented x3 mood and affect normal, dressed appropriately.  HEENT: Pupils equal, extraocular movements intact  Respiratory: Patient's speak in full sentences and does not appear short of breath  Cardiovascular: No lower extremity edema, non tender, no erythema  Gait normal with good balance and coordination.  MSK: Right heel exam shows the patient is tender to palpation in the area.  Patient does have some pain over the cuboid bone as well.  Pain seems to be on the medial aspect of the calcaneal area.  Very mild breakdown of the longitudinal arch noted and breakdown of the transverse arch of the foot noted.  Neurovascularly intact distally. Back -low back exam still has some tightness noted of the piriformis muscle on the left side.  Does have a positive Corky Sox.  Tightness noted in the paraspinal musculature around L5-S1 area.  Negative straight leg test.  Lacks last 5 degrees of extension of the back Tightness noted in the parascapular region right greater than left.  Neck exam does have mild tightness with sidebending but otherwise fairly unremarkable.  Osteopathic findings  C2 flexed  rotated and side bent right C6 flexed rotated and side bent left T3 extended rotated and side bent right inhaled rib L4 flexed rotated and side bent right Sacrum left on left  Limited musculoskeletal ultrasound was performed and interpreted by [name]  Limited ultrasound of patient's right foot shows the patient does have some mild hypoechoic changes around the plantar fascia.  Seems to be a potential acute finding.  No acute tear though appreciated of the  plantar fascia. Impression: Planter fasciitis we will     Assessment and Plan:  Piriformis syndrome, left Stable.  We will continue to monitor.  He does have some back pain and is responding well to osteopathic manipulation.  Patient is having no radicular symptoms other than what she has had previously and does have the gabapentin.  Continue the same dose as well as the Effexor.  Follow-up with me again 6 weeks  Plantar fasciitis of right foot Patient does have some mild plantar fasciitis noted.  Is making some progress with the conservative therapy.  Discussed posture and ergonomics, proper shoes, over-the-counter orthotics, avoiding being barefoot.  Worsening pain will consider the possibility of injection and follow-up.  Follow-up with me again 6 weeks    Nonallopathic problems  Decision today to treat with OMT was based on Physical Exam  After verbal consent patient was treated with HVLA, ME, FPR techniques in cervical, rib, thoracic, lumbar, and sacral  areas  Patient tolerated the procedure well with improvement in symptoms  Patient given exercises, stretches and lifestyle modifications  See medications in patient instructions if given  Patient will follow up in 6 weeks      The above documentation has been reviewed and is accurate and complete [name], DO       Note: This dictation was prepared with Dragon dictation along with smaller phrase technology. Any transcriptional errors that result from this process are unintentional.

## 2021-04-02 ENCOUNTER — Encounter: Payer: Self-pay | Admitting: Family Medicine

## 2021-04-02 ENCOUNTER — Ambulatory Visit: Payer: Self-pay

## 2021-04-02 ENCOUNTER — Other Ambulatory Visit: Payer: Self-pay

## 2021-04-02 ENCOUNTER — Ambulatory Visit: Payer: BC Managed Care – PPO | Admitting: Family Medicine

## 2021-04-02 VITALS — BP 150/90 | HR 91 | Ht 63.0 in | Wt 181.0 lb

## 2021-04-02 DIAGNOSIS — M722 Plantar fascial fibromatosis: Secondary | ICD-10-CM

## 2021-04-02 DIAGNOSIS — G5702 Lesion of sciatic nerve, left lower limb: Secondary | ICD-10-CM | POA: Diagnosis not present

## 2021-04-02 DIAGNOSIS — M9901 Segmental and somatic dysfunction of cervical region: Secondary | ICD-10-CM | POA: Diagnosis not present

## 2021-04-02 DIAGNOSIS — M9904 Segmental and somatic dysfunction of sacral region: Secondary | ICD-10-CM

## 2021-04-02 DIAGNOSIS — M9903 Segmental and somatic dysfunction of lumbar region: Secondary | ICD-10-CM

## 2021-04-02 DIAGNOSIS — M79671 Pain in right foot: Secondary | ICD-10-CM | POA: Diagnosis not present

## 2021-04-02 DIAGNOSIS — G8929 Other chronic pain: Secondary | ICD-10-CM | POA: Diagnosis not present

## 2021-04-02 DIAGNOSIS — M9908 Segmental and somatic dysfunction of rib cage: Secondary | ICD-10-CM | POA: Diagnosis not present

## 2021-04-02 DIAGNOSIS — M9902 Segmental and somatic dysfunction of thoracic region: Secondary | ICD-10-CM

## 2021-04-02 NOTE — Patient Instructions (Signed)
Good to see you 2 more days Continue exercises from Dr. Georgina Snell Avoid being barefoot Continue to ice  See me again in 6 weeks if no better we will inject the plantar fascia

## 2021-04-02 NOTE — Assessment & Plan Note (Signed)
Patient does have some mild plantar fasciitis noted.  Is making some progress with the conservative therapy.  Discussed posture and ergonomics, proper shoes, over-the-counter orthotics, avoiding being barefoot.  Worsening pain will consider the possibility of injection and follow-up.  Follow-up with me again 6 weeks

## 2021-04-02 NOTE — Assessment & Plan Note (Signed)
Stable.  We will continue to monitor.  He does have some back pain and is responding well to osteopathic manipulation.  Patient is having no radicular symptoms other than what she has had previously and does have the gabapentin.  Continue the same dose as well as the Effexor.  Follow-up with me again 6 weeks

## 2021-04-28 ENCOUNTER — Other Ambulatory Visit: Payer: Self-pay | Admitting: Family Medicine

## 2021-04-28 NOTE — Telephone Encounter (Signed)
Sent patient My Chart message about current dosage.

## 2021-05-21 NOTE — Progress Notes (Signed)
[facility] [address] [place] Phone: [phone] Subjective:   I [name] am serving as a Education administrator for Dr. Hulan Saas.  This visit occurred during the SARS-CoV-2 public health emergency.  Safety protocols were in place, including screening questions prior to the visit, additional usage of staff PPE, and extensive cleaning of exam room while observing appropriate contact time as indicated for disinfecting solutions.   I'm seeing this patient by the request  of:  [name], [name], MD  CC: Neck and back pain follow-up  [id number]  MYKAELA ARENA is a 50 y.o. female coming in with complaint of back and neck pain. OMT 04/02/2021. Also L piriformis and R plantar fascia pain. Patient states her foot still hurts pretty bad some days. Neck and back is ok. Hip has been bothering her more with laying down.   Medications patient has been prescribed:  Gabapentin, Meloxicam, Effexor Taking: Yes         Reviewed prior external information including notes and imaging from previsou exam, outside providers and external EMR if available.   As well as notes that were available from care everywhere and other healthcare systems.  Past medical history, social, surgical and family history all reviewed in electronic medical record.  No pertanent information unless stated regarding to the chief complaint.   Past Medical History:  Diagnosis Date   Allergy    Anemia    past hx low numbers with giving blood    Anxiety    Arthritis    hands    Depression    Endometriosis    Osteoporosis    slight    Thyroid disease     No Known Allergies   Review of Systems:  No headache, visual changes, nausea, vomiting, diarrhea, constipation, dizziness, abdominal pain, skin rash, fevers, chills, night sweats, weight loss, swollen lymph nodes, body aches, joint swelling, chest pain, shortness of breath, mood changes. POSITIVE muscle aches  Objective   Blood pressure 128/90, pulse 74, height 5\' 3"  (1.6 m), weight 184 lb (83.5 kg), SpO2 99 %.   General: No apparent distress alert and oriented x3 mood and affect normal, dressed appropriately.  HEENT: Pupils equal, extraocular movements intact  Respiratory: Patient's speak in full sentences and does not appear short of breath  Cardiovascular: No lower extremity edema, non tender, no erythema  Tenderness overall.  Patient does have some tightness noted in the paraspinal musculature of the lumbar spine.  Seems to be worse at the sacroiliac joints left greater than right. Foot exam does show that patient does have some breakdown of the longitudinal arch noted.  Tenderness to palpation over the medial calcaneal area on the right heel.  No masses appreciated.  Osteopathic findings  C6 flexed rotated and side bent left T3 extended rotated and side bent right inhaled rib T9 extended rotated and side bent left L2 flexed rotated and side bent right Sacrum left on left       Assessment and Plan:  Spondylolisthesis at L5-S1 level Chronic problem.  Patient has changed jobs recently.  Patient is making progress but still having some discomfort and pain.  Discussed with patient about the possibility of other injections if necessary otherwise I do think patient should do relatively well.  Increase activity slowly.  Discussed icing regimen.  Follow-up again in 6 to 8 weeks.  Plantar fasciitis of right foot Patient does have fairly severe plantar fasciitis.  Discussed different over-the-counter shoes that I  think will be effective.  Encouraged her to do the exercises on a regular basis which patient does state that she has been fairly noncompliant.  Patient will increase activity slowly.  Follow-up with me again in 6 weeks.  If continuing to have difficulty will consider injection at follow-up.   Nonallopathic problems  Decision today to treat with OMT was based on Physical Exam  After verbal  consent patient was treated with HVLA, ME, FPR techniques in cervical, rib, thoracic, lumbar, and sacral  areas  Patient tolerated the procedure well with improvement in symptoms  Patient given exercises, stretches and lifestyle modifications  See medications in patient instructions if given  Patient will follow up in 4-8 weeks      The above documentation has been reviewed and is accurate and complete [name], DO        Note: This dictation was prepared with Dragon dictation along with smaller phrase technology. Any transcriptional errors that result from this process are unintentional.

## 2021-05-22 ENCOUNTER — Other Ambulatory Visit: Payer: Self-pay

## 2021-05-22 ENCOUNTER — Encounter: Payer: Self-pay | Admitting: Family Medicine

## 2021-05-22 ENCOUNTER — Ambulatory Visit: Payer: BC Managed Care – PPO | Admitting: Family Medicine

## 2021-05-22 VITALS — BP 128/90 | HR 74 | Ht 63.0 in | Wt 184.0 lb

## 2021-05-22 DIAGNOSIS — M722 Plantar fascial fibromatosis: Secondary | ICD-10-CM

## 2021-05-22 DIAGNOSIS — M4317 Spondylolisthesis, lumbosacral region: Secondary | ICD-10-CM

## 2021-05-22 DIAGNOSIS — M9903 Segmental and somatic dysfunction of lumbar region: Secondary | ICD-10-CM

## 2021-05-22 DIAGNOSIS — M9908 Segmental and somatic dysfunction of rib cage: Secondary | ICD-10-CM | POA: Diagnosis not present

## 2021-05-22 DIAGNOSIS — M9902 Segmental and somatic dysfunction of thoracic region: Secondary | ICD-10-CM | POA: Diagnosis not present

## 2021-05-22 DIAGNOSIS — M9901 Segmental and somatic dysfunction of cervical region: Secondary | ICD-10-CM | POA: Diagnosis not present

## 2021-05-22 DIAGNOSIS — M9904 Segmental and somatic dysfunction of sacral region: Secondary | ICD-10-CM | POA: Diagnosis not present

## 2021-05-22 NOTE — Assessment & Plan Note (Signed)
Patient does have fairly severe plantar fasciitis.  Discussed different over-the-counter shoes that I think will be effective.  Encouraged her to do the exercises on a regular basis which patient does state that she has been fairly noncompliant.  Patient will increase activity slowly.  Follow-up with me again in 6 weeks.  If continuing to have difficulty will consider injection at follow-up.

## 2021-05-22 NOTE — Assessment & Plan Note (Signed)
Chronic problem.  Patient has changed jobs recently.  Patient is making progress but still having some discomfort and pain.  Discussed with patient about the possibility of other injections if necessary otherwise I do think patient should do relatively well.  Increase activity slowly.  Discussed icing regimen.  Follow-up again in 6 to 8 weeks.

## 2021-05-22 NOTE — Patient Instructions (Signed)
Good to see you Oofos or hoka recovery sandals try REI See me again in 4-6 weeks if no better injections

## 2021-06-12 DIAGNOSIS — Z01419 Encounter for gynecological examination (general) (routine) without abnormal findings: Secondary | ICD-10-CM | POA: Diagnosis not present

## 2021-06-12 DIAGNOSIS — Z6832 Body mass index (BMI) 32.0-32.9, adult: Secondary | ICD-10-CM | POA: Diagnosis not present

## 2021-06-12 DIAGNOSIS — Z13820 Encounter for screening for osteoporosis: Secondary | ICD-10-CM | POA: Diagnosis not present

## 2021-06-18 NOTE — Progress Notes (Signed)
[name] [place] Phone: [phone] Subjective:   Carrie [name], am serving as a scribe for Dr. Hulan Saas.  I'm seeing this patient by the request  of:  [name], [name], MD  CC: Low back and neck pain  [id number]  Carrie [name] is a 50 y.o. female coming in with complaint of back and neck pain. OMT 05/22/2021. Patient states back is ready for OMT and Right foot is better but still having some pain. Patient wears the oofos sandals everyday since her last appointment.  Patient states that the heel is doing much better overall.  Medications patient has been prescribed: Gabapentin; meloxicam, tizanadine   Taking: gabapentin           Past Medical History:  Diagnosis Date   Allergy    Anemia    past hx low numbers with giving blood    Anxiety    Arthritis    hands    Depression    Endometriosis    Osteoporosis    slight    Thyroid disease     No Known Allergies   Review of Systems:  No headache, visual changes, nausea, vomiting, diarrhea, constipation, dizziness, abdominal pain, skin rash, fevers, chills, night sweats, weight loss, swollen lymph nodes, body aches, joint swelling, chest pain, shortness of breath, mood changes. POSITIVE muscle aches  Objective  Blood pressure 130/82, pulse 84, height '5\' 3"'$  (1.6 m), weight 187 lb (84.8 kg), SpO2 98 %.   General: No apparent distress alert and oriented x3 mood and affect normal, dressed appropriately.  HEENT: Pupils equal, extraocular movements intact  Respiratory: Patient's speak in full sentences and does not appear short of breath  Cardiovascular: No lower extremity edema, non tender, no erythema  Neuro: Cranial nerves II through XII are intact, neurovascularly intact in all extremities with 2+ DTRs and 2+ pulses.  Gait normal with good balance and coordination.  MSK:  Non tender with full range of motion and good stability and symmetric  strength and tone of shoulders, elbows, wrist, hip, knee and ankles bilaterally.  Back -low back exam still has tightness noted.  Patient does have worsening pain with extension of the back.  Patient does have tightness with FABER test bilaterally and does have tightness in the hamstring with straight leg test but no true radicular symptoms at the moment.  Osteopathic findings  C2 flexed rotated and side bent right C6 flexed rotated and side bent left T3 extended rotated and side bent right inhaled rib T9 extended rotated and side bent left L2 flexed rotated and side bent right Sacrum right on right       Assessment and Plan:  Spondylolisthesis at L5-S1 level Chronic problem with mild exacerbation.  Patient has been doing a new job where patient is doing more walking and lifting.  Discussed with patient about posture and ergonomics, and the importance of weight loss.  Refilled patient's gabapentin.  He does have Zanaflex for breakthrough.  Continuing the Effexor as well.  Patient will follow up with me again in 6 to 8 weeks patient would like to avoid all of the muscle relaxer because she does feel like it makes her mean  Plantar fasciitis of right foot Heel pain is improving.  Very slowly.  Sometimes feels the new shoes and exercises have been helpful.  We will continue to monitor   Nonallopathic problems  Decision today to treat with OMT was based on Physical  Exam  After verbal consent patient was treated with HVLA, ME, FPR techniques in cervical, rib, thoracic, lumbar, and sacral  areas  Patient tolerated the procedure well with improvement in symptoms  Patient given exercises, stretches and lifestyle modifications  See medications in patient instructions if given  Patient will follow up in 4-8 weeks      The above documentation has been reviewed and is accurate and complete [name], DO       Note: This dictation was prepared with Dragon dictation along with  smaller phrase technology. Any transcriptional errors that result from this process are unintentional.

## 2021-06-19 ENCOUNTER — Ambulatory Visit: Payer: BC Managed Care – PPO | Admitting: Family Medicine

## 2021-06-19 ENCOUNTER — Other Ambulatory Visit: Payer: Self-pay

## 2021-06-19 VITALS — BP 130/82 | HR 84 | Ht 63.0 in | Wt 187.0 lb

## 2021-06-19 DIAGNOSIS — M9901 Segmental and somatic dysfunction of cervical region: Secondary | ICD-10-CM

## 2021-06-19 DIAGNOSIS — M9908 Segmental and somatic dysfunction of rib cage: Secondary | ICD-10-CM | POA: Diagnosis not present

## 2021-06-19 DIAGNOSIS — M722 Plantar fascial fibromatosis: Secondary | ICD-10-CM | POA: Diagnosis not present

## 2021-06-19 DIAGNOSIS — M9903 Segmental and somatic dysfunction of lumbar region: Secondary | ICD-10-CM | POA: Diagnosis not present

## 2021-06-19 DIAGNOSIS — M9902 Segmental and somatic dysfunction of thoracic region: Secondary | ICD-10-CM | POA: Diagnosis not present

## 2021-06-19 DIAGNOSIS — M9904 Segmental and somatic dysfunction of sacral region: Secondary | ICD-10-CM

## 2021-06-19 DIAGNOSIS — M4317 Spondylolisthesis, lumbosacral region: Secondary | ICD-10-CM

## 2021-06-19 NOTE — Assessment & Plan Note (Signed)
Heel pain is improving.  Very slowly.  Sometimes feels the new shoes and exercises have been helpful.  We will continue to monitor

## 2021-06-19 NOTE — Assessment & Plan Note (Signed)
Chronic problem with mild exacerbation.  Patient has been doing a new job where patient is doing more walking and lifting.  Discussed with patient about posture and ergonomics, and the importance of weight loss.  Refilled patient's gabapentin.  He does have Zanaflex for breakthrough.  Continuing the Effexor as well.  Patient will follow up with me again in 6 to 8 weeks

## 2021-06-19 NOTE — Patient Instructions (Addendum)
See me in 6 weeks

## 2021-07-10 DIAGNOSIS — N951 Menopausal and female climacteric states: Secondary | ICD-10-CM | POA: Diagnosis not present

## 2021-07-10 DIAGNOSIS — Z13220 Encounter for screening for lipoid disorders: Secondary | ICD-10-CM | POA: Diagnosis not present

## 2021-07-10 DIAGNOSIS — Z1321 Encounter for screening for nutritional disorder: Secondary | ICD-10-CM | POA: Diagnosis not present

## 2021-07-10 DIAGNOSIS — Z1329 Encounter for screening for other suspected endocrine disorder: Secondary | ICD-10-CM | POA: Diagnosis not present

## 2021-07-10 DIAGNOSIS — Z13228 Encounter for screening for other metabolic disorders: Secondary | ICD-10-CM | POA: Diagnosis not present

## 2021-07-30 NOTE — Progress Notes (Signed)
[place] Phone: [phone] Subjective:   [name], am serving as a scribe for Dr. Hulan Saas.  This visit occurred during the SARS-CoV-2 public health emergency.  Safety protocols were in place, including screening questions prior to the visit, additional usage of staff PPE, and extensive cleaning of exam room while observing appropriate contact time as indicated for disinfecting solutions.   I'm seeing this patient by the request  of:  [name], [name], MD  CC: Back and neck pain follow-up  [id number]  YATZARY MERRIWEATHER is a 50 y.o. female coming in with complaint of back and neck pain. OMT 06/19/2021. Also f/u for plantar fasciitis R foot. 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 Foot pain has improved. Wearing OOFOS daily.   Patient is having L knee pain over patella and the medial aspect after hitting it in doorframe trying to keep dog in the house.  Patient states that the pain is on the medial aspect of the knee.  Seems to be stable.  Not though improving now.  Been going on for over a week now  Medications patient has been prescribed: Gabapentin  Taking:         Reviewed prior external information including notes and imaging from previsou exam, outside providers and external EMR if available.   As well as notes that were available from care everywhere and other healthcare systems.  Past medical history, social, surgical and family history all reviewed in electronic medical record.  No pertanent information unless stated regarding to the chief complaint.   Past Medical History:  Diagnosis Date   Allergy    Anemia    past hx low numbers with giving blood    Anxiety    Arthritis    hands    Depression    Endometriosis    Osteoporosis    slight    Thyroid disease     No Known Allergies   Review of Systems:  No headache, visual changes, nausea,  vomiting, diarrhea, constipation, dizziness, abdominal pain, skin rash, fevers, chills, night sweats, weight loss, swollen lymph nodes, body aches, joint swelling, chest pain, shortness of breath, mood changes. POSITIVE muscle aches  Objective  Blood pressure 138/88, pulse 85, height 5\' 3"  (1.6 m), weight 185 lb (83.9 kg), SpO2 97 %.   General: No apparent distress alert and oriented x3 mood and affect normal, dressed appropriately.  HEENT: Pupils equal, extraocular movements intact  Respiratory: Patient's speak in full sentences and does not appear short of breath  Mild antalgic gait noted.  Patient's left knee tender to palpation over the medial joint line and does have some mild laxity of the MCL compared to the contralateral side.  Still has an endpoint noted.  Testing of the MCL is nontender.  Neck exam still has significant tightness noted.  No radicular symptoms noted.  Patient does have tightness in the parascapular region as usual.  Mild tightness of the low back also noted especially with some extension of the back.  Limited muscular skeletal ultrasound was performed and interpreted by Hulan Saas, M  Limited ultrasound shows the patient does have hypoechoic changes noted within the MCL.  No significant  Osteopathic findings  C2 flexed rotated and side bent right C6 flexed rotated and side bent left T3 extended rotated and side bent right inhaled rib T8 extended rotated and side bent left L2 flexed  rotated and side bent right Sacrum right on right       Assessment and Plan:  MCL sprain of right knee Patient does have a strain of the MCL noted.  Discussed icing regimen and home exercises, discussed topical anti-inflammatories.  Discussed that bracing at the moment.  Worsening pain imaging may be necessary but I think that over 4 to 6 weeks patient should improve significantly.  Spondylolisthesis at L5-S1 level Chronic problem with exacerbation.  Likely contributing to  some of the discomfort and pain as well.  Discussed icing regimen and home exercises.  Increase activity slowly.  Continue gabapentin.  Follow-up with me again in 6 weeks   Nonallopathic problems  Decision today to treat with OMT was based on Physical Exam  After verbal consent patient was treated with HVLA, ME, FPR techniques in cervical, rib, thoracic, lumbar, and sacral  areas  Patient tolerated the procedure well with improvement in symptoms  Patient given exercises, stretches and lifestyle modifications  See medications in patient instructions if given  Patient will follow up in 4-8 weeks     The above documentation has been reviewed and is accurate and complete [name], DO        Note: This dictation was prepared with Dragon dictation along with smaller phrase technology. Any transcriptional errors that result from this process are unintentional.

## 2021-07-31 ENCOUNTER — Encounter: Payer: Self-pay | Admitting: Family Medicine

## 2021-07-31 ENCOUNTER — Ambulatory Visit: Payer: BC Managed Care – PPO | Admitting: Family Medicine

## 2021-07-31 ENCOUNTER — Other Ambulatory Visit: Payer: Self-pay

## 2021-07-31 ENCOUNTER — Ambulatory Visit: Payer: Self-pay

## 2021-07-31 VITALS — BP 138/88 | HR 85 | Ht 63.0 in | Wt 185.0 lb

## 2021-07-31 DIAGNOSIS — M4317 Spondylolisthesis, lumbosacral region: Secondary | ICD-10-CM | POA: Diagnosis not present

## 2021-07-31 DIAGNOSIS — M9903 Segmental and somatic dysfunction of lumbar region: Secondary | ICD-10-CM | POA: Diagnosis not present

## 2021-07-31 DIAGNOSIS — M9901 Segmental and somatic dysfunction of cervical region: Secondary | ICD-10-CM

## 2021-07-31 DIAGNOSIS — M79671 Pain in right foot: Secondary | ICD-10-CM

## 2021-07-31 DIAGNOSIS — M9904 Segmental and somatic dysfunction of sacral region: Secondary | ICD-10-CM

## 2021-07-31 DIAGNOSIS — M9902 Segmental and somatic dysfunction of thoracic region: Secondary | ICD-10-CM

## 2021-07-31 DIAGNOSIS — M9908 Segmental and somatic dysfunction of rib cage: Secondary | ICD-10-CM

## 2021-07-31 DIAGNOSIS — S83411A Sprain of medial collateral ligament of right knee, initial encounter: Secondary | ICD-10-CM | POA: Diagnosis not present

## 2021-07-31 NOTE — Patient Instructions (Signed)
Do prescribed exercises at least 3x a week Avoid quick lateral motion if possible Wear brace for 2 weeks, then with activity and long walking See you again in 5 weeks

## 2021-07-31 NOTE — Assessment & Plan Note (Signed)
Chronic problem with exacerbation.  Likely contributing to some of the discomfort and pain as well.  Discussed icing regimen and home exercises.  Increase activity slowly.  Continue gabapentin.  Follow-up with me again in 6 weeks

## 2021-07-31 NOTE — Assessment & Plan Note (Signed)
Patient does have a strain of the MCL noted.  Discussed icing regimen and home exercises, discussed topical anti-inflammatories.  Discussed that bracing at the moment.  Worsening pain imaging may be necessary but I think that over 4 to 6 weeks patient should improve significantly.

## 2021-08-04 ENCOUNTER — Other Ambulatory Visit: Payer: Self-pay | Admitting: Family Medicine

## 2021-08-07 DIAGNOSIS — M79641 Pain in right hand: Secondary | ICD-10-CM | POA: Diagnosis not present

## 2021-08-07 DIAGNOSIS — M79642 Pain in left hand: Secondary | ICD-10-CM | POA: Diagnosis not present

## 2021-09-01 ENCOUNTER — Other Ambulatory Visit: Payer: Self-pay

## 2021-09-01 ENCOUNTER — Ambulatory Visit: Payer: BC Managed Care – PPO | Admitting: Sports Medicine

## 2021-09-01 VITALS — BP 122/74 | HR 94 | Ht 63.0 in | Wt 182.0 lb

## 2021-09-01 DIAGNOSIS — M9903 Segmental and somatic dysfunction of lumbar region: Secondary | ICD-10-CM

## 2021-09-01 DIAGNOSIS — M9901 Segmental and somatic dysfunction of cervical region: Secondary | ICD-10-CM

## 2021-09-01 DIAGNOSIS — M9902 Segmental and somatic dysfunction of thoracic region: Secondary | ICD-10-CM | POA: Diagnosis not present

## 2021-09-01 DIAGNOSIS — M9908 Segmental and somatic dysfunction of rib cage: Secondary | ICD-10-CM

## 2021-09-01 DIAGNOSIS — M546 Pain in thoracic spine: Secondary | ICD-10-CM | POA: Diagnosis not present

## 2021-09-01 DIAGNOSIS — M9905 Segmental and somatic dysfunction of pelvic region: Secondary | ICD-10-CM

## 2021-09-01 DIAGNOSIS — G8929 Other chronic pain: Secondary | ICD-10-CM

## 2021-09-01 NOTE — Progress Notes (Signed)
Carrie [name] D.[facility] [place] Phone: [phone]   Assessment and Plan:     1. Chronic bilateral thoracic back pain 2. Somatic dysfunction of cervical region 3. Somatic dysfunction of thoracic region 4. Somatic dysfunction of lumbar region 5. Somatic dysfunction of pelvic region 6. Somatic dysfunction of rib region -Chronic with exacerbation, subsequent visit - Recurrence of chronic musculoskeletal complaints most prominent in lower thoracic, mid thoracic, lower cervical spine - Patient elected for repeat OMT.  Tolerated well per note below  Decision today to treat with OMT was based on Physical Exam   After verbal consent patient was treated with HVLA (high velocity low amplitude), ME (muscle energy), FPR (flex positional release), ST (soft tissue), PC/PD (Pelvic Compression/ Pelvic Decompression) techniques in cervical, rib, thoracic, lumbar, and pelvic areas. Patient tolerated the procedure well with improvement in symptoms.  Patient educated on potential side effects of soreness and recommended to rest, hydrate, and use Tylenol as needed for pain control.    Pertinent previous records reviewed include previous office notes   Follow Up: In 4 to 6 weeks for repeat OMT   Subjective:    Chief Complaint: Back pain, Right foot pain, Right knee pain  HPI:  07/31/2021 Chronic problem with exacerbation.  Likely contributing to some of the discomfort and pain as well.  Discussed icing regimen and home exercises.  Increase activity slowly.  Continue gabapentin.  Follow-up with me again in 6 weeks Patient does have a strain of the MCL noted.  Discussed icing regimen and home exercises, discussed topical anti-inflammatories.  Discussed that bracing at the moment.  Worsening pain imaging may be necessary but I think that over 4 to 6 weeks patient should improve significantly.  09/01/21 Pain is getting progressively  better. Knee and foot are getting better. Knee does pop and is still tender to palpation. No new complaints.  Presenting for repeat OMT with chronic recurrence of lumbar, thoracic, cervical spine pains.  Denies numbness/tingling/weakness in extremities.  Relevant Historical Information: Hypothyroidism  Additional pertinent review of systems negative.   Current Outpatient Medications:    ALPRAZolam (XANAX) 0.25 MG tablet, Take 0.25 mg by mouth 3 (three) times daily as needed for anxiety., Disp: , Rfl:    Calcium Citrate-Vitamin D (CALCIUM + D PO), Take by mouth 2 (two) times daily. Calcium 1,000 mg and Vit D 3 100 IU, Disp: , Rfl:    gabapentin (NEURONTIN) 100 MG capsule, TAKE 2 CAPSULES BY MOUTH 2 TIMES A DAY, Disp: 180 capsule, Rfl: 1   gabapentin (NEURONTIN) 300 MG capsule, TAKE 1 CAPSULE BY MOUTH NIGHTLY, Disp: 90 capsule, Rfl: 1   levothyroxine (SYNTHROID) 100 MCG tablet, Take 100 mcg by mouth daily., Disp: , Rfl:    loratadine (CLARITIN) 10 MG tablet, Take 10 mg by mouth daily., Disp: , Rfl:    Magnesium ([employer] TRIPLE MAGNESIUM COMPLEX) 400 MG CAPS, Take by mouth., Disp: , Rfl:    Melatonin 10 MG CAPS, Take 10 mg by mouth., Disp: , Rfl:    meloxicam (MOBIC) 15 MG tablet, Take 1 tablet (15 mg total) by mouth daily. (Patient not taking: Reported on 06/19/2021), Disp: 30 tablet, Rfl: 0   Multiple Vitamins-Minerals (CENTRUM PO), Take by mouth. Centrum Mini's Multivitamin, Disp: , Rfl:    Norethin Ace-Eth Estrad-FE (GILDESS FE 1/20 PO), Take 1 tablet by mouth daily. (Patient not taking: Reported on 06/19/2021), Disp: , Rfl:    OVER THE COUNTER MEDICATION, CBD  power to sleep 2 pills at bedtime, Disp: , Rfl:    OVER THE COUNTER MEDICATION, Natrol Complete balance Menopause relief BID- am and Pm, Disp: , Rfl:    tiZANidine (ZANAFLEX) 4 MG tablet, Take 1 tablet (4 mg total) by mouth every 6 (six) hours as needed for muscle spasms. (Patient not taking: Reported on 06/19/2021), Disp: 30 tablet, Rfl: 1    venlafaxine XR (EFFEXOR-XR) 75 MG 24 hr capsule, TAKE 1 CAPSULE BY MOUTH DAILY WITH BREAKFAST., Disp: 90 capsule, Rfl: 1   Objective:     Vitals:   09/01/21 1035  BP: 122/74  Pulse: 94  SpO2: 97%  Weight: 182 lb (82.6 kg)  Height: 5\' 3"  (1.6 m)      Body mass index is 32.24 kg/m.    Physical Exam:    General: Well-appearing, cooperative, sitting comfortably in no acute distress.   OMT Physical Exam:  ASIS Compression Test: Positive Right Cervical: TTP paraspinal, C3 RRSL Rib: Bilateral elevated first rib with TTP Thoracic: TTP paraspinal, T10-L2 RRSL Lumbar: TTP paraspinal, T10-L2 RRSL Pelvis: Right anterior innominate    Electronically signed by:  Carrie [name] D.[facility] 11:47 AM 09/01/21

## 2021-09-01 NOTE — Patient Instructions (Signed)
OMT in 4-6 weeks

## 2021-09-05 ENCOUNTER — Ambulatory Visit: Payer: BC Managed Care – PPO | Admitting: Family Medicine

## 2021-09-08 DIAGNOSIS — M79642 Pain in left hand: Secondary | ICD-10-CM | POA: Diagnosis not present

## 2021-09-08 DIAGNOSIS — M79641 Pain in right hand: Secondary | ICD-10-CM | POA: Diagnosis not present

## 2021-09-29 DIAGNOSIS — E0590 Thyrotoxicosis, unspecified without thyrotoxic crisis or storm: Secondary | ICD-10-CM | POA: Diagnosis not present

## 2021-10-16 NOTE — Progress Notes (Signed)
[place] [facility] [place] Phone: [phone] Subjective:   [name], am serving as a scribe for Dr. Hulan Saas. This visit occurred during the SARS-CoV-2 public health emergency.  Safety protocols were in place, including screening questions prior to the visit, additional usage of staff PPE, and extensive cleaning of exam room while observing appropriate contact time as indicated for disinfecting solutions.  I'm seeing this patient by the request  of:  [name], [name], MD  CC: Headache, neck pain, back pain  [id number]  DHRITHI Carrie [name] is a 50 y.o. female coming in with complaint of back and neck pain. OMT 07/31/2021. Patient states that she was sick this past week. L knee pain is better when using brace. Had clicking this past week but brace is at her friend's house.   Has a headache and feels that she needs adjustment.   Medications patient has been prescribed: Effexor, Gabapentin  Taking:         Reviewed prior external information including notes and imaging from previsou exam, outside providers and external EMR if available.   As well as notes that were available from care everywhere and other healthcare systems.  Past medical history, social, surgical and family history all reviewed in electronic medical record.  No pertanent information unless stated regarding to the chief complaint.   Past Medical History:  Diagnosis Date   Allergy    Anemia    past hx low numbers with giving blood    Anxiety    Arthritis    hands    Depression    Endometriosis    Osteoporosis    slight    Thyroid disease     No Known Allergies   Review of Systems:  No headache, visual changes, nausea, vomiting, diarrhea, constipation, dizziness, abdominal pain, skin rash, fevers, chills, night sweats, weight loss, swollen lymph nodes, body aches, joint swelling, chest pain, shortness of breath, mood changes. POSITIVE  muscle aches  Objective  Blood pressure 122/88, pulse 94, height 5\' 3"  (1.6 m), weight 187 lb (84.8 kg), SpO2 99 %.   General: No apparent distress alert and oriented x3 mood and affect normal, dressed appropriately.  HEENT: Pupils equal, extraocular movements intact  Respiratory: Patient's speak in full sentences and does not appear short of breath patient does have a cough Cardiovascular: No lower extremity edema, non tender, no erythema  Neck exam does have loss of lordosis.  Tenderness to palpation more on the left side of the neck and the left scapular region.  Significant tightness noted noted still in the right glutes in the right sacroiliac joint.   Osteopathic findings  C2 flexed rotated and side bent right C6 flexed rotated and side bent left T3 extended rotated and side bent right inhaled rib T9 extended rotated and side bent left L2 flexed rotated and side bent right Sacrum right on right       Assessment and Plan:  Spondylolisthesis at L5-S1 level History of spondylolisthesis.  Discussed icing regimen and home exercises, discussed which activities to do which wants to avoid.  Increase activity slowly.  Do believe that with patient being As active recently secondary to her illness likely this is contributing to some of the discomfort and pain as well.  Follow-up again in 4 to 6 weeks  Cervicogenic headache Significant increase in cervicogenic headaches as well.  Some of this could be secondary to the URI patient is fighting off.  Overall  we will monitor her headaches and if any worsening she knows to seek medical attention.  Follow-up again in 4 to 6 weeks   Nonallopathic problems  Decision today to treat with OMT was based on Physical Exam  After verbal consent patient was treated with HVLA, ME, FPR techniques in cervical, rib, thoracic, lumbar, and sacral  areas  Patient tolerated the procedure well with improvement in symptoms  Patient given exercises,  stretches and lifestyle modifications  See medications in patient instructions if given  Patient will follow up in 4-6 weeks      The above documentation has been reviewed and is accurate and complete [name], DO       Note: This dictation was prepared with Dragon dictation along with smaller phrase technology. Any transcriptional errors that result from this process are unintentional.

## 2021-10-17 ENCOUNTER — Other Ambulatory Visit: Payer: Self-pay

## 2021-10-17 ENCOUNTER — Ambulatory Visit: Payer: BC Managed Care – PPO | Admitting: Family Medicine

## 2021-10-17 VITALS — BP 122/88 | HR 94 | Ht 63.0 in | Wt 187.0 lb

## 2021-10-17 DIAGNOSIS — M9901 Segmental and somatic dysfunction of cervical region: Secondary | ICD-10-CM

## 2021-10-17 DIAGNOSIS — M9908 Segmental and somatic dysfunction of rib cage: Secondary | ICD-10-CM

## 2021-10-17 DIAGNOSIS — M9903 Segmental and somatic dysfunction of lumbar region: Secondary | ICD-10-CM | POA: Diagnosis not present

## 2021-10-17 DIAGNOSIS — M4317 Spondylolisthesis, lumbosacral region: Secondary | ICD-10-CM | POA: Diagnosis not present

## 2021-10-17 DIAGNOSIS — M9902 Segmental and somatic dysfunction of thoracic region: Secondary | ICD-10-CM | POA: Diagnosis not present

## 2021-10-17 DIAGNOSIS — M9904 Segmental and somatic dysfunction of sacral region: Secondary | ICD-10-CM

## 2021-10-17 DIAGNOSIS — G4486 Cervicogenic headache: Secondary | ICD-10-CM

## 2021-10-17 NOTE — Assessment & Plan Note (Signed)
Significant increase in cervicogenic headaches as well.  Some of this could be secondary to the URI patient is fighting off.  Overall we will monitor her headaches and if any worsening she knows to seek medical attention.  Follow-up again in 4 to 6 weeks

## 2021-10-17 NOTE — Assessment & Plan Note (Signed)
History of spondylolisthesis.  Discussed icing regimen and home exercises, discussed which activities to do which wants to avoid.  Increase activity slowly.  Do believe that with patient being As active recently secondary to her illness likely this is contributing to some of the discomfort and pain as well.  Follow-up again in 4 to 6 weeks

## 2021-10-17 NOTE — Patient Instructions (Signed)
Great to see you  Feel better  4/6 week follow up

## 2021-10-27 ENCOUNTER — Other Ambulatory Visit: Payer: Self-pay | Admitting: Family Medicine

## 2021-11-04 DIAGNOSIS — M79641 Pain in right hand: Secondary | ICD-10-CM | POA: Diagnosis not present

## 2021-11-04 DIAGNOSIS — M79642 Pain in left hand: Secondary | ICD-10-CM | POA: Diagnosis not present

## 2021-11-13 NOTE — Progress Notes (Signed)
Carrie [name] Phone: [phone] Subjective:   Carrie [name], am serving as a scribe for Dr. Hulan Saas.This visit occurred during the SARS-CoV-2 public health emergency.  Safety protocols were in place, including screening questions prior to the visit, additional usage of staff PPE, and extensive cleaning of exam room while observing appropriate contact time as indicated for disinfecting solutions.  I'm seeing this patient by the request  of:  [name], [name], MD  CC: Neck and back pain follow-up  [id number]  Carrie [name] is a 51 y.o. female coming in with complaint of back and neck pain. OMT 10/17/2021. Patient states that she continues to have L knee pain surrounding patella. Knee braces helps with pain. Back and neck have not been bothering her.   Medications patient has been prescribed: Gabapentin, Effexor  Taking: Yes         Reviewed prior external information including notes and imaging from previsou exam, outside providers and external EMR if available.   As well as notes that were available from care everywhere and other healthcare systems.  Past medical history, social, surgical and family history all reviewed in electronic medical record.  [name] pertanent information unless stated regarding to the chief complaint.   Past Medical History:  Diagnosis Date   Allergy    Anemia    past hx low numbers with giving blood    Anxiety    Arthritis    hands    Depression    Endometriosis    Osteoporosis    slight    Thyroid disease     [name] Known Allergies   Review of Systems:  [name] headache, visual changes, nausea, vomiting, diarrhea, constipation, dizziness, abdominal pain, skin rash, fevers, chills, night sweats, weight loss, swollen lymph nodes, body aches, joint swelling, chest pain, shortness of breath, mood changes. POSITIVE muscle aches  Objective  Blood pressure 110/68, pulse 90,  height 5\' 3"  (1.6 m), weight 184 lb (83.5 kg), last menstrual period 03/13/2021, SpO2 99 %.   General: [name] apparent distress alert and oriented x3 mood and affect normal, dressed appropriately.  HEENT: Pupils equal, extraocular movements intact  Respiratory: Patient's speak in full sentences and does not appear short of breath  Cardiovascular: [name] lower extremity edema, non tender, [name] erythema  Neck exam does have some mild loss lordosis.  Some tenderness to palpation in the paraspinal musculature. Low back exam does have some worsening pain with extension.  Negative straight leg test. Left knee exam does still have tenderness to palpation over the medial joint space.  [name] significant instability.  Crepitus noted of the knee though with range of motion.  Patient does have effusion noted of the patellofemoral joint.  After informed written and verbal consent, patient was seated on exam table. Left knee was prepped with alcohol swab and utilizing anterolateral approach, patient's left knee space was injected with 4:1  marcaine 0.5%: Kenalog 40mg /dL. Patient tolerated the procedure well without immediate complications.  Osteopathic findings  C2 flexed rotated and side bent right C6 flexed rotated and side bent left T3 extended rotated and side bent right inhaled rib T9 extended rotated and side bent left L2 flexed rotated and side bent right Sacrum right on right       Assessment and Plan:  Effusion of left knee joint Patient does have still effusion of the left knee.  Patient did get hit by a dog initially back in September where  patient did complain about this initially.  Patient continues to have some possible mild instability noted of the knee she states.  If she puts any pressure on it increasing discomfort.  We will get x-ray to further evaluate.  Patient denies any more locking or giving out on her and they do need to consider the possibility of further imaging.  Follow-up again in 4 to  6 weeks  Cervicogenic headache Chronic, with exacerbation.  Patient does have meloxicam for breakthrough.  Continuing on the Effexor.  Is on Zanaflex as well that can sometimes help for any type of flares.  Encourage patient to continue to stay active.  Discussed icing regimen and home exercises.  Discussed continuing to monitor her posture and ergonomics and lifting mechanics.  Follow-up again in 6 to 8 weeks  Spondylolisthesis at L5-S1 level Chronic problem with exacerbation as well.  Likely compensating for some of the knee at the moment.  Discussed which activities to do which wants to avoid.  Increase activity.  Follow-up with me again in 6 to 8 weeks.   Nonallopathic problems  Decision today to treat with OMT was based on Physical Exam  After verbal consent patient was treated with HVLA, ME, FPR techniques in cervical, rib, thoracic, lumbar, and sacral  areas  Patient tolerated the procedure well with improvement in symptoms  Patient given exercises, stretches and lifestyle modifications  See medications in patient instructions if given  Patient will follow up in 4-8 weeks      The above documentation has been reviewed and is accurate and complete Carrie Pulley, DO        Note: This dictation was prepared with Dragon dictation along with smaller phrase technology. Any transcriptional errors that result from this process are unintentional.

## 2021-11-14 ENCOUNTER — Ambulatory Visit (INDEPENDENT_AMBULATORY_CARE_PROVIDER_SITE_OTHER): Payer: BC Managed Care – PPO

## 2021-11-14 ENCOUNTER — Other Ambulatory Visit: Payer: Self-pay

## 2021-11-14 ENCOUNTER — Encounter: Payer: Self-pay | Admitting: Family Medicine

## 2021-11-14 ENCOUNTER — Ambulatory Visit (INDEPENDENT_AMBULATORY_CARE_PROVIDER_SITE_OTHER): Payer: BC Managed Care – PPO | Admitting: Family Medicine

## 2021-11-14 VITALS — BP 110/68 | HR 90 | Ht 63.0 in | Wt 184.0 lb

## 2021-11-14 DIAGNOSIS — M4317 Spondylolisthesis, lumbosacral region: Secondary | ICD-10-CM

## 2021-11-14 DIAGNOSIS — G4486 Cervicogenic headache: Secondary | ICD-10-CM

## 2021-11-14 DIAGNOSIS — M9903 Segmental and somatic dysfunction of lumbar region: Secondary | ICD-10-CM | POA: Diagnosis not present

## 2021-11-14 DIAGNOSIS — M9904 Segmental and somatic dysfunction of sacral region: Secondary | ICD-10-CM

## 2021-11-14 DIAGNOSIS — G8929 Other chronic pain: Secondary | ICD-10-CM | POA: Diagnosis not present

## 2021-11-14 DIAGNOSIS — M9908 Segmental and somatic dysfunction of rib cage: Secondary | ICD-10-CM | POA: Diagnosis not present

## 2021-11-14 DIAGNOSIS — M9901 Segmental and somatic dysfunction of cervical region: Secondary | ICD-10-CM

## 2021-11-14 DIAGNOSIS — M25462 Effusion, left knee: Secondary | ICD-10-CM

## 2021-11-14 DIAGNOSIS — M25562 Pain in left knee: Secondary | ICD-10-CM | POA: Diagnosis not present

## 2021-11-14 DIAGNOSIS — M9902 Segmental and somatic dysfunction of thoracic region: Secondary | ICD-10-CM

## 2021-11-14 NOTE — Assessment & Plan Note (Signed)
Chronic, with exacerbation.  Patient does have meloxicam for breakthrough.  Continuing on the Effexor.  Is on Zanaflex as well that can sometimes help for any type of flares.  Encourage patient to continue to stay active.  Discussed icing regimen and home exercises.  Discussed continuing to monitor her posture and ergonomics and lifting mechanics.  Follow-up again in 6 to 8 weeks

## 2021-11-14 NOTE — Assessment & Plan Note (Signed)
Patient does have still effusion of the left knee.  Patient did get hit by a dog initially back in September where patient did complain about this initially.  Patient continues to have some possible mild instability noted of the knee she states.  If she puts any pressure on it increasing discomfort.  We will get x-ray to further evaluate.  Patient denies any more locking or giving out on her and they do need to consider the possibility of further imaging.  Follow-up again in 4 to 6 weeks

## 2021-11-14 NOTE — Patient Instructions (Addendum)
Xray today Injected knee today See me again in 4-6 weeks

## 2021-11-14 NOTE — Assessment & Plan Note (Signed)
Chronic problem with exacerbation as well.  Likely compensating for some of the knee at the moment.  Discussed which activities to do which wants to avoid.  Increase activity.  Follow-up with me again in 6 to 8 weeks.

## 2021-12-11 NOTE — Progress Notes (Signed)
[place] Phone: [phone] Subjective:   [name], am serving as a scribe for Dr. Hulan Saas.  This visit occurred during the SARS-CoV-2 public health emergency.  Safety protocols were in place, including screening questions prior to the visit, additional usage of staff PPE, and extensive cleaning of exam room while observing appropriate contact time as indicated for disinfecting solutions.    I'm seeing this patient by the request  of:  [name], [name], MD  CC: Left knee pain, back pain  [id number]  Carrie [name] is a 51 y.o. female coming in with complaint of back and neck pain. OMT 11/14/2021. Also f/u for L knee pain. Patient states that her back is doing well.   Injection in knee lasted 2 weeks. Pain has not improved. By end of the da her pain is worse over medial aspect. Swells near end of day and causes stiffness in joint. Today pain is not as bad as it had been.   Medications patient has been prescribed: Gabapentin, Effexor  Taking:         Reviewed prior external information including notes and imaging from previsou exam, outside providers and external EMR if available.   As well as notes that were available from care everywhere and other healthcare systems.  Past medical history, social, surgical and family history all reviewed in electronic medical record.  No pertanent information unless stated regarding to the chief complaint.   Past Medical History:  Diagnosis Date   Allergy    Anemia    past hx low numbers with giving blood    Anxiety    Arthritis    hands    Depression    Endometriosis    Osteoporosis    slight    Thyroid disease     No Known Allergies   Review of Systems:  No headache, visual changes, nausea, vomiting, diarrhea, constipation, dizziness, abdominal pain, skin rash, fevers, chills, night sweats, weight loss, swollen lymph nodes, body aches,  joint swelling, chest pain, shortness of breath, mood changes. POSITIVE muscle aches  Objective  Blood pressure 118/82, pulse 100, height 5\' 3"  (1.6 m), weight 167 lb (75.8 kg), last menstrual period 03/13/2021, SpO2 99 %.   General: No apparent distress alert and oriented x3 mood and affect normal, dressed appropriately.  HEENT: Pupils equal, extraocular movements intact  Respiratory: Patient's speak in full sentences and does not appear short of breath  Cardiovascular: No lower extremity edema, non tender, no erythema  Patient does have tenderness to palpation over the left knee.  No effusion noted.  Patient has limited range of motion with a positive McMurray's.  Patient lacks last 10 degrees of extension in the last 15 degrees of flexion.  Antalgic gait noted.  Low back exam increasing tightness.  Patient does have worsening pain with extension greater than 5 degrees.  Patient does have tightness with straight leg test.  No radicular symptoms.  Osteopathic findings  C3 flexed rotated and side bent left T3 extended rotated and side bent left inhaled rib L2 flexed rotated and side bent right L5 flexed rotated and side bent left Sacrum right on right       Assessment and Plan:  Effusion of left knee joint Patient had significant swelling again noted of the left knee.  This is only 1 month after the injection.  The patient is having instability of the knee.  Positive McMurray's.  Patient has  failed all other conservative therapy and do think we need to consider the possibility of advanced imaging.  MRI ordered today to further evaluate.  Increase activity slowly.  Discussed icing regimen and home exercises.  Follow-up again in 6 to 8 weeks.  Spondylolisthesis at L5-S1 level Chronic problem with exacerbation.  Likely some exacerbation because patient is ambulating differently secondary to knee pain.  Responding well to manipulation.  Discussed icing regimen, meloxicam, gabapentin and as  well as Zanaflex.  Patient will follow-up again in 6 to 8 weeks.   Nonallopathic problems  Decision today to treat with OMT was based on Physical Exam  After verbal consent patient was treated with HVLA, ME, FPR techniques in cervical, rib, thoracic, lumbar, and sacral  areas  Patient tolerated the procedure well with improvement in symptoms  Patient given exercises, stretches and lifestyle modifications  See medications in patient instructions if given  Patient will follow up in 4-8 weeks      The above documentation has been reviewed and is accurate and complete Carrie Pulley, DO        Note: This dictation was prepared with Dragon dictation along with smaller phrase technology. Any transcriptional errors that result from this process are unintentional.

## 2021-12-12 ENCOUNTER — Ambulatory Visit: Payer: BC Managed Care – PPO | Admitting: Family Medicine

## 2021-12-12 ENCOUNTER — Other Ambulatory Visit: Payer: Self-pay

## 2021-12-12 ENCOUNTER — Encounter: Payer: Self-pay | Admitting: Family Medicine

## 2021-12-12 VITALS — BP 118/82 | HR 100 | Ht 63.0 in | Wt 167.0 lb

## 2021-12-12 DIAGNOSIS — M25562 Pain in left knee: Secondary | ICD-10-CM

## 2021-12-12 DIAGNOSIS — M25462 Effusion, left knee: Secondary | ICD-10-CM | POA: Diagnosis not present

## 2021-12-12 DIAGNOSIS — M9901 Segmental and somatic dysfunction of cervical region: Secondary | ICD-10-CM

## 2021-12-12 DIAGNOSIS — M4317 Spondylolisthesis, lumbosacral region: Secondary | ICD-10-CM

## 2021-12-12 DIAGNOSIS — M9904 Segmental and somatic dysfunction of sacral region: Secondary | ICD-10-CM | POA: Diagnosis not present

## 2021-12-12 DIAGNOSIS — M9902 Segmental and somatic dysfunction of thoracic region: Secondary | ICD-10-CM

## 2021-12-12 DIAGNOSIS — M9908 Segmental and somatic dysfunction of rib cage: Secondary | ICD-10-CM | POA: Diagnosis not present

## 2021-12-12 DIAGNOSIS — G8929 Other chronic pain: Secondary | ICD-10-CM

## 2021-12-12 DIAGNOSIS — M9903 Segmental and somatic dysfunction of lumbar region: Secondary | ICD-10-CM | POA: Diagnosis not present

## 2021-12-12 NOTE — Patient Instructions (Signed)
MRI L knee [id number] See me in 4-6 weeks

## 2021-12-12 NOTE — Assessment & Plan Note (Signed)
Chronic problem with exacerbation.  Likely some exacerbation because patient is ambulating differently secondary to knee pain.  Responding well to manipulation.  Discussed icing regimen, meloxicam, gabapentin and as well as Zanaflex.  Patient will follow-up again in 6 to 8 weeks.

## 2021-12-12 NOTE — Assessment & Plan Note (Signed)
Patient had significant swelling again noted of the left knee.  This is only 1 month after the injection.  The patient is having instability of the knee.  Positive McMurray's.  Patient has failed all other conservative therapy and do think we need to consider the possibility of advanced imaging.  MRI ordered today to further evaluate.  Increase activity slowly.  Discussed icing regimen and home exercises.  Follow-up again in 6 to 8 weeks.

## 2021-12-23 ENCOUNTER — Other Ambulatory Visit: Payer: BC Managed Care – PPO

## 2022-01-02 ENCOUNTER — Other Ambulatory Visit: Payer: Self-pay

## 2022-01-02 ENCOUNTER — Ambulatory Visit
Admission: RE | Admit: 2022-01-02 | Discharge: 2022-01-02 | Disposition: A | Discharge status: Home / Self Care | Payer: BC Managed Care – PPO | Source: Ambulatory Visit | Attending: Family Medicine | Admitting: Family Medicine

## 2022-01-02 DIAGNOSIS — M25562 Pain in left knee: Secondary | ICD-10-CM

## 2022-01-02 DIAGNOSIS — G8929 Other chronic pain: Secondary | ICD-10-CM

## 2022-01-05 ENCOUNTER — Other Ambulatory Visit: Payer: Self-pay | Admitting: Radiology

## 2022-01-05 NOTE — Telephone Encounter (Signed)
Rx request denied for Xanax.  ?Patient needs OV to establish care.  ? ?Encounter closed.  ?

## 2022-01-08 NOTE — Progress Notes (Signed)
?Charlann Boxer D.O. ?[facility] ?[place] ?Phone: [phone] ?Subjective:   ?I, Vilma Meckel, am serving as a Education administrator for Dr. Hulan Saas. ?This visit occurred during the SARS-CoV-2 public health emergency.  Safety protocols were in place, including screening questions prior to the visit, additional usage of staff PPE, and extensive cleaning of exam room while observing appropriate contact time as indicated for disinfecting solutions.  ? ?I'm seeing this patient by the request  of:  [name], [name], MD ? ?CC: Low back pain, neck pain follow-up ? ?[id number]  ?Carrie [name] is a 51 y.o. female coming in with complaint of back and neck pain. OMT 12/12/2021. Also f/u for L knee pain. Patient states same per usual. No new complaints. Wants to talk about knee MRI.  Patient's MRI of the knee did show the patient did have a meniscal tear and some degenerative changes. ? ?Medications patient has been prescribed: Gabapentin ? ?Taking: ? ? ?  ? ? ? ? ?Reviewed prior external information including notes and imaging from previsou exam, outside providers and external EMR if available.  ? ?As well as notes that were available from care everywhere and other healthcare systems. ? ?Past medical history, social, surgical and family history all reviewed in electronic medical record.  No pertanent information unless stated regarding to the chief complaint.  ? ?Past Medical History:  ?Diagnosis Date  ? Allergy   ? Anemia   ? past hx low numbers with giving blood   ? Anxiety   ? Arthritis   ? hands   ? Depression   ? Endometriosis   ? Osteoporosis   ? slight   ? Thyroid disease   ?  ?No Known Allergies ? ? ?Review of Systems: ? No headache, visual changes, nausea, vomiting, diarrhea, constipation, dizziness, abdominal pain, skin rash, fevers, chills, night sweats, weight loss, swollen lymph nodes, body aches, joint swelling, chest pain, shortness of breath, mood changes. POSITIVE muscle  aches ? ?Objective  ?Blood pressure 122/78, pulse 88, height '5\' 3"'$  (1.6 m), weight 180 lb (81.6 kg), last menstrual period 03/13/2021, SpO2 96 %. ?  ?General: No apparent distress alert and oriented x3 mood and affect normal, dressed appropriately.  ?HEENT: Pupils equal, extraocular movements intact  ?Respiratory: Patient's speak in full sentences and does not appear short of breath  ?Left knee still has a small effusion noted.  Patient does have some limitation in flexion of the knee. ? ?Osteopathic findings ? ?C2 flexed rotated and side bent right ?C7 flexed rotated and side bent left ?T3 extended rotated and side bent right inhaled rib ?T6 extended rotated and side bent left ?L3 flexed rotated and side bent left  ?Sacrum right on right ? ? ? ? ?  ?Assessment and Plan: ? ?Effusion of left knee joint ?Left knee does have some mild degenerative changes noted of the knee in the mild to moderate category as well as having a meniscal injury.  We discussed icing regimen and home exercises.  Patient did have the MRI showing the meniscus and will potentially do surgery but would like to avoid until after her son's wedding.  Patient will follow-up in 4 to 6 weeks and will consider injection at that time ? ?Spondylolisthesis at L5-S1 level ?Known Sunday.  Likely some aggravation and exacerbation secondary to patient's knee as well.  No chronic issue.  Patient responds well though to osteopathic manipulation.  Discussed seeing him on a more regular basis at  the moment.  Does have meloxicam, Zanaflex and gabapentin.  Follow-up with me again in 4 to 8 weeks  ? ?Nonallopathic problems ? ?Decision today to treat with OMT was based on Physical Exam ? ?After verbal consent patient was treated with HVLA, ME, FPR techniques in cervical, rib, thoracic, lumbar, and sacral  areas ? ?Patient tolerated the procedure well with improvement in symptoms ? ?Patient given exercises, stretches and lifestyle modifications ? ?See medications in  patient instructions if given ? ?Patient will follow up in 4-8 weeks ? ?  ?The above documentation has been reviewed and is accurate and complete Carrie Pulley, DO ? ? ? ?  ? ? Note: This dictation was prepared with Dragon dictation along with smaller phrase technology. Any transcriptional errors that result from this process are unintentional.    ?  ?  ? ?

## 2022-01-09 ENCOUNTER — Other Ambulatory Visit: Payer: Self-pay

## 2022-01-09 ENCOUNTER — Ambulatory Visit: Payer: BC Managed Care – PPO | Admitting: Family Medicine

## 2022-01-09 VITALS — BP 122/78 | HR 88 | Ht 63.0 in | Wt 180.0 lb

## 2022-01-09 DIAGNOSIS — M9902 Segmental and somatic dysfunction of thoracic region: Secondary | ICD-10-CM

## 2022-01-09 DIAGNOSIS — M9904 Segmental and somatic dysfunction of sacral region: Secondary | ICD-10-CM | POA: Diagnosis not present

## 2022-01-09 DIAGNOSIS — M9908 Segmental and somatic dysfunction of rib cage: Secondary | ICD-10-CM

## 2022-01-09 DIAGNOSIS — M9903 Segmental and somatic dysfunction of lumbar region: Secondary | ICD-10-CM

## 2022-01-09 DIAGNOSIS — M4317 Spondylolisthesis, lumbosacral region: Secondary | ICD-10-CM

## 2022-01-09 DIAGNOSIS — M9901 Segmental and somatic dysfunction of cervical region: Secondary | ICD-10-CM

## 2022-01-09 DIAGNOSIS — M25462 Effusion, left knee: Secondary | ICD-10-CM

## 2022-01-09 NOTE — Assessment & Plan Note (Signed)
Left knee does have some mild degenerative changes noted of the knee in the mild to moderate category as well as having a meniscal injury.  We discussed icing regimen and home exercises.  Patient did have the MRI showing the meniscus and will potentially do surgery but would like to avoid until after her son's wedding.  Patient will follow-up in 4 to 6 weeks and will consider injection at that time ?

## 2022-01-09 NOTE — Assessment & Plan Note (Signed)
Known Sunday.  Likely some aggravation and exacerbation secondary to patient's knee as well.  No chronic issue.  Patient responds well though to osteopathic manipulation.  Discussed seeing him on a more regular basis at the moment.  Does have meloxicam, Zanaflex and gabapentin.  Follow-up with me again in 4 to 8 weeks ?

## 2022-01-09 NOTE — Patient Instructions (Signed)
See you again in 4-5  ?Injection before wedding ?

## 2022-01-26 ENCOUNTER — Other Ambulatory Visit: Payer: Self-pay | Admitting: Family Medicine

## 2022-02-04 NOTE — Progress Notes (Signed)
?Charlann Boxer D.O. ?[facility] ?[place] ?Phone: [phone] ?Subjective:   ?I, Carrie [name], am serving as a scribe for Dr. Hulan Saas. ? ?This visit occurred during the SARS-CoV-2 public health emergency.  Safety protocols were in place, including screening questions prior to the visit, additional usage of staff PPE, and extensive cleaning of exam room while observing appropriate contact time as indicated for disinfecting solutions.  ? ? ?I'm seeing this patient by the request  of:  [name], [name], MD ? ?CC: Back pain and neck pain follow-up ? ?[id number]  ?Carrie [name] is a 51 y.o. female coming in with complaint of back and neck pain. OMT 01/09/2022. Patient states having some mild increase in discomfort.  Patient had ago because patient did have a fall.  Discussed that it was an accidental 1 and fell on her knee.  Has some bruising but overall is doing okay. ? ?Medications patient has been prescribed: Effexor, Gabapentin ? ?Taking: ? ? ?  ? ? ? ? ?Reviewed prior external information including notes and imaging from previsou exam, outside providers and external EMR if available.  ? ?As well as notes that were available from care everywhere and other healthcare systems. ? ?Past medical history, social, surgical and family history all reviewed in electronic medical record.  No pertanent information unless stated regarding to the chief complaint.  ? ?Past Medical History:  ?Diagnosis Date  ? Allergy   ? Anemia   ? past hx low numbers with giving blood   ? Anxiety   ? Arthritis   ? hands   ? Depression   ? Endometriosis   ? Osteoporosis   ? slight   ? Thyroid disease   ?  ?No Known Allergies ? ? ?Review of Systems: ? No headache, visual changes, nausea, vomiting, diarrhea, constipation, dizziness, abdominal pain, skin rash, fevers, chills, night sweats, weight loss, swollen lymph nodes, body aches, joint swelling, chest pain, shortness of breath, mood changes.  POSITIVE muscle aches ? ?Objective  ?Blood pressure 122/78, pulse 90, height '5\' 3"'$  (1.6 m), weight 181 lb (82.1 kg), last menstrual period 03/13/2021, SpO2 98 %. ?  ?General: No apparent distress alert and oriented x3 mood and affect normal, dressed appropriately.  ?HEENT: Pupils equal, extraocular movements intact  ?Respiratory: Patient's speak in full sentences and does not appear short of breath  ?Cardiovascular: No lower extremity edema, non tender, no erythema  ?Neck exam does have some mild loss of lordosis.  Back exam does have some loss of lordosis as well.  Patient does have tenderness to palpation more in the lumbosacral area seems to be bilateral but actually more left greater than right today. ? ?Osteopathic findings ? ?C2 flexed rotated and side bent right ?C7 flexed rotated and side bent left ?T3 extended rotated and side bent right inhaled rib ?T9 extended rotated and side bent left ?L2 flexed rotated and side bent right ?Sacrum left on left ? ? ? ? ?  ?Assessment and Plan: ? ?Cervicogenic headache ?Chronic, with mild exacerbation secondary to patient having a fall recently.  Fell on her knee.  Discussed with patient the potential for injection again but patient wants to hold before the wedding of her son.  Discussed icing regimen and home exercises, increase activity slowly.  Follow-up again in 6 to 8 weeks  ? ?Nonallopathic problems ? ?Decision today to treat with OMT was based on Physical Exam ? ?After verbal consent patient was treated with HVLA,  ME, FPR techniques in cervical, rib, thoracic, lumbar, and sacral  areas ? ?Patient tolerated the procedure well with improvement in symptoms ? ?Patient given exercises, stretches and lifestyle modifications ? ?See medications in patient instructions if given ? ?Patient will follow up in 4-8 weeks ? ?  ? ? ?The above documentation has been reviewed and is accurate and complete [name], DO ? ? ? ?  ? ? Note: This dictation was prepared with Dragon  dictation along with smaller phrase technology. Any transcriptional errors that result from this process are unintentional.    ?  ?  ? ?

## 2022-02-05 ENCOUNTER — Ambulatory Visit: Payer: BC Managed Care – PPO | Admitting: Family Medicine

## 2022-02-05 VITALS — BP 122/78 | HR 90 | Ht 63.0 in | Wt 181.0 lb

## 2022-02-05 DIAGNOSIS — M9902 Segmental and somatic dysfunction of thoracic region: Secondary | ICD-10-CM

## 2022-02-05 DIAGNOSIS — M9908 Segmental and somatic dysfunction of rib cage: Secondary | ICD-10-CM

## 2022-02-05 DIAGNOSIS — M9904 Segmental and somatic dysfunction of sacral region: Secondary | ICD-10-CM

## 2022-02-05 DIAGNOSIS — M9903 Segmental and somatic dysfunction of lumbar region: Secondary | ICD-10-CM

## 2022-02-05 DIAGNOSIS — M79642 Pain in left hand: Secondary | ICD-10-CM | POA: Diagnosis not present

## 2022-02-05 DIAGNOSIS — G4486 Cervicogenic headache: Secondary | ICD-10-CM | POA: Diagnosis not present

## 2022-02-05 DIAGNOSIS — M9901 Segmental and somatic dysfunction of cervical region: Secondary | ICD-10-CM | POA: Diagnosis not present

## 2022-02-05 DIAGNOSIS — M79641 Pain in right hand: Secondary | ICD-10-CM | POA: Diagnosis not present

## 2022-02-05 NOTE — Patient Instructions (Signed)
Good to see you! ?Try to follow instructions ?See you again in 5 weeks (okay to double for knee injections) ?

## 2022-02-05 NOTE — Assessment & Plan Note (Signed)
Chronic, with mild exacerbation secondary to patient having a fall recently.  Fell on her knee.  Discussed with patient the potential for injection again but patient wants to hold before the wedding of her son.  Discussed icing regimen and home exercises, increase activity slowly.  Follow-up again in 6 to 8 weeks ?

## 2022-03-11 ENCOUNTER — Ambulatory Visit: Payer: BC Managed Care – PPO | Admitting: Family Medicine

## 2022-04-01 NOTE — Progress Notes (Unsigned)
Carrie [name] Phone: [phone] Subjective:   [name], am serving as a scribe for Dr. Hulan Saas.   I'm seeing this patient by the request  of:  [name], [name], MD  CC: Back pain follow-up, left knee pain follow-up  [id number]  Carrie [name] is a 51 y.o. female coming in with complaint of back and neck pain. OMT 02/05/2022. Patient states that she is doing well. No major flares since last visit.   Knee pain has improved. Has not had to use brace.   Medications patient has been prescribed: Effexor  Taking:         Reviewed prior external information including notes and imaging from previsou exam, outside providers and external EMR if available.   As well as notes that were available from care everywhere and other healthcare systems.  Past medical history, social, surgical and family history all reviewed in electronic medical record.  No pertanent information unless stated regarding to the chief complaint.   Past Medical History:  Diagnosis Date   Allergy    Anemia    past hx low numbers with giving blood    Anxiety    Arthritis    hands    Depression    Endometriosis    Osteoporosis    slight    Thyroid disease     No Known Allergies   Review of Systems:  No headache, visual changes, nausea, vomiting, diarrhea, constipation, dizziness, abdominal pain, skin rash, fevers, chills, night sweats, weight loss, swollen lymph nodes, body aches, joint swelling, chest pain, shortness of breath, mood changes. POSITIVE muscle aches  Objective  Blood pressure 114/82, pulse 91, height '5\' 3"'$  (1.6 m), weight 182 lb (82.6 kg), last menstrual period 03/13/2021, SpO2 99 %.   General: No apparent distress alert and oriented x3 mood and affect normal, dressed appropriately.  HEENT: Pupils equal, extraocular movements intact  Respiratory: Patient's speak in full sentences and does not appear short  of breath  Cardiovascular: No lower extremity edema, non tender, no erythema  Neuro: Cranial nerves II through XII are intact, neurovascularly intact in all extremities with 2+ low back exam does have some loss of lordosis.  Tightness noted mostly around the cervical region more in the C2 area.  Tightness patient fitted for  Osteopathic findings  C2 flexed rotated and side bent right C5 flexed rotated and side bent left T3 extended rotated and side bent right inhaled rib T9 extended rotated and side bent left L2 flexed rotated and side bent right Sacrum right on right       Assessment and Plan:  Spondylolisthesis at L5-S1 level Chronic problem with very mild exacerbation.  Patient is making some progress at this time.  Not using the medications on a regular basis but does have a muscle relaxer for breakthrough.  Patient is doing better than what she was doing prior to last visit.  Follow-up again in 6 to 8 weeks   Nonallopathic problems  Decision today to treat with OMT was based on Physical Exam  After verbal consent patient was treated with HVLA, ME, FPR techniques in cervical, rib, thoracic, lumbar, and sacral  areas  Patient tolerated the procedure well with improvement in symptoms  Patient given exercises, stretches and lifestyle modifications  See medications in patient instructions if given  Patient will follow up in 4-8 weeks     The above documentation has been reviewed and is accurate  and complete [name], DO        Note: This dictation was prepared with Dragon dictation along with smaller phrase technology. Any transcriptional errors that result from this process are unintentional.

## 2022-04-02 ENCOUNTER — Ambulatory Visit: Payer: BC Managed Care – PPO | Admitting: Family Medicine

## 2022-04-02 ENCOUNTER — Encounter: Payer: Self-pay | Admitting: Family Medicine

## 2022-04-02 VITALS — BP 114/82 | HR 91 | Ht 63.0 in | Wt 182.0 lb

## 2022-04-02 DIAGNOSIS — M4317 Spondylolisthesis, lumbosacral region: Secondary | ICD-10-CM | POA: Diagnosis not present

## 2022-04-02 DIAGNOSIS — M9903 Segmental and somatic dysfunction of lumbar region: Secondary | ICD-10-CM

## 2022-04-02 DIAGNOSIS — M9901 Segmental and somatic dysfunction of cervical region: Secondary | ICD-10-CM

## 2022-04-02 DIAGNOSIS — M9908 Segmental and somatic dysfunction of rib cage: Secondary | ICD-10-CM

## 2022-04-02 DIAGNOSIS — M9904 Segmental and somatic dysfunction of sacral region: Secondary | ICD-10-CM

## 2022-04-02 DIAGNOSIS — M9902 Segmental and somatic dysfunction of thoracic region: Secondary | ICD-10-CM

## 2022-04-02 NOTE — Assessment & Plan Note (Signed)
Chronic problem with very mild exacerbation.  Patient is making some progress at this time.  Not using the medications on a regular basis but does have a muscle relaxer for breakthrough.  Patient is doing better than what she was doing prior to last visit.  Follow-up again in 6 to 8 weeks

## 2022-04-02 NOTE — Patient Instructions (Signed)
Keep ship moving forward See me in 2 months

## 2022-05-04 ENCOUNTER — Other Ambulatory Visit: Payer: Self-pay | Admitting: Family Medicine

## 2022-06-03 NOTE — Progress Notes (Unsigned)
  [address] [facility] [place] Phone: [phone] Subjective:   [name], am serving as a scribe for Dr. Hulan Saas.  I'm seeing this patient by the request  of:  [name], [name], MD  CC: back and neck pain   [id number]  Carrie [name] is a 51 y.o. female coming in with complaint of back and neck pain. OMT 04/02/2022. Patient states that she is doing well. Knee pain is much less. Back and neck pain is the same.   Medications patient has been prescribed: Effexor, Gabapentin      Reviewed prior external information including notes and imaging from previsou exam, outside providers and external EMR if available.   As well as notes that were available from care everywhere and other healthcare systems.  Past medical history, social, surgical and family history all reviewed in electronic medical record.  No pertanent information unless stated regarding to the chief complaint.   Past Medical History:  Diagnosis Date   Allergy    Anemia    past hx low numbers with giving blood    Anxiety    Arthritis    hands    Depression    Endometriosis    Osteoporosis    slight    Thyroid disease     No Known Allergies   Review of Systems:  No headache, visual changes, nausea, vomiting, diarrhea, constipation, dizziness, abdominal pain, skin rash, fevers, chills, night sweats, weight loss, swollen lymph nodes, body aches, joint swelling, chest pain, shortness of breath, mood changes. POSITIVE muscle aches  Objective  Blood pressure 120/80, pulse 91, height '5\' 3"'$  (1.6 m), weight 182 lb (82.6 kg), last menstrual period 03/13/2021, SpO2 99 %.   General: No apparent distress alert and oriented x3 mood and affect normal, dressed appropriately.  HEENT: Pupils equal, extraocular movements intact  Respiratory: Patient's speak in full sentences and does not appear short of breath  Cardiovascular: No lower extremity edema, non  tender, no erythema  Gait normal  MSK:  Back low back tender noted patient does have some tightness with FABER test.  More tightness in the neck noted at this time.  Osteopathic findings  C3 flexed rotated and side bent right C6 flexed rotated and side bent left T3 extended rotated and side bent right inhaled rib T8 extended rotated and side bent left L3 flexed rotated and side bent right Sacrum right on right     Assessment and Plan:  Cervicogenic headache Tightness noted in the neck patient has a cervicogenic headaches..  Patient patient is concerned about sleeping in a apartment and wants to know if any good hotels.  Follow-up 6 to 8 weeks     Nonallopathic problems  Decision today to treat with OMT was based on Physical Exam  After verbal consent patient was treated with HVLA, ME, FPR techniques in cervical, rib, thoracic, lumbar, and sacral  areas  Patient tolerated the procedure well with improvement in symptoms  Patient given exercises, stretches and lifestyle modifications  See medications in patient instructions if given  Patient will follow up in 4-8 weeks     The above documentation has been reviewed and is accurate and complete [name], DO         Note: This dictation was prepared with Dragon dictation along with smaller phrase technology. Any transcriptional errors that result from this process are unintentional.

## 2022-06-04 ENCOUNTER — Ambulatory Visit: Payer: BC Managed Care – PPO | Admitting: Family Medicine

## 2022-06-04 VITALS — BP 120/80 | HR 91 | Ht 63.0 in | Wt 182.0 lb

## 2022-06-04 DIAGNOSIS — M9908 Segmental and somatic dysfunction of rib cage: Secondary | ICD-10-CM | POA: Diagnosis not present

## 2022-06-04 DIAGNOSIS — M9904 Segmental and somatic dysfunction of sacral region: Secondary | ICD-10-CM | POA: Diagnosis not present

## 2022-06-04 DIAGNOSIS — M9903 Segmental and somatic dysfunction of lumbar region: Secondary | ICD-10-CM

## 2022-06-04 DIAGNOSIS — G4486 Cervicogenic headache: Secondary | ICD-10-CM

## 2022-06-04 DIAGNOSIS — M9901 Segmental and somatic dysfunction of cervical region: Secondary | ICD-10-CM

## 2022-06-04 DIAGNOSIS — M9902 Segmental and somatic dysfunction of thoracic region: Secondary | ICD-10-CM

## 2022-06-04 NOTE — Assessment & Plan Note (Signed)
Tightness noted in the neck patient has a cervicogenic headaches..  Patient patient is concerned about sleeping in a apartment and wants to know if any good hotels.  Follow-up 6 to 8 weeks

## 2022-06-04 NOTE — Patient Instructions (Signed)
See me in 2 months 

## 2022-06-15 DIAGNOSIS — Z01419 Encounter for gynecological examination (general) (routine) without abnormal findings: Secondary | ICD-10-CM | POA: Diagnosis not present

## 2022-06-15 DIAGNOSIS — E039 Hypothyroidism, unspecified: Secondary | ICD-10-CM | POA: Diagnosis not present

## 2022-06-15 DIAGNOSIS — Z6831 Body mass index (BMI) 31.0-31.9, adult: Secondary | ICD-10-CM | POA: Diagnosis not present

## 2022-06-19 DIAGNOSIS — N939 Abnormal uterine and vaginal bleeding, unspecified: Secondary | ICD-10-CM | POA: Diagnosis not present

## 2022-06-19 DIAGNOSIS — N926 Irregular menstruation, unspecified: Secondary | ICD-10-CM | POA: Diagnosis not present

## 2022-07-22 ENCOUNTER — Other Ambulatory Visit: Payer: Self-pay | Admitting: Nurse Practitioner

## 2022-07-22 DIAGNOSIS — Z1231 Encounter for screening mammogram for malignant neoplasm of breast: Secondary | ICD-10-CM

## 2022-08-03 ENCOUNTER — Other Ambulatory Visit: Payer: Self-pay | Admitting: Family Medicine

## 2022-08-05 NOTE — Progress Notes (Unsigned)
[place] Phone: [phone] Subjective:   Carrie Carrie [name], am serving as a scribe for Dr. Hulan Saas.  I'm seeing this patient by the request  of:  [name], [name], MD  CC: Back and neck pain follow-up  [id number]  Carrie Carrie [name] is a 51 y.o. female coming in with complaint of back and neck pain. OMT 06/04/2022. Patient states that she is having headache today. Neck is tight. Patient has been having more issues with neck in past few weeks.   Knee pain is manageable and is much less than previously.   Medications patient has been prescribed: Gabapentin, Effexor  Taking:         Reviewed prior external information including notes and imaging from previsou exam, outside providers and external EMR if available.   As well as notes that were available from care everywhere and other healthcare systems.  Past medical history, social, surgical and family history all reviewed in electronic medical record.  Carrie [name] pertanent information unless stated regarding to the chief complaint.   Past Medical History:  Diagnosis Date   Allergy    Anemia    past hx low numbers with giving blood    Anxiety    Arthritis    hands    Depression    Endometriosis    Osteoporosis    slight    Thyroid disease     Review of Systems:  Carrie [name] , visual changes, nausea, vomiting, diarrhea, constipation, dizziness, abdominal pain, skin rash, fevers, chills, night sweats, weight loss, swollen lymph nodes, body aches, joint swelling, chest pain, shortness of breath, mood changes. POSITIVE muscle aches, headache  Objective  Blood pressure 128/84, pulse 91, height '5\' 3"'$  (1.6 m), weight 179 lb (81.2 kg), last menstrual period 03/13/2021, SpO2 98 %.   General: Carrie [name] apparent distress alert and oriented x3 mood and affect normal, dressed appropriately.  HEENT: Pupils equal, extraocular movements intact  Respiratory: Patient's speak in  full sentences and does not appear short of breath  Cardiovascular: Carrie [name] lower extremity edema, non tender, Carrie [name] erythema  Gait MSK:  Back does have loss of lordosis tightness of the neck  Tight FABER  5/5 strength     Osteopathic findings  C3 flexed rotated and side bent right C5 flexed rotated and side bent left T3 extended rotated and side bent right inhaled rib T8 extended rotated and side bent left L2 flexed rotated and side bent right Sacrum right on right     Assessment and Plan:  Spondylolisthesis at L5-S1 level Spondyloarthropathy noted.  Discussed icing regimen and home exercises. Chronic problem with exacerbation    Cervicogenic headache Chronic exacerbation  Again noted, some is lifting mechanics  RTC in 6 weeks     Nonallopathic problems  Decision today to treat with OMT was based on Physical Exam  After verbal consent patient was treated with HVLA, ME, FPR techniques in cervical, rib, thoracic, lumbar, and sacral  areas  Patient tolerated the procedure well with improvement in symptoms  Patient given exercises, stretches and lifestyle modifications  See medications in patient instructions if given  Patient will follow up in 4-8 weeks    The above documentation has been reviewed and is accurate and complete Carrie Pulley, DO          Note: This dictation was prepared with Dragon dictation along with smaller phrase technology. Any transcriptional errors that result from this process are unintentional.

## 2022-08-06 ENCOUNTER — Ambulatory Visit: Payer: BC Managed Care – PPO | Admitting: Family Medicine

## 2022-08-06 VITALS — BP 128/84 | HR 91 | Ht 63.0 in | Wt 179.0 lb

## 2022-08-06 DIAGNOSIS — M9901 Segmental and somatic dysfunction of cervical region: Secondary | ICD-10-CM

## 2022-08-06 DIAGNOSIS — M9903 Segmental and somatic dysfunction of lumbar region: Secondary | ICD-10-CM | POA: Diagnosis not present

## 2022-08-06 DIAGNOSIS — M79641 Pain in right hand: Secondary | ICD-10-CM | POA: Diagnosis not present

## 2022-08-06 DIAGNOSIS — M4317 Spondylolisthesis, lumbosacral region: Secondary | ICD-10-CM

## 2022-08-06 DIAGNOSIS — M9908 Segmental and somatic dysfunction of rib cage: Secondary | ICD-10-CM | POA: Diagnosis not present

## 2022-08-06 DIAGNOSIS — M9904 Segmental and somatic dysfunction of sacral region: Secondary | ICD-10-CM | POA: Diagnosis not present

## 2022-08-06 DIAGNOSIS — G4486 Cervicogenic headache: Secondary | ICD-10-CM | POA: Diagnosis not present

## 2022-08-06 DIAGNOSIS — M9902 Segmental and somatic dysfunction of thoracic region: Secondary | ICD-10-CM

## 2022-08-06 DIAGNOSIS — M79642 Pain in left hand: Secondary | ICD-10-CM | POA: Diagnosis not present

## 2022-08-06 IMAGING — DX DG LUMBAR SPINE 2-3V
3 series · 3 of 3 positions shown · non-contrast
Comparison: 08/09/2017

CLINICAL DATA: Low back pain that radiates into the right hip.

EXAM:
LUMBAR SPINE - 2-3 VIEW

[l-spine ap]
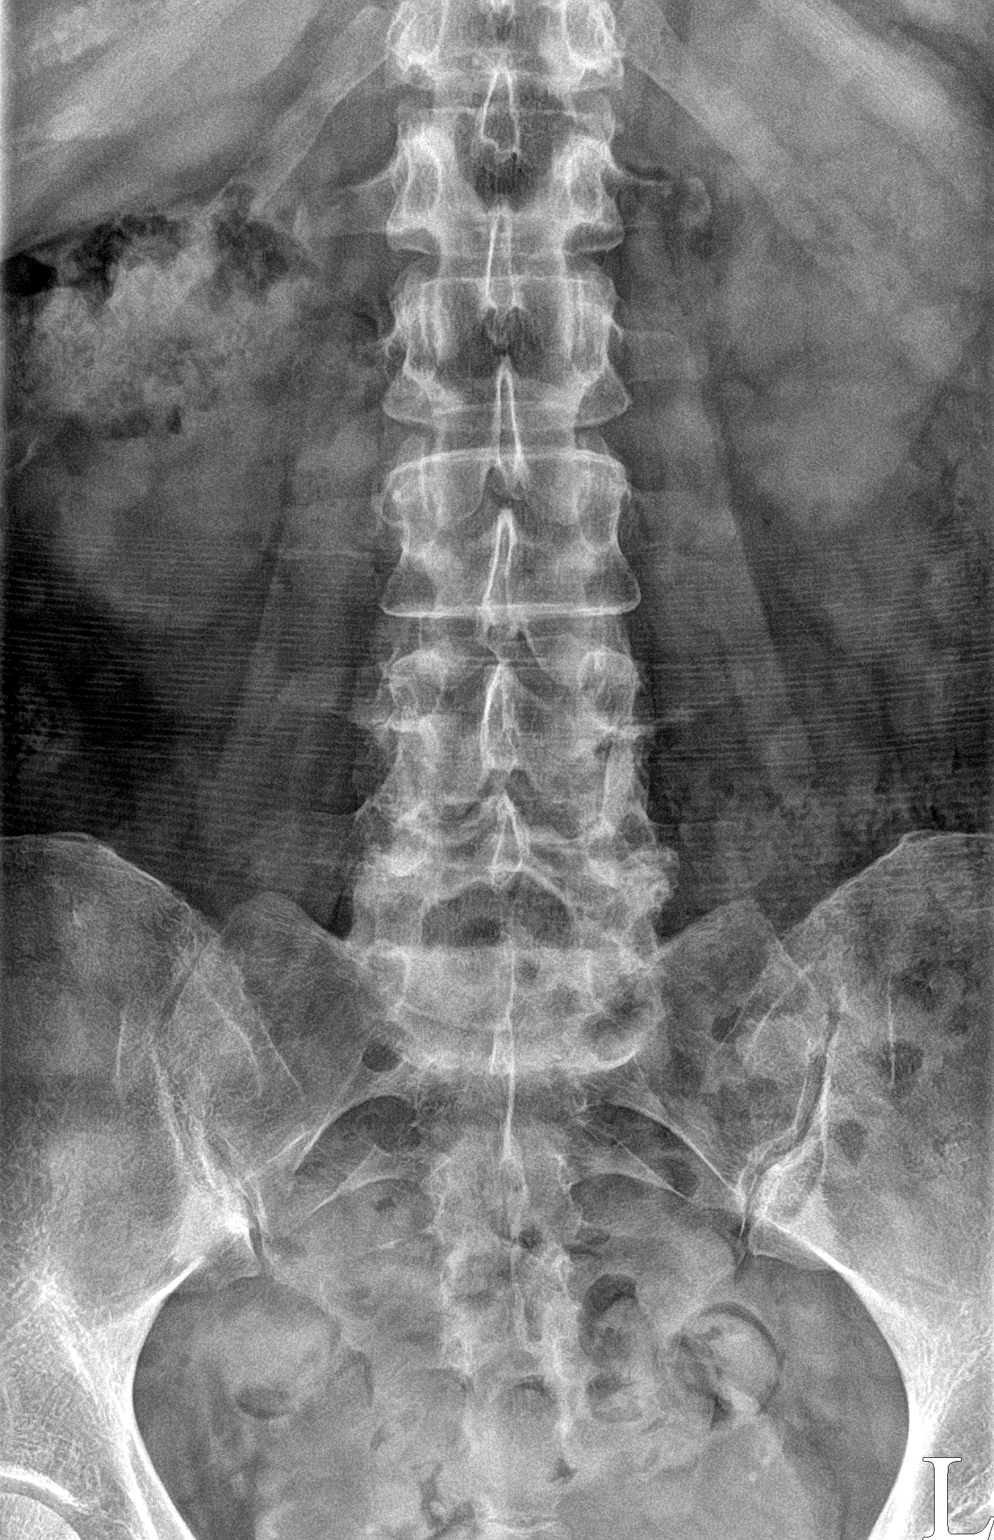

[l-spine lateral (1 of 2)]
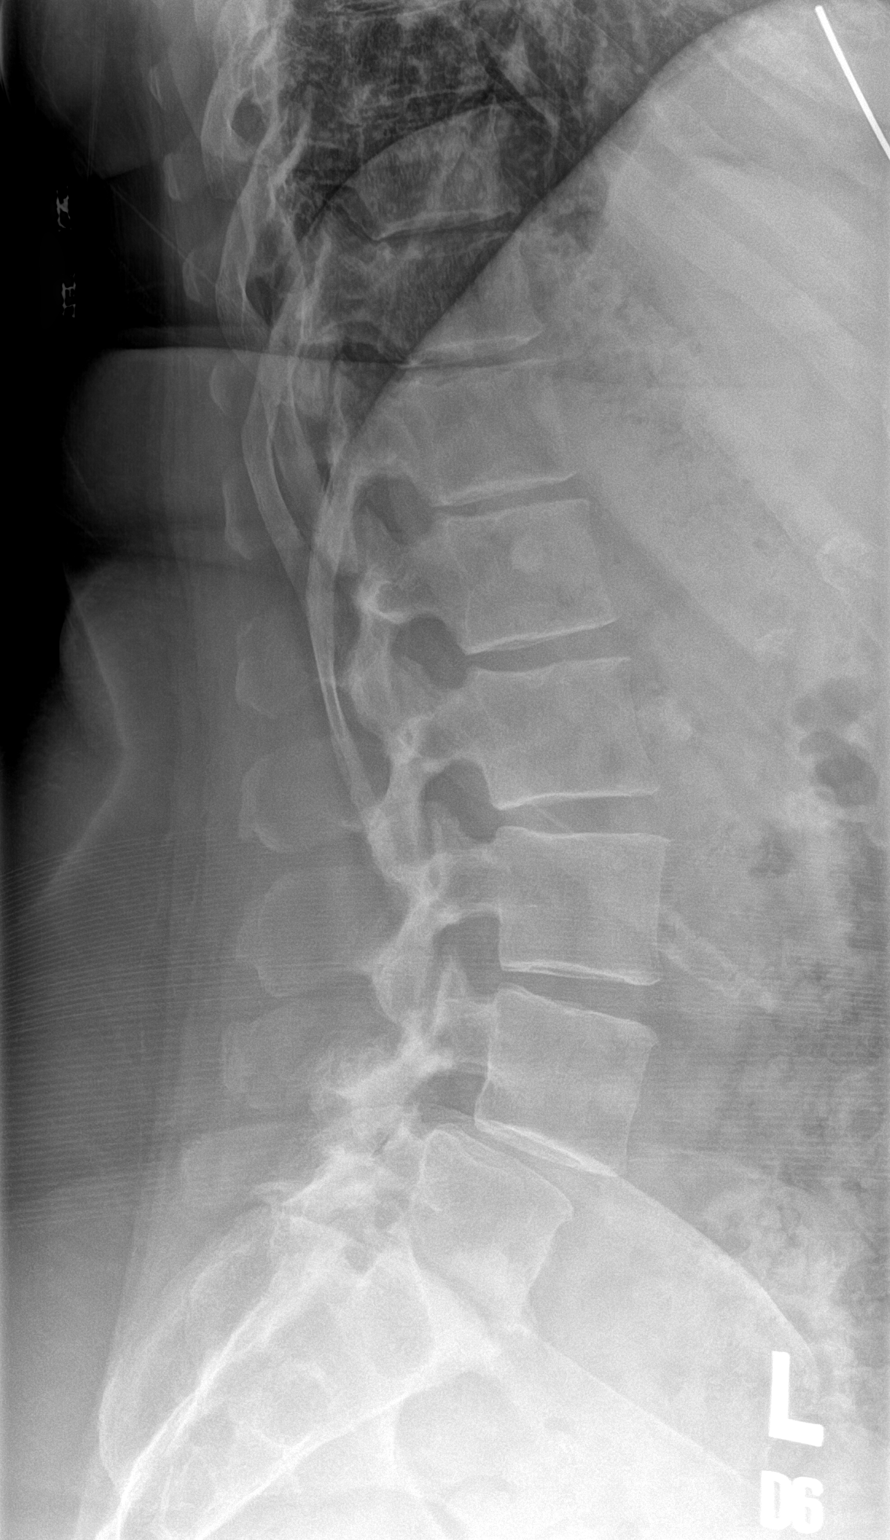

[l-spine lateral (2 of 2)]
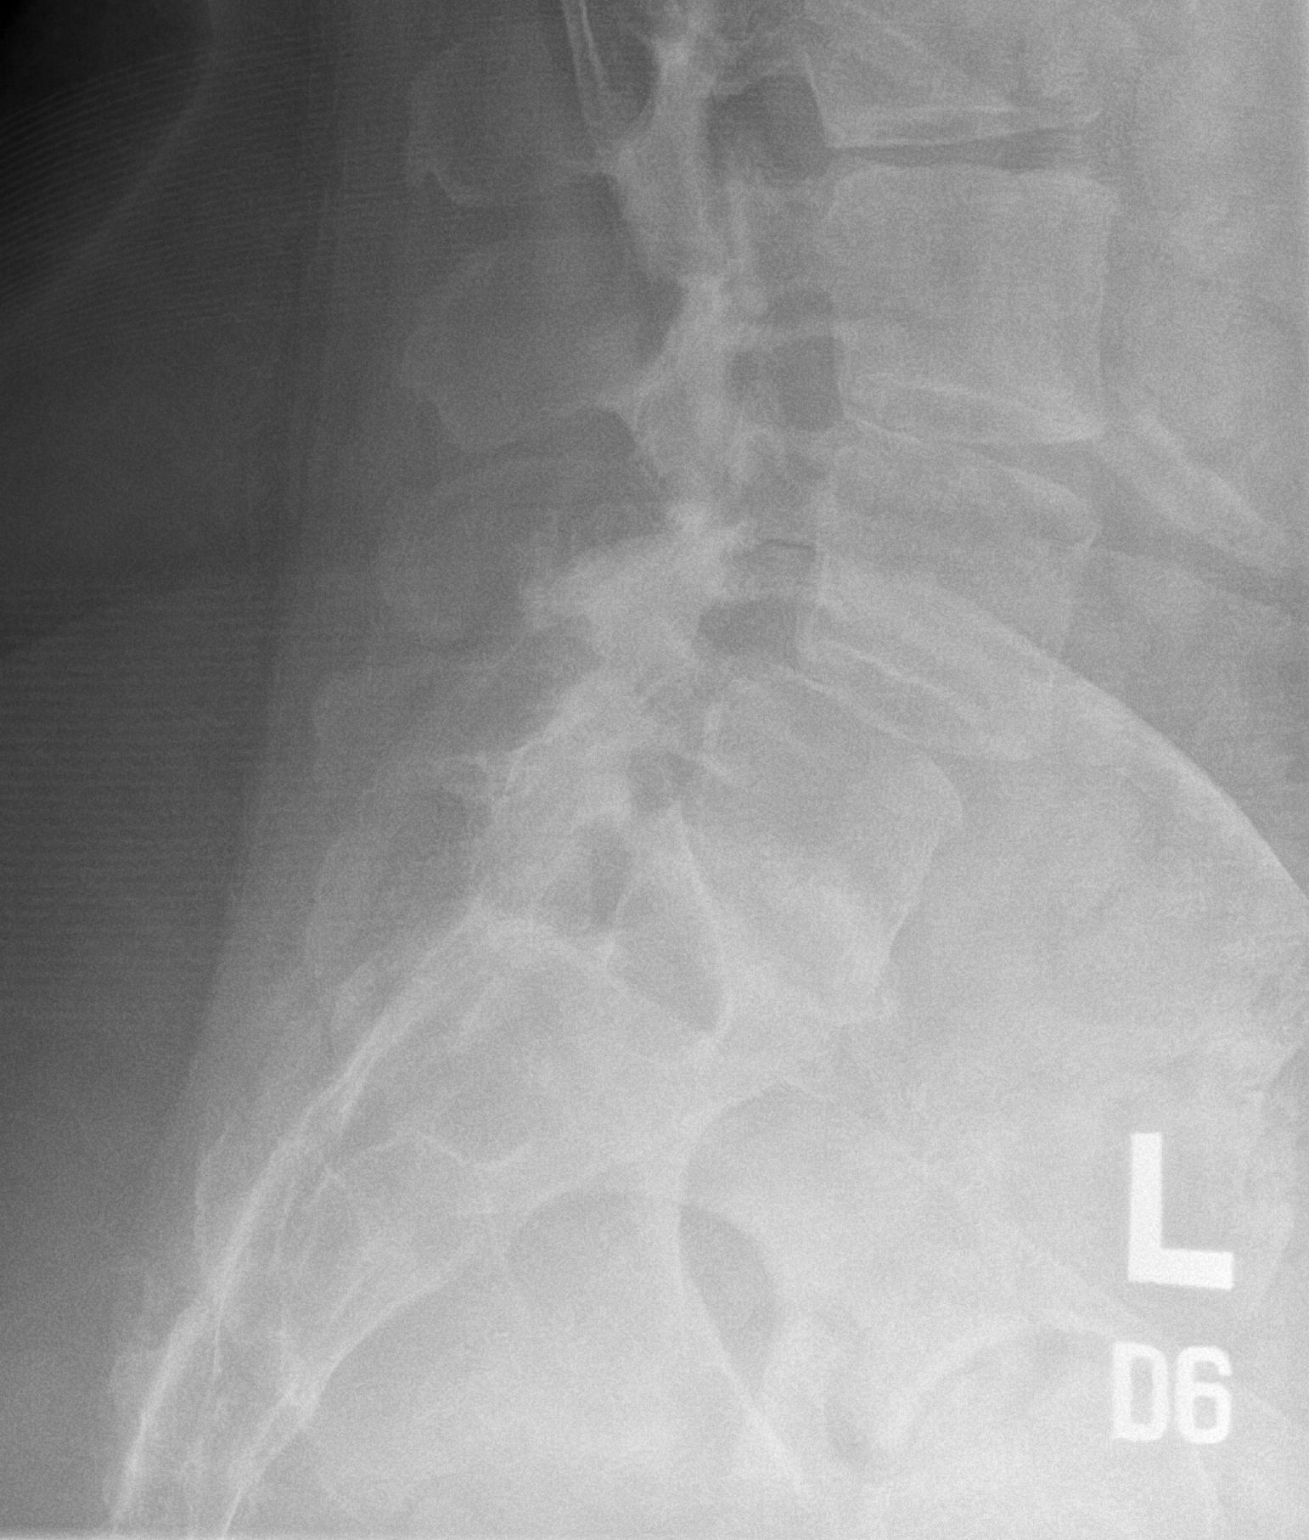

[3 of 3 positions shown; findings below may reference images not displayed]

FINDINGS: Two views of the lumbar spine were obtained. Again noted is
anterolisthesis at L4-L5. Anterolisthesis measures roughly 9 mm and
previously measured 7 mm. Mild disc space narrowing at L4-L5 is
unchanged. Again noted is disc space narrowing and endplate
sclerosis at L5-S1. Evidence for degenerative facet arthropathy in
the lower lumbar spine. The vertebral body heights are maintained.
Round structure overlying the L1 vertebral body on the lateral view
is probably related to stool.
IMPRESSION: 1. Degenerative facet arthropathy in the lower lumbar spine with
slightly increased anterolisthesis at L4-L5 .
2. Disc space narrowing at L5-S1.

## 2022-08-06 NOTE — Patient Instructions (Signed)
Good to see you Got good movement See me in 5-6 weeks

## 2022-08-06 NOTE — Assessment & Plan Note (Addendum)
Spondyloarthropathy noted.  Discussed icing regimen and home exercises. Chronic problem with exacerbation

## 2022-08-06 NOTE — Assessment & Plan Note (Signed)
Chronic exacerbation  Again noted, some is lifting mechanics  RTC in 6 weeks

## 2022-08-14 ENCOUNTER — Ambulatory Visit
Admission: RE | Admit: 2022-08-14 | Discharge: 2022-08-14 | Disposition: A | Discharge status: Home / Self Care | Payer: BC Managed Care – PPO | Source: Ambulatory Visit | Attending: Nurse Practitioner | Admitting: Nurse Practitioner

## 2022-08-14 DIAGNOSIS — Z1231 Encounter for screening mammogram for malignant neoplasm of breast: Secondary | ICD-10-CM | POA: Diagnosis not present

## 2022-09-16 NOTE — Progress Notes (Signed)
[place] Phone: [phone] Subjective:   [name], am serving as a scribe for Dr. Hulan Saas.  I'm seeing this patient by the request  of:  [name], [name], MD  CC: Neck and back pain follow-up  [id number]  Carrie [name] is a 51 y.o. female coming in with complaint of back and neck pain. OMT 08/06/2022. Also f/u for L knee pain. Patient states that she has some tightness on R side of neck and R shoulder. Knee pain is intermittent.    Medications patient has been prescribed: Effexor  Taking: Yes         Reviewed prior external information including notes and imaging from previsou exam, outside providers and external EMR if available.   As well as notes that were available from care everywhere and other healthcare systems.  Past medical history, social, surgical and family history all reviewed in electronic medical record.  No pertanent information unless stated regarding to the chief complaint.   Past Medical History:  Diagnosis Date   Allergy    Anemia    past hx low numbers with giving blood    Anxiety    Arthritis    hands    Depression    Endometriosis    Osteoporosis    slight    Thyroid disease     No Known Allergies   Review of Systems:  No headache, visual changes, nausea, vomiting, diarrhea, constipation, dizziness, abdominal pain, skin rash, fevers, chills, night sweats, weight loss, swollen lymph nodes, body aches, joint swelling, chest pain, shortness of breath, mood changes. POSITIVE muscle aches  Objective  Blood pressure 122/82, pulse 90, height '5\' 3"'$  (1.6 m), weight 182 lb (82.6 kg), last menstrual period 03/13/2021, SpO2 99 %.   General: No apparent distress alert and oriented x3 mood and affect normal, dressed appropriately.  HEENT: Pupils equal, extraocular movements intact  Respiratory: Patient's speak in full sentences and does not appear short of breath   Cardiovascular: No lower extremity edema, non tender, no erythema  More tightness in the lower back than anticipated.  More piriformis tenderness on the left side.  Osteopathic findings  C4 flexed rotated and side bent left T3 extended rotated and side bent right inhaled rib T9 extended rotated and side bent left L2 flexed rotated and side bent right Sacrum left on left       Assessment and Plan:  Spondylolisthesis at L5-S1 level Chronic problem with mild exacerbation.  Has been told for moving at this moment we discussed home exercises and icing regimen, and still discussed core strengthening with patient has not had time to decline to focus on herself at the moment.  Patient is to increase activity slowly otherwise.  Follow-up again with me in 6 to 8 weeks.  Medications include the Effexor and the gabapentin that have been prescribed by me.    Nonallopathic problems  Decision today to treat with OMT was based on Physical Exam  After verbal consent patient was treated with HVLA, ME, FPR techniques in cervical, rib, thoracic, lumbar, and sacral  areas  Patient tolerated the procedure well with improvement in symptoms  Patient given exercises, stretches and lifestyle modifications  See medications in patient instructions if given  Patient will follow up in 4-8 weeks    The above documentation has been reviewed and is accurate and complete [name], DO  Note: This dictation was prepared with Dragon dictation along with smaller phrase technology. Any transcriptional errors that result from this process are unintentional.

## 2022-09-18 ENCOUNTER — Encounter: Payer: Self-pay | Admitting: Family Medicine

## 2022-09-18 ENCOUNTER — Ambulatory Visit: Payer: BC Managed Care – PPO | Admitting: Family Medicine

## 2022-09-18 VITALS — BP 122/82 | HR 90 | Ht 63.0 in | Wt 182.0 lb

## 2022-09-18 DIAGNOSIS — M4317 Spondylolisthesis, lumbosacral region: Secondary | ICD-10-CM

## 2022-09-18 DIAGNOSIS — M9901 Segmental and somatic dysfunction of cervical region: Secondary | ICD-10-CM

## 2022-09-18 DIAGNOSIS — M9904 Segmental and somatic dysfunction of sacral region: Secondary | ICD-10-CM

## 2022-09-18 DIAGNOSIS — M9903 Segmental and somatic dysfunction of lumbar region: Secondary | ICD-10-CM | POA: Diagnosis not present

## 2022-09-18 DIAGNOSIS — M9902 Segmental and somatic dysfunction of thoracic region: Secondary | ICD-10-CM

## 2022-09-18 DIAGNOSIS — M9908 Segmental and somatic dysfunction of rib cage: Secondary | ICD-10-CM

## 2022-09-18 NOTE — Patient Instructions (Signed)
See me again in 5 weeks

## 2022-09-18 NOTE — Assessment & Plan Note (Signed)
Chronic problem with mild exacerbation.  Has been told for moving at this moment we discussed home exercises and icing regimen, and still discussed core strengthening with patient has not had time to decline to focus on herself at the moment.  Patient is to increase activity slowly otherwise.  Follow-up again with me in 6 to 8 weeks.  Medications include the Effexor and the gabapentin that have been prescribed by me.

## 2022-09-28 ENCOUNTER — Encounter: Payer: Self-pay | Admitting: Family Medicine

## 2022-10-11 ENCOUNTER — Encounter: Payer: Self-pay | Admitting: Family Medicine

## 2022-10-12 MED ORDER — GABAPENTIN 100 MG PO CAPS: 100.0000 mg | ORAL_CAPSULE | Freq: Two times a day (BID) | ORAL | 0 refills | Status: DC

## 2022-10-19 NOTE — Progress Notes (Unsigned)
[name] [place] Phone: [phone] Subjective:   Carrie [name], am serving as a scribe for Dr. Hulan Saas.  I'm seeing this patient by the request  of:  [name], [name], MD  CC: Knee pain, back pain  [id number]  Carrie [name] is a 51 y.o. female coming in with complaint of back and neck pain. OMT 09/18/2022. Patient states routine OMT. Left knee was doing okay but the last two weeks the knee has been "killing her" patient has been wearing a brace for two days which has helped. Patient describes pain ans "pinchy"  Medications patient has been prescribed: Gabapentin, Effexor  Taking:         Reviewed prior external information including notes and imaging from previsou exam, outside providers and external EMR if available.   As well as notes that were available from care everywhere and other healthcare systems.  Past medical history, social, surgical and family history all reviewed in electronic medical record.  No pertanent information unless stated regarding to the chief complaint.   Past Medical History:  Diagnosis Date   Allergy    Anemia    past hx low numbers with giving blood    Anxiety    Arthritis    hands    Depression    Endometriosis    Osteoporosis    slight    Thyroid disease     No Known Allergies   Review of Systems:  No headache, visual changes, nausea, vomiting, diarrhea, constipation, dizziness, abdominal pain, skin rash, fevers, chills, night sweats, weight loss, swollen lymph nodes, body aches, joint swelling, chest pain, shortness of breath, mood changes. POSITIVE muscle aches  Objective  Blood pressure 110/70, pulse 64, height '5\' 3"'$  (1.6 m), weight 180 lb (81.6 kg), last menstrual period 03/13/2021, SpO2 99 %.   General: No apparent distress alert and oriented x3 mood and affect normal, dressed appropriately.  HEENT: Pupils equal, extraocular movements intact   Respiratory: Patient's speak in full sentences and does not appear short of breath  Cardiovascular: No lower extremity edema, non tender, no erythema  Does have a mild antalgic gait noted.  Right and left knee does have some crepitus noted.  Tender to palpation over the.  Medial joint line.  No significant instability.  Lateral tracking of the patella noted Back exam does have tightness noted in the paraspinal muscular still.  Does have tightness in the lower left sacroiliac joint. Osteopathic findings  C2 flexed rotated and side bent right C6 flexed rotated and side bent left T3 extended rotated and side bent right inhaled rib T9 extended rotated and side bent left L2 flexed rotated and side bent right Sacrum left on left   After informed written and verbal consent, patient was seated on exam table. Left knee was prepped with alcohol swab and utilizing anterolateral approach, patient's left knee space was injected with 4:1  marcaine 0.5%: Kenalog '40mg'$ /dL. Patient tolerated the procedure well without immediate complications.    Assessment and Plan:  Effusion of left knee joint Patient made almost a year with 1 injection.  Repeat injections secondary to this chronic problem with symptoms.  Discussed with patient about icing regimen and home exercises, which activities to do and which ones to avoid.  Discussed VMO strengthening that I think will be more beneficial as well.  Follow-up again in 6 to 8 weeks  Spondylolisthesis at L5-S1 level Bilateral thesis noted.  This is affecting  her back pain.  Discussed with patient about icing regimen and home exercises.  Discussed which activities to do and which ones to avoid.  Increase activity slowly.  Follow-up again in 6 to 8 weeks    Nonallopathic problems  Decision today to treat with OMT was based on Physical Exam  After verbal consent patient was treated with HVLA, ME, FPR techniques in cervical, rib, thoracic, lumbar, and sacral   areas  Patient tolerated the procedure well with improvement in symptoms  Patient given exercises, stretches and lifestyle modifications  See medications in patient instructions if given  Patient will follow up in 4-8 weeks     The above documentation has been reviewed and is accurate and complete [name], DO         Note: This dictation was prepared with Dragon dictation along with smaller phrase technology. Any transcriptional errors that result from this process are unintentional.

## 2022-10-22 ENCOUNTER — Ambulatory Visit: Payer: BC Managed Care – PPO | Admitting: Family Medicine

## 2022-10-22 ENCOUNTER — Encounter: Payer: Self-pay | Admitting: Family Medicine

## 2022-10-22 VITALS — BP 110/70 | HR 64 | Ht 63.0 in | Wt 180.0 lb

## 2022-10-22 DIAGNOSIS — M9903 Segmental and somatic dysfunction of lumbar region: Secondary | ICD-10-CM

## 2022-10-22 DIAGNOSIS — M25462 Effusion, left knee: Secondary | ICD-10-CM | POA: Diagnosis not present

## 2022-10-22 DIAGNOSIS — M4317 Spondylolisthesis, lumbosacral region: Secondary | ICD-10-CM

## 2022-10-22 DIAGNOSIS — M9904 Segmental and somatic dysfunction of sacral region: Secondary | ICD-10-CM | POA: Diagnosis not present

## 2022-10-22 DIAGNOSIS — M9902 Segmental and somatic dysfunction of thoracic region: Secondary | ICD-10-CM | POA: Diagnosis not present

## 2022-10-22 DIAGNOSIS — M9908 Segmental and somatic dysfunction of rib cage: Secondary | ICD-10-CM

## 2022-10-22 DIAGNOSIS — M9901 Segmental and somatic dysfunction of cervical region: Secondary | ICD-10-CM | POA: Diagnosis not present

## 2022-10-22 NOTE — Assessment & Plan Note (Signed)
Patient made almost a year with 1 injection.  Repeat injections secondary to this chronic problem with symptoms.  Discussed with patient about icing regimen and home exercises, which activities to do and which ones to avoid.  Discussed VMO strengthening that I think will be more beneficial as well.  Follow-up again in 6 to 8 weeks

## 2022-10-22 NOTE — Assessment & Plan Note (Signed)
Bilateral thesis noted.  This is affecting her back pain.  Discussed with patient about icing regimen and home exercises.  Discussed which activities to do and which ones to avoid.  Increase activity slowly.  Follow-up again in 6 to 8 weeks

## 2022-10-22 NOTE — Patient Instructions (Addendum)
Good to see you  Injection in the left knee today  Hopefully the injection helps Follow up in 6-8 weeks

## 2022-11-03 ENCOUNTER — Other Ambulatory Visit: Payer: Self-pay | Admitting: Family Medicine

## 2022-11-03 NOTE — Telephone Encounter (Signed)
Sent patient MyChart message to check on current dosing.

## 2022-12-02 NOTE — Progress Notes (Unsigned)
[name] [address] [place] Phone: [phone] Subjective:   Carrie [name], am serving as a scribe for Dr. Hulan Saas.  I'm seeing this patient by the request  of:  [name], [name], MD  CC: Neck and back pain follow-up  [id number]  Carrie [name] is a 52 y.o. female coming in with complaint of back and neck pain. OMT 09/22/2022. Patient states left knee injection helped for less than a week. No new issues  Medications patient has been prescribed: Gabapentin, Effexor  Taking: Yes         Reviewed prior external information including notes and imaging from previsou exam, outside providers and external EMR if available.   As well as notes that were available from care everywhere and other healthcare systems.  Past medical history, social, surgical and family history all reviewed in electronic medical record.  No pertanent information unless stated regarding to the chief complaint.   Past Medical History:  Diagnosis Date   Allergy    Anemia    past hx low numbers with giving blood    Anxiety    Arthritis    hands    Depression    Endometriosis    Osteoporosis    slight    Thyroid disease     No Known Allergies   Review of Systems:  No headache, visual changes, nausea, vomiting, diarrhea, constipation, dizziness, abdominal pain, skin rash, fevers, chills, night sweats, weight loss, swollen lymph nodes, body aches, joint swelling, chest pain, shortness of breath, mood changes. POSITIVE muscle aches  Objective  Height '5\' 3"'$  (1.6 m), weight 181 lb (82.1 kg), last menstrual period 03/13/2021.   General: No apparent distress alert and oriented x3 mood and affect normal, dressed appropriately.  HEENT: Pupils equal, extraocular movements intact  Respiratory: Patient's speak in full sentences and does not appear short of breath  Cardiovascular: No lower extremity edema, non tender, no erythema  MSK:  Back low  back exam does have some mild loss of lordosis.  Patient does have tightness noted in the piriformis bilaterally left greater than righ tenderness over the gluteal tendon as well.  t.  Left knee exam does have some mild crepitus noted.  Trace effusion noted.  No significant instability noted.  Osteopathic findings  C2 flexed rotated and side bent right C6 flexed rotated and side bent left T3 extended rotated and side bent right inhaled rib T9 extended rotated and side bent left L2 flexed rotated and side bent right L5 flexed rotated and side bent left Sacrum right on right       Assessment and Plan:  Effusion of left knee joint Patient did not respond as well to the injection.  MRI previously did show loose bodies.  Patient still wants to hold on anything such as any surgical intervention.  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  Patient will continue to monitor level and see if she would like to do anything else.  Follow-up with me again in 6 to 8 weeks.    Nonallopathic problems  Decision today to treat with OMT was based on Physical Exam  After verbal consent patient was treated with HVLA, ME, FPR techniques in cervical, rib, thoracic, lumbar, and sacral  areas  Patient tolerated the procedure well with improvement in symptoms  Patient given exercises, stretches and lifestyle modifications  See medications  in patient instructions if given  Patient will follow up in 4-8 weeks    The above documentation has been reviewed and is accurate and complete Carrie Pulley, DO          Note: This dictation was prepared with Dragon dictation along with smaller phrase technology. Any transcriptional errors that result from this process are unintentional.

## 2022-12-03 ENCOUNTER — Ambulatory Visit: Payer: BC Managed Care – PPO | Admitting: Family Medicine

## 2022-12-03 VITALS — Ht 63.0 in | Wt 181.0 lb

## 2022-12-03 DIAGNOSIS — M9902 Segmental and somatic dysfunction of thoracic region: Secondary | ICD-10-CM

## 2022-12-03 DIAGNOSIS — M25462 Effusion, left knee: Secondary | ICD-10-CM

## 2022-12-03 DIAGNOSIS — M9904 Segmental and somatic dysfunction of sacral region: Secondary | ICD-10-CM | POA: Diagnosis not present

## 2022-12-03 DIAGNOSIS — M9901 Segmental and somatic dysfunction of cervical region: Secondary | ICD-10-CM | POA: Diagnosis not present

## 2022-12-03 DIAGNOSIS — M7602 Gluteal tendinitis, left hip: Secondary | ICD-10-CM | POA: Diagnosis not present

## 2022-12-03 DIAGNOSIS — M9903 Segmental and somatic dysfunction of lumbar region: Secondary | ICD-10-CM

## 2022-12-03 DIAGNOSIS — M9908 Segmental and somatic dysfunction of rib cage: Secondary | ICD-10-CM

## 2022-12-03 NOTE — Assessment & Plan Note (Signed)
Patient did not respond as well to the injection.  MRI previously did show loose bodies.  Patient still wants to hold on anything such as any surgical intervention.  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  Patient will continue to monitor level and see if she would like to do anything else.  Follow-up with me again in 6 to 8 weeks.

## 2022-12-03 NOTE — Patient Instructions (Signed)
[place] for L knee for next visit IF worsening pain call us See [place] in 6-8 weeks

## 2022-12-03 NOTE — Assessment & Plan Note (Signed)
Still having some tightness noted.  Will continue to monitor.  Can be more lumbar pathology and we will see.  Follow-up again in 6 to 8 weeks

## 2023-01-13 NOTE — Progress Notes (Unsigned)
[place] Phone: [phone] Subjective:   Carrie [name], am serving as a scribe for Dr. Hulan Saas.  I'm seeing this patient by the request  of:  [name], [name], MD  CC: Back and neck pain follow-up  [id number]  Carrie [name] is a 52 y.o. female coming in with complaint of back and neck pain. OMT 12/03/2022. Also here for L knee SynviscOne injection. Patient states overall continues to have some discomfort and pain.  Medications patient has been prescribed: Gabapentin  Taking:         Reviewed prior external information including notes and imaging from previsou exam, outside providers and external EMR if available.   As well as notes that were available from care everywhere and other healthcare systems.  Past medical history, social, surgical and family history all reviewed in electronic medical record.  [name] pertanent information unless stated regarding to the chief complaint.   Past Medical History:  Diagnosis Date   Allergy    Anemia    past hx low numbers with giving blood    Anxiety    Arthritis    hands    Depression    Endometriosis    Osteoporosis    slight    Thyroid disease     [name] Known Allergies   Review of Systems:  [name] headache, visual changes, nausea, vomiting, diarrhea, constipation, dizziness, abdominal pain, skin rash, fevers, chills, night sweats, weight loss, swollen lymph nodes, body aches, joint swelling, chest pain, shortness of breath, mood changes. POSITIVE muscle aches  Objective  Height '5\' 3"'$  (1.6 m), last menstrual period 03/13/2021.   General: [name] apparent distress alert and oriented x3 mood and affect normal, dressed appropriately.  HEENT: Pupils equal, extraocular movements intact  Respiratory: Patient's speak in full sentences and does not appear short of breath  Cardiovascular: [name] lower extremity edema, non tender, [name] erythema  Low back does have  some loss of lordosis noted.  Antalgic gait noted.  Patient continues to have pain in the left knee and does have a small effusion noted.  Osteopathic findings  C2 flexed rotated and side bent right C6 flexed rotated and side bent left T5 extended rotated and side bent right inhaled rib T7 extended rotated and side bent left L2 flexed rotated and side bent left Sacrum left on left   After informed written and verbal consent, patient was seated on exam table. Left knee was prepped with alcohol swab and utilizing anterolateral approach, patient's left knee space was injected with 60 mg per 3 mL of Synvisc 1 (sodium hyaluronate) in a prefilled syringe was injected easily into the knee through a 22-gauge needle..Patient tolerated the procedure well without immediate complications.      Assessment and Plan:  Effusion of left knee joint Patient is somewhat worsening pain and inflammation.  Symptoms given today.  Discussed icing regimen and home exercises.  Patient would need surgical intervention if this continues to worsen.  Patient will consider that.  Follow-up again in 6 to 8 weeks Will refer to orthopedic surgery to discuss if any surgical intervention is needed if this viscosupplementation fails. MRI showed loose bodies   Nonallopathic problems  Decision today to treat with OMT was based on Physical Exam  After verbal consent patient was treated with HVLA, ME, FPR techniques in cervical, rib, thoracic, lumbar, and sacral  areas  Patient tolerated the procedure well with improvement in symptoms  Patient  given exercises, stretches and lifestyle modifications  See medications in patient instructions if given  Patient will follow up in 4-8 weeks    The above documentation has been reviewed and is accurate and complete [name], DO          Note: This dictation was prepared with Dragon dictation along with smaller phrase technology. Any transcriptional errors that  result from this process are unintentional.

## 2023-01-14 ENCOUNTER — Ambulatory Visit: Payer: BC Managed Care – PPO | Admitting: Family Medicine

## 2023-01-14 ENCOUNTER — Encounter: Payer: Self-pay | Admitting: Family Medicine

## 2023-01-14 VITALS — Ht 63.0 in

## 2023-01-14 DIAGNOSIS — M9903 Segmental and somatic dysfunction of lumbar region: Secondary | ICD-10-CM

## 2023-01-14 DIAGNOSIS — M9901 Segmental and somatic dysfunction of cervical region: Secondary | ICD-10-CM | POA: Diagnosis not present

## 2023-01-14 DIAGNOSIS — M9908 Segmental and somatic dysfunction of rib cage: Secondary | ICD-10-CM

## 2023-01-14 DIAGNOSIS — G8929 Other chronic pain: Secondary | ICD-10-CM

## 2023-01-14 DIAGNOSIS — M25462 Effusion, left knee: Secondary | ICD-10-CM

## 2023-01-14 DIAGNOSIS — M25562 Pain in left knee: Secondary | ICD-10-CM

## 2023-01-14 DIAGNOSIS — M9905 Segmental and somatic dysfunction of pelvic region: Secondary | ICD-10-CM | POA: Diagnosis not present

## 2023-01-14 DIAGNOSIS — M1712 Unilateral primary osteoarthritis, left knee: Secondary | ICD-10-CM | POA: Diagnosis not present

## 2023-01-14 DIAGNOSIS — M9904 Segmental and somatic dysfunction of sacral region: Secondary | ICD-10-CM

## 2023-01-14 DIAGNOSIS — M9902 Segmental and somatic dysfunction of thoracic region: Secondary | ICD-10-CM

## 2023-01-14 DIAGNOSIS — M4317 Spondylolisthesis, lumbosacral region: Secondary | ICD-10-CM

## 2023-01-14 MED ORDER — HYLAN G-F 20 16 MG/2ML IX SOSY
48.0000 mg | PREFILLED_SYRINGE | Freq: Once | INTRA_ARTICULAR | Status: AC
  Administered 2023-01-14: 48 mg via INTRA_ARTICULAR

## 2023-01-14 NOTE — Assessment & Plan Note (Signed)
Patient is somewhat worsening pain and inflammation.  Symptoms given today.  Discussed icing regimen and home exercises.  Patient would need surgical intervention if this continues to worsen.  Patient will consider that.  Follow-up again in 6 to 8 weeks

## 2023-01-14 NOTE — Patient Instructions (Addendum)
Referral to Dr. Rhona Raider See me again in 5-6 weeks

## 2023-01-14 NOTE — Assessment & Plan Note (Signed)
Chronic problem with exacerbation, likely contributing because patient has had antalgic gait and changed other modalities.  Increase activity slowly.  Follow-up again in 6 to 8 weeks

## 2023-02-01 ENCOUNTER — Other Ambulatory Visit: Payer: Self-pay | Admitting: Family Medicine

## 2023-02-04 DIAGNOSIS — M79642 Pain in left hand: Secondary | ICD-10-CM | POA: Diagnosis not present

## 2023-02-08 DIAGNOSIS — M1712 Unilateral primary osteoarthritis, left knee: Secondary | ICD-10-CM | POA: Diagnosis not present

## 2023-02-24 DIAGNOSIS — M25562 Pain in left knee: Secondary | ICD-10-CM | POA: Diagnosis not present

## 2023-02-24 DIAGNOSIS — M25662 Stiffness of left knee, not elsewhere classified: Secondary | ICD-10-CM | POA: Diagnosis not present

## 2023-02-24 DIAGNOSIS — R260 Ataxic gait: Secondary | ICD-10-CM | POA: Diagnosis not present

## 2023-02-24 NOTE — Progress Notes (Unsigned)
[name] [address] [place] [address] Phone: [phone] Subjective:   Carrie [name], am serving as a scribe for Dr. Antoine Primas.  I'm seeing this patient by the request  of:  [name], Neta Mends, MD  CC: Back and neck pain  [id number]  Carrie [name] is a 52 y.o. female coming in with complaint of back and neck pain. OMT on 01/14/2023. Patient states doing well. Had PT evaluation recently.   Medications patient has been prescribed:   Taking:         Reviewed prior external information including notes and imaging from previsou exam, outside providers and external EMR if available.   As well as notes that were available from care everywhere and other healthcare systems.  Past medical history, social, surgical and family history all reviewed in electronic medical record.  No pertanent information unless stated regarding to the chief complaint.   Past Medical History:  Diagnosis Date   Allergy    Anemia    past hx low numbers with giving blood    Anxiety    Arthritis    hands    Depression    Endometriosis    Osteoporosis    slight    Thyroid disease     No Known Allergies   Review of Systems:  No headache, visual changes, nausea, vomiting, diarrhea, constipation, dizziness, abdominal pain, skin rash, fevers, chills, night sweats, weight loss, swollen lymph nodes, body aches, joint swelling, chest pain, shortness of breath, mood changes. POSITIVE muscle aches  Objective  Blood pressure 108/66, pulse 97, height  (1.6 m), weight 185 lb (83.9 kg), last menstrual period 03/13/2021, SpO2 98 %.   General: No apparent distress alert and oriented x3 mood and affect normal, dressed appropriately.  HEENT: Pupils equal, extraocular movements intact  Respiratory: Patient's speak in full sentences and does not appear short of breath  Cardiovascular: No lower extremity edema, non tender, no erythema  Gait still has a very  mild antalgic noted. Tender to palpation over the lower back.  Seems to be the sacroiliac joint.  Seems to be left greater than right.  Osteopathic findings  C2 flexed rotated and side bent right C6 flexed rotated and side bent left T3 extended rotated and side bent right inhaled rib T9 extended rotated and side bent left L2 flexed rotated and side bent right Sacrum left on left       Assessment and Plan:  Spondylolisthesis at L5-S1 level Chronic problem with exacerbation again.  Likely did not respond extremely well to osteopathic manipulation.  Discussed icing regimen and home exercises, which activities to do and which ones to avoid, increase activity slowly.  Discussed posture and ergonomics.  Will follow-up again in 6 to 8 weeks medications encourage patient to continue the gabapentin    Nonallopathic problems  Decision today to treat with OMT was based on Physical Exam  After verbal consent patient was treated with HVLA, ME, FPR techniques in cervical, rib, thoracic, lumbar, and sacral  areas  Patient tolerated the procedure well with improvement in symptoms  Patient given exercises, stretches and lifestyle modifications  See medications in patient instructions if given  Patient will follow up in 4-8 weeks     The above documentation has been reviewed and is accurate and complete Judi Saa, DO         Note: This dictation was prepared with Dragon dictation along with smaller phrase technology. Any transcriptional errors  that result from this process are unintentional.

## 2023-02-25 ENCOUNTER — Encounter: Payer: Self-pay | Admitting: Family Medicine

## 2023-02-25 ENCOUNTER — Ambulatory Visit: Payer: BC Managed Care – PPO | Admitting: Family Medicine

## 2023-02-25 VITALS — BP 108/66 | HR 97 | Ht 63.0 in | Wt 185.0 lb

## 2023-02-25 DIAGNOSIS — M9908 Segmental and somatic dysfunction of rib cage: Secondary | ICD-10-CM

## 2023-02-25 DIAGNOSIS — M25662 Stiffness of left knee, not elsewhere classified: Secondary | ICD-10-CM | POA: Diagnosis not present

## 2023-02-25 DIAGNOSIS — M25562 Pain in left knee: Secondary | ICD-10-CM | POA: Diagnosis not present

## 2023-02-25 DIAGNOSIS — M9901 Segmental and somatic dysfunction of cervical region: Secondary | ICD-10-CM

## 2023-02-25 DIAGNOSIS — M9903 Segmental and somatic dysfunction of lumbar region: Secondary | ICD-10-CM

## 2023-02-25 DIAGNOSIS — M9902 Segmental and somatic dysfunction of thoracic region: Secondary | ICD-10-CM

## 2023-02-25 DIAGNOSIS — M9904 Segmental and somatic dysfunction of sacral region: Secondary | ICD-10-CM | POA: Diagnosis not present

## 2023-02-25 DIAGNOSIS — M4317 Spondylolisthesis, lumbosacral region: Secondary | ICD-10-CM

## 2023-02-25 DIAGNOSIS — R260 Ataxic gait: Secondary | ICD-10-CM | POA: Diagnosis not present

## 2023-02-25 NOTE — Assessment & Plan Note (Signed)
Chronic problem with exacerbation again.  Likely did not respond extremely well to osteopathic manipulation.  Discussed icing regimen and home exercises, which activities to do and which ones to avoid, increase activity slowly.  Discussed posture and ergonomics.  Will follow-up again in 6 to 8 weeks medications encourage patient to continue the gabapentin

## 2023-02-25 NOTE — Patient Instructions (Signed)
Back moved great today No other large changes Lets see how PT goes See you again in 7-8 weeks

## 2023-03-02 DIAGNOSIS — M25662 Stiffness of left knee, not elsewhere classified: Secondary | ICD-10-CM | POA: Diagnosis not present

## 2023-03-02 DIAGNOSIS — M25562 Pain in left knee: Secondary | ICD-10-CM | POA: Diagnosis not present

## 2023-03-02 DIAGNOSIS — R260 Ataxic gait: Secondary | ICD-10-CM | POA: Diagnosis not present

## 2023-03-05 DIAGNOSIS — H40013 Open angle with borderline findings, low risk, bilateral: Secondary | ICD-10-CM | POA: Diagnosis not present

## 2023-03-05 DIAGNOSIS — M25662 Stiffness of left knee, not elsewhere classified: Secondary | ICD-10-CM | POA: Diagnosis not present

## 2023-03-05 DIAGNOSIS — R260 Ataxic gait: Secondary | ICD-10-CM | POA: Diagnosis not present

## 2023-03-05 DIAGNOSIS — M25562 Pain in left knee: Secondary | ICD-10-CM | POA: Diagnosis not present

## 2023-03-09 DIAGNOSIS — R260 Ataxic gait: Secondary | ICD-10-CM | POA: Diagnosis not present

## 2023-03-09 DIAGNOSIS — M25562 Pain in left knee: Secondary | ICD-10-CM | POA: Diagnosis not present

## 2023-03-09 DIAGNOSIS — M25662 Stiffness of left knee, not elsewhere classified: Secondary | ICD-10-CM | POA: Diagnosis not present

## 2023-03-11 ENCOUNTER — Ambulatory Visit (HOSPITAL_BASED_OUTPATIENT_CLINIC_OR_DEPARTMENT_OTHER): Payer: BC Managed Care – PPO | Admitting: Physical Therapy

## 2023-03-12 DIAGNOSIS — R260 Ataxic gait: Secondary | ICD-10-CM | POA: Diagnosis not present

## 2023-03-12 DIAGNOSIS — M25562 Pain in left knee: Secondary | ICD-10-CM | POA: Diagnosis not present

## 2023-03-12 DIAGNOSIS — M25662 Stiffness of left knee, not elsewhere classified: Secondary | ICD-10-CM | POA: Diagnosis not present

## 2023-03-16 DIAGNOSIS — M25562 Pain in left knee: Secondary | ICD-10-CM | POA: Diagnosis not present

## 2023-03-16 DIAGNOSIS — M25662 Stiffness of left knee, not elsewhere classified: Secondary | ICD-10-CM | POA: Diagnosis not present

## 2023-03-16 DIAGNOSIS — R260 Ataxic gait: Secondary | ICD-10-CM | POA: Diagnosis not present

## 2023-03-19 DIAGNOSIS — M25662 Stiffness of left knee, not elsewhere classified: Secondary | ICD-10-CM | POA: Diagnosis not present

## 2023-03-19 DIAGNOSIS — M25562 Pain in left knee: Secondary | ICD-10-CM | POA: Diagnosis not present

## 2023-03-19 DIAGNOSIS — R260 Ataxic gait: Secondary | ICD-10-CM | POA: Diagnosis not present

## 2023-03-23 DIAGNOSIS — M25562 Pain in left knee: Secondary | ICD-10-CM | POA: Diagnosis not present

## 2023-03-23 DIAGNOSIS — R260 Ataxic gait: Secondary | ICD-10-CM | POA: Diagnosis not present

## 2023-03-23 DIAGNOSIS — M25662 Stiffness of left knee, not elsewhere classified: Secondary | ICD-10-CM | POA: Diagnosis not present

## 2023-03-26 DIAGNOSIS — M25662 Stiffness of left knee, not elsewhere classified: Secondary | ICD-10-CM | POA: Diagnosis not present

## 2023-03-26 DIAGNOSIS — R260 Ataxic gait: Secondary | ICD-10-CM | POA: Diagnosis not present

## 2023-03-26 DIAGNOSIS — M25562 Pain in left knee: Secondary | ICD-10-CM | POA: Diagnosis not present

## 2023-03-30 DIAGNOSIS — R260 Ataxic gait: Secondary | ICD-10-CM | POA: Diagnosis not present

## 2023-03-30 DIAGNOSIS — M25662 Stiffness of left knee, not elsewhere classified: Secondary | ICD-10-CM | POA: Diagnosis not present

## 2023-03-30 DIAGNOSIS — M25562 Pain in left knee: Secondary | ICD-10-CM | POA: Diagnosis not present

## 2023-04-02 DIAGNOSIS — M25662 Stiffness of left knee, not elsewhere classified: Secondary | ICD-10-CM | POA: Diagnosis not present

## 2023-04-02 DIAGNOSIS — M25562 Pain in left knee: Secondary | ICD-10-CM | POA: Diagnosis not present

## 2023-04-02 DIAGNOSIS — R260 Ataxic gait: Secondary | ICD-10-CM | POA: Diagnosis not present

## 2023-04-05 ENCOUNTER — Other Ambulatory Visit: Payer: Self-pay | Admitting: Family Medicine

## 2023-04-06 ENCOUNTER — Other Ambulatory Visit: Payer: Self-pay

## 2023-04-06 ENCOUNTER — Encounter: Payer: Self-pay | Admitting: Family Medicine

## 2023-04-06 MED ORDER — GABAPENTIN 100 MG PO CAPS: 100.0000 mg | ORAL_CAPSULE | Freq: Two times a day (BID) | ORAL | 0 refills | Status: DC

## 2023-04-15 NOTE — Progress Notes (Signed)
[name] [address] [place] [address] Phone: [phone] Subjective:   INadine Counts, am serving as a scribe for Dr. Antoine Primas.  I'm seeing this patient by the request  of:  [name], Neta Mends, MD  CC: Back and neck pain follow-up  [id number]  Carrie [name] is a 52 y.o. female coming in with complaint of back and neck pain. OMT 02/25/2023. Patient states same per usual. No new concerns.  Medications patient has been prescribed: Gabapentin, Effexor  Taking: Yes         Reviewed prior external information including notes and imaging from previsou exam, outside providers and external EMR if available.   As well as notes that were available from care everywhere and other healthcare systems.  Past medical history, social, surgical and family history all reviewed in electronic medical record.  No pertanent information unless stated regarding to the chief complaint.   Past Medical History:  Diagnosis Date   Allergy    Anemia    past hx low numbers with giving blood    Anxiety    Arthritis    hands    Depression    Endometriosis    Osteoporosis    slight    Thyroid disease     No Known Allergies   Review of Systems:  No headache, visual changes, nausea, vomiting, diarrhea, constipation, dizziness, abdominal pain, skin rash, fevers, chills, night sweats, weight loss, swollen lymph nodes, body aches, joint swelling, chest pain, shortness of breath, mood changes. POSITIVE muscle aches  Objective  Blood pressure 132/82, pulse 96, height 5\' 3"  (1.6 m), weight 184 lb (83.5 kg), last menstrual period 03/13/2021, SpO2 98 %.   General: No apparent distress alert and oriented x3 mood and affect normal, dressed appropriately.  HEENT: Pupils equal, extraocular movements intact  Respiratory: Patient's speak in full sentences and does not appear short of breath  Cardiovascular: No lower extremity edema, non tender, no erythema   Antalgic gait favoring the left knee  Osteopathic findings  C6 flexed rotated and side bent left T4 extended rotated and side bent right inhaled rib T6 extended rotated and side bent left inhaled rib L2 flexed rotated and side bent right Sacrum right on right       Assessment and Plan:  Spondylolisthesis at L5-S1 level Spondylolisthesis noted.  Discussed icing regimen and home exercises, discussed which activities to do and which ones to avoid.  Increase activity slowly otherwise.  Discussed posture and ergonomics.  Discussed hip abductor strengthening.  Follow-up again in 6 to 8 weeks  Effusion of left knee joint Known arthritic changes.  Has not responded extremely well to the viscosupplementation.  We did discuss with patient that we can continue to monitor.  Patient has seen orthopedics and they did discuss the possibility of surgical intervention but would be a replacement.  Patient would like to hold on that if possible.  Follow-up with me again in 6 to 8 weeks otherwise    Nonallopathic problems  Decision today to treat with OMT was based on Physical Exam  After verbal consent patient was treated with HVLA, ME, FPR techniques in cervical, rib, thoracic, lumbar, and sacral  areas  Patient tolerated the procedure well with improvement in symptoms  Patient given exercises, stretches and lifestyle modifications  See medications in patient instructions if given  Patient will follow up in 4-8 weeks     The above documentation has been reviewed and is accurate and  complete [name], DO         Note: This dictation was prepared with Dragon dictation along with smaller phrase technology. Any transcriptional errors that result from this process are unintentional.

## 2023-04-16 ENCOUNTER — Encounter: Payer: Self-pay | Admitting: Family Medicine

## 2023-04-16 ENCOUNTER — Ambulatory Visit: Payer: BC Managed Care – PPO | Admitting: Family Medicine

## 2023-04-16 VITALS — BP 132/82 | HR 96 | Ht 63.0 in | Wt 184.0 lb

## 2023-04-16 DIAGNOSIS — M9904 Segmental and somatic dysfunction of sacral region: Secondary | ICD-10-CM

## 2023-04-16 DIAGNOSIS — M9901 Segmental and somatic dysfunction of cervical region: Secondary | ICD-10-CM

## 2023-04-16 DIAGNOSIS — M25462 Effusion, left knee: Secondary | ICD-10-CM | POA: Diagnosis not present

## 2023-04-16 DIAGNOSIS — M9908 Segmental and somatic dysfunction of rib cage: Secondary | ICD-10-CM

## 2023-04-16 DIAGNOSIS — M9902 Segmental and somatic dysfunction of thoracic region: Secondary | ICD-10-CM | POA: Diagnosis not present

## 2023-04-16 DIAGNOSIS — M9903 Segmental and somatic dysfunction of lumbar region: Secondary | ICD-10-CM

## 2023-04-16 DIAGNOSIS — M4317 Spondylolisthesis, lumbosacral region: Secondary | ICD-10-CM | POA: Diagnosis not present

## 2023-04-16 NOTE — Assessment & Plan Note (Signed)
Spondylolisthesis noted.  Discussed icing regimen and home exercises, discussed which activities to do and which ones to avoid.  Increase activity slowly otherwise.  Discussed posture and ergonomics.  Discussed hip abductor strengthening.  Follow-up again in 6 to 8 weeks

## 2023-04-16 NOTE — Assessment & Plan Note (Signed)
Known arthritic changes.  Has not responded extremely well to the viscosupplementation.  We did discuss with patient that we can continue to monitor.  Patient has seen orthopedics and they did discuss the possibility of surgical intervention but would be a replacement.  Patient would like to hold on that if possible.  Follow-up with me again in 6 to 8 weeks otherwise

## 2023-04-16 NOTE — Patient Instructions (Signed)
Good to see you! Enjoy time without Tammy See you again in 6-8 weeks

## 2023-05-03 ENCOUNTER — Other Ambulatory Visit: Payer: Self-pay | Admitting: Family Medicine

## 2023-05-27 NOTE — Progress Notes (Signed)
[name] [address] [place] [address] Phone: [phone] Subjective:   Bruce Donath, am serving as a scribe for Dr. Antoine Primas.  I'm seeing this patient by the request  of:  [name], Neta Mends, MD  CC: Back and neck pain follow-up, left knee pain follow-up  [id number]  Carrie [name] is a 52 y.o. female coming in with complaint of back and neck pain. OMT 04/16/2023. Patient states that her knee pain has increased recently. Did PT for knee which was not as helpful as she had hoped. Would like to do injection today.   Back has been achy.   Medications patient has been prescribed: Gabapentin, Effexor  Taking:         Reviewed prior external information including notes and imaging from previsou exam, outside providers and external EMR if available.   As well as notes that were available from care everywhere and other healthcare systems.  Past medical history, social, surgical and family history all reviewed in electronic medical record.  No pertanent information unless stated regarding to the chief complaint.   Past Medical History:  Diagnosis Date   Allergy    Anemia    past hx low numbers with giving blood    Anxiety    Arthritis    hands    Depression    Endometriosis    Osteoporosis    slight    Thyroid disease     No Known Allergies   Review of Systems:  No headache, visual changes, nausea, vomiting, diarrhea, constipation, dizziness, abdominal pain, skin rash, fevers, chills, night sweats, weight loss, swollen lymph nodes, body aches, joint swelling, chest pain, shortness of breath, mood changes. POSITIVE muscle aches  Objective  Blood pressure 118/82, pulse 81, height 5\' 3"  (1.6 m), weight 182 lb (82.6 kg), last menstrual period 03/13/2021, SpO2 98%.   General: No apparent distress alert and oriented x3 mood and affect normal, dressed appropriately.  HEENT: Pupils equal, extraocular movements intact   Respiratory: Patient's speak in full sentences and does not appear short of breath  Cardiovascular: No lower extremity edema, non tender, no erythema  Back exam does have some mild loss of lordosis.  Antalgic gait noted favoring the left knee.  Trace effusion noted.  Instability with valgus and varus force.  Osteopathic findings  C2 flexed rotated and side bent left C7 flexed rotated and side bent left T3 extended rotated and side bent right inhaled rib T9 extended rotated and side bent left L3 flexed rotated and side bent left Sacrum right on right  After informed written and verbal consent, patient was seated on exam table. Left knee was prepped with alcohol swab and utilizing anterolateral approach, patient's left knee space was injected with 4:1  marcaine 0.5%: Kenalog 40mg /dL. Patient tolerated the procedure well without immediate complications.     Assessment and Plan:  Degenerative arthritis of left knee Patient given injection today and tolerated the procedure well, discussed icing regimen and home exercises, discussed which activities to do and which ones to avoid.  Patient wants to avoid any type of surgery still at this time.  Left knee replacement at this time.  Follow-up with me again 8 weeks  Spondylolisthesis at L5-S1 level Spondylolisthesis of the low back noted.  Still responding extremely well to osteopathic manipulation.  Still encourage patient to get BMI under 30.  Continue to work on core strength but finding it difficult to be active secondary to the discomfort  and pain at this time.  Follow-up with me again in 8 weeks otherwise.    Nonallopathic problems  Decision today to treat with OMT was based on Physical Exam  After verbal consent patient was treated with HVLA, ME, FPR techniques in cervical, rib, thoracic, lumbar, and sacral  areas  Patient tolerated the procedure well with improvement in symptoms  Patient given exercises, stretches and lifestyle  modifications  See medications in patient instructions if given  Patient will follow up in 4-8 weeks     The above documentation has been reviewed and is accurate and complete Judi Saa, DO         Note: This dictation was prepared with Dragon dictation along with smaller phrase technology. Any transcriptional errors that result from this process are unintentional.

## 2023-05-28 ENCOUNTER — Ambulatory Visit: Payer: BC Managed Care – PPO | Admitting: Family Medicine

## 2023-05-28 ENCOUNTER — Encounter: Payer: Self-pay | Admitting: Family Medicine

## 2023-05-28 VITALS — BP 118/82 | HR 81 | Ht 63.0 in | Wt 182.0 lb

## 2023-05-28 DIAGNOSIS — M9902 Segmental and somatic dysfunction of thoracic region: Secondary | ICD-10-CM | POA: Diagnosis not present

## 2023-05-28 DIAGNOSIS — M9903 Segmental and somatic dysfunction of lumbar region: Secondary | ICD-10-CM

## 2023-05-28 DIAGNOSIS — M9908 Segmental and somatic dysfunction of rib cage: Secondary | ICD-10-CM | POA: Diagnosis not present

## 2023-05-28 DIAGNOSIS — M4317 Spondylolisthesis, lumbosacral region: Secondary | ICD-10-CM

## 2023-05-28 DIAGNOSIS — M9904 Segmental and somatic dysfunction of sacral region: Secondary | ICD-10-CM | POA: Diagnosis not present

## 2023-05-28 DIAGNOSIS — M9901 Segmental and somatic dysfunction of cervical region: Secondary | ICD-10-CM

## 2023-05-28 DIAGNOSIS — M1712 Unilateral primary osteoarthritis, left knee: Secondary | ICD-10-CM | POA: Diagnosis not present

## 2023-05-28 NOTE — Assessment & Plan Note (Signed)
Patient given injection today and tolerated the procedure well, discussed icing regimen and home exercises, discussed which activities to do and which ones to avoid.  Patient wants to avoid any type of surgery still at this time.  Left knee replacement at this time.  Follow-up with me again 8 weeks

## 2023-05-28 NOTE — Patient Instructions (Signed)
Good to see you  Keep doing your best  Injected the knee  See me again in 8 weeks

## 2023-05-28 NOTE — Assessment & Plan Note (Signed)
Spondylolisthesis of the low back noted.  Still responding extremely well to osteopathic manipulation.  Still encourage patient to get BMI under 30.  Continue to work on core strength but finding it difficult to be active secondary to the discomfort and pain at this time.  Follow-up with me again in 8 weeks otherwise.

## 2023-06-25 DIAGNOSIS — E039 Hypothyroidism, unspecified: Secondary | ICD-10-CM | POA: Diagnosis not present

## 2023-06-25 DIAGNOSIS — Z124 Encounter for screening for malignant neoplasm of cervix: Secondary | ICD-10-CM | POA: Diagnosis not present

## 2023-06-25 DIAGNOSIS — Z1151 Encounter for screening for human papillomavirus (HPV): Secondary | ICD-10-CM | POA: Diagnosis not present

## 2023-06-25 DIAGNOSIS — Z01419 Encounter for gynecological examination (general) (routine) without abnormal findings: Secondary | ICD-10-CM | POA: Diagnosis not present

## 2023-06-25 DIAGNOSIS — Z6831 Body mass index (BMI) 31.0-31.9, adult: Secondary | ICD-10-CM | POA: Diagnosis not present

## 2023-07-12 ENCOUNTER — Other Ambulatory Visit: Payer: Self-pay | Admitting: Internal Medicine

## 2023-07-12 DIAGNOSIS — Z1231 Encounter for screening mammogram for malignant neoplasm of breast: Secondary | ICD-10-CM

## 2023-07-23 ENCOUNTER — Ambulatory Visit: Payer: BC Managed Care – PPO | Admitting: Family Medicine

## 2023-07-28 ENCOUNTER — Encounter: Payer: Self-pay | Admitting: Family Medicine

## 2023-07-28 DIAGNOSIS — F418 Other specified anxiety disorders: Secondary | ICD-10-CM | POA: Diagnosis not present

## 2023-07-29 NOTE — Progress Notes (Signed)
[name] [address] [place] [address] Phone: [phone] Subjective:   Bruce Donath, am serving as a scribe for Dr. Antoine Primas.  I'm seeing this patient by the request  of:  [name], Neta Mends, MD  CC: Back and neck pain follow-up  [id number]  Carrie [name] is a 52 y.o. female coming in with complaint of back and neck pain. OMT 05/28/2023. Also f/u for L knee pain. Patient states that she would like an injection in her knee. Lasted 6 weeks.   Medications patient has been prescribed: Gabapentin, Effexor      Reviewed prior external information including notes and imaging from previsou exam, outside providers and external EMR if available.   As well as notes that were available from care everywhere and other healthcare systems.  Past medical history, social, surgical and family history all reviewed in electronic medical record.  No pertanent information unless stated regarding to the chief complaint.   Past Medical History:  Diagnosis Date   Allergy    Anemia    past hx low numbers with giving blood    Anxiety    Arthritis    hands    Depression    Endometriosis    Osteoporosis    slight    Thyroid disease     No Known Allergies   Review of Systems:  No headache, visual changes, nausea, vomiting, diarrhea, constipation, dizziness, abdominal pain, skin rash, fevers, chills, night sweats, weight loss, swollen lymph nodes, , joint swelling, chest pain, shortness of breath, mood changes. POSITIVE muscle aches, body aches  Objective  Blood pressure 122/82, pulse 87, height 5\' 3"  (1.6 m), weight 181 lb (82.1 kg), last menstrual period 03/13/2021, SpO2 97%.   General: No apparent distress alert and oriented x3 mood and affect normal, dressed appropriately.  Patient is tearful at the moment. HEENT: Pupils equal, extraocular movements intact  Respiratory: Patient's speak in full sentences and does not appear short of breath   Cardiovascular: No lower extremity edema, non tender, no erythema  Patient does have have significant tightness noted in the paraspinal musculature of the back.  Seems to have some loss of lordosis noted.  Tenderness over the left sacroiliac joint.  Osteopathic findings  C2 flexed rotated and side bent right C7 flexed rotated and side bent left T3 extended rotated and side bent right inhaled rib T9 extended rotated and side bent left L1 flexed rotated and side bent right L3 flexed rotated and side bent left Sacrum left on left  After informed written and verbal consent, patient was seated on exam table. Left knee was prepped with alcohol swab and utilizing anterolateral approach, patient's left knee space was injected with 4:1  marcaine 0.5%: Kenalog 40mg /dL. Patient tolerated the procedure well without immediate complications.    Assessment and Plan:  Degenerative arthritis of left knee Repeat injection given today, tolerated procedure well, I dispensed icing regimen and home exercises.  Increase activity slowly.  Patient still wants to avoid any surgical intervention.  Follow-up with me again in 2 to 3 months  Spondylolisthesis at L5-S1 level Chronic problem with exacerbation is likely secondary to more of patient also having increasing anxiety recently.  Discussed which activities to do and which ones to avoid.  Follow-up again in 6 to 8 weeks.    Nonallopathic problems  Decision today to treat with OMT was based on Physical Exam  After verbal consent patient was treated with HVLA, ME, FPR techniques  in cervical, rib, thoracic, lumbar, and sacral  areas  Patient tolerated the procedure well with improvement in symptoms  Patient given exercises, stretches and lifestyle modifications  See medications in patient instructions if given  Patient will follow up in 4-8 weeks     The above documentation has been reviewed and is accurate and complete Judi Saa, DO          Note: This dictation was prepared with Dragon dictation along with smaller phrase technology. Any transcriptional errors that result from this process are unintentional.

## 2023-07-30 ENCOUNTER — Ambulatory Visit: Payer: BC Managed Care – PPO | Admitting: Family Medicine

## 2023-07-30 ENCOUNTER — Encounter: Payer: Self-pay | Admitting: Family Medicine

## 2023-07-30 VITALS — BP 122/82 | HR 87 | Ht 63.0 in | Wt 181.0 lb

## 2023-07-30 DIAGNOSIS — M2550 Pain in unspecified joint: Secondary | ICD-10-CM | POA: Diagnosis not present

## 2023-07-30 DIAGNOSIS — M4317 Spondylolisthesis, lumbosacral region: Secondary | ICD-10-CM | POA: Diagnosis not present

## 2023-07-30 DIAGNOSIS — M9904 Segmental and somatic dysfunction of sacral region: Secondary | ICD-10-CM | POA: Diagnosis not present

## 2023-07-30 DIAGNOSIS — M9902 Segmental and somatic dysfunction of thoracic region: Secondary | ICD-10-CM | POA: Diagnosis not present

## 2023-07-30 DIAGNOSIS — M9903 Segmental and somatic dysfunction of lumbar region: Secondary | ICD-10-CM

## 2023-07-30 DIAGNOSIS — M9901 Segmental and somatic dysfunction of cervical region: Secondary | ICD-10-CM | POA: Diagnosis not present

## 2023-07-30 DIAGNOSIS — M1712 Unilateral primary osteoarthritis, left knee: Secondary | ICD-10-CM

## 2023-07-30 DIAGNOSIS — M9908 Segmental and somatic dysfunction of rib cage: Secondary | ICD-10-CM

## 2023-07-30 NOTE — Assessment & Plan Note (Signed)
Repeat injection given today, tolerated procedure well, I dispensed icing regimen and home exercises.  Increase activity slowly.  Patient still wants to avoid any surgical intervention.  Follow-up with me again in 2 to 3 months

## 2023-07-30 NOTE — Assessment & Plan Note (Signed)
Chronic problem with exacerbation is likely secondary to more of patient also having increasing anxiety recently.  Discussed which activities to do and which ones to avoid.  Follow-up again in 6 to 8 weeks.

## 2023-07-30 NOTE — Patient Instructions (Addendum)
Injected knee today Labs today See me in 5-6 weeks

## 2023-08-02 ENCOUNTER — Other Ambulatory Visit: Payer: Self-pay | Admitting: Family Medicine

## 2023-08-03 DIAGNOSIS — F418 Other specified anxiety disorders: Secondary | ICD-10-CM | POA: Diagnosis not present

## 2023-08-04 ENCOUNTER — Encounter: Payer: Self-pay | Admitting: Family Medicine

## 2023-08-05 DIAGNOSIS — M79641 Pain in right hand: Secondary | ICD-10-CM | POA: Diagnosis not present

## 2023-08-05 DIAGNOSIS — M79642 Pain in left hand: Secondary | ICD-10-CM | POA: Diagnosis not present

## 2023-08-06 LAB — MTHFR DNA ANALYSIS: Methylenetetrahydrofolate Reductase (MTHFR),DNA: POSITIVE

## 2023-08-10 DIAGNOSIS — F418 Other specified anxiety disorders: Secondary | ICD-10-CM | POA: Diagnosis not present

## 2023-08-20 ENCOUNTER — Ambulatory Visit
Admission: RE | Admit: 2023-08-20 | Discharge: 2023-08-20 | Disposition: A | Discharge status: Home / Self Care | Payer: BC Managed Care – PPO | Source: Ambulatory Visit | Attending: Internal Medicine | Admitting: Internal Medicine

## 2023-08-20 DIAGNOSIS — Z1231 Encounter for screening mammogram for malignant neoplasm of breast: Secondary | ICD-10-CM | POA: Diagnosis not present

## 2023-08-24 DIAGNOSIS — F418 Other specified anxiety disorders: Secondary | ICD-10-CM | POA: Diagnosis not present

## 2023-08-25 ENCOUNTER — Other Ambulatory Visit: Payer: Self-pay | Admitting: Internal Medicine

## 2023-08-25 DIAGNOSIS — R928 Other abnormal and inconclusive findings on diagnostic imaging of breast: Secondary | ICD-10-CM

## 2023-08-26 ENCOUNTER — Telehealth: Payer: Self-pay

## 2023-08-26 NOTE — Telephone Encounter (Signed)
[facility]  faxed over STAT order. Pt's appt on 09/10/2023.   Form is placed in provider's red folder.   Spoke to [place] and made them aware provider will be back next Tuesday. Verbalize understanding.

## 2023-09-04 ENCOUNTER — Ambulatory Visit
Admission: RE | Admit: 2023-09-04 | Discharge: 2023-09-04 | Disposition: A | Discharge status: Home / Self Care | Payer: BC Managed Care – PPO | Source: Ambulatory Visit | Attending: Internal Medicine | Admitting: Internal Medicine

## 2023-09-04 ENCOUNTER — Other Ambulatory Visit: Payer: Self-pay | Admitting: Internal Medicine

## 2023-09-04 DIAGNOSIS — R928 Other abnormal and inconclusive findings on diagnostic imaging of breast: Secondary | ICD-10-CM

## 2023-09-04 DIAGNOSIS — N6342 Unspecified lump in left breast, subareolar: Secondary | ICD-10-CM | POA: Diagnosis not present

## 2023-09-04 DIAGNOSIS — N6320 Unspecified lump in the left breast, unspecified quadrant: Secondary | ICD-10-CM

## 2023-09-06 NOTE — Telephone Encounter (Signed)
Form was faxed last week on 10/29. Received a confirmation.

## 2023-09-07 DIAGNOSIS — F418 Other specified anxiety disorders: Secondary | ICD-10-CM | POA: Diagnosis not present

## 2023-09-10 ENCOUNTER — Ambulatory Visit: Payer: BC Managed Care – PPO | Admitting: Family Medicine

## 2023-09-10 ENCOUNTER — Other Ambulatory Visit: Payer: BC Managed Care – PPO

## 2023-09-10 ENCOUNTER — Encounter: Payer: Self-pay | Admitting: Family Medicine

## 2023-09-10 ENCOUNTER — Ambulatory Visit
Admission: RE | Admit: 2023-09-10 | Discharge: 2023-09-10 | Disposition: A | Discharge status: Home / Self Care | Payer: BC Managed Care – PPO | Source: Ambulatory Visit | Attending: Internal Medicine | Admitting: Internal Medicine

## 2023-09-10 VITALS — BP 126/86 | HR 84 | Ht 63.0 in | Wt 183.0 lb

## 2023-09-10 DIAGNOSIS — M1712 Unilateral primary osteoarthritis, left knee: Secondary | ICD-10-CM

## 2023-09-10 DIAGNOSIS — M9908 Segmental and somatic dysfunction of rib cage: Secondary | ICD-10-CM

## 2023-09-10 DIAGNOSIS — M9901 Segmental and somatic dysfunction of cervical region: Secondary | ICD-10-CM | POA: Diagnosis not present

## 2023-09-10 DIAGNOSIS — M9902 Segmental and somatic dysfunction of thoracic region: Secondary | ICD-10-CM | POA: Diagnosis not present

## 2023-09-10 DIAGNOSIS — M4317 Spondylolisthesis, lumbosacral region: Secondary | ICD-10-CM | POA: Diagnosis not present

## 2023-09-10 DIAGNOSIS — M9904 Segmental and somatic dysfunction of sacral region: Secondary | ICD-10-CM | POA: Diagnosis not present

## 2023-09-10 DIAGNOSIS — D242 Benign neoplasm of left breast: Secondary | ICD-10-CM | POA: Diagnosis not present

## 2023-09-10 DIAGNOSIS — N6342 Unspecified lump in left breast, subareolar: Secondary | ICD-10-CM | POA: Diagnosis not present

## 2023-09-10 DIAGNOSIS — M9903 Segmental and somatic dysfunction of lumbar region: Secondary | ICD-10-CM

## 2023-09-10 DIAGNOSIS — N6320 Unspecified lump in the left breast, unspecified quadrant: Secondary | ICD-10-CM

## 2023-09-10 HISTORY — PX: BREAST BIOPSY: SHX20

## 2023-09-10 NOTE — Progress Notes (Signed)
[name] [address] [place] [address] Phone: [phone] Subjective:   INadine Counts, am serving as a scribe for Dr. Antoine Primas.  I'm seeing this patient by the request  of:  [name], Neta Mends, MD  CC: back and knee pain   [id number]  Carrie [name] is a 52 y.o. female coming in with complaint of back and neck pain Patient states same per usual. Would like injection in L knee, starting to give her more difficulty on a regular basis again.  Has noticed more swelling as well.  Medications patient has been prescribed:   Taking:      Patient is positive for the MTHFR gene. Patient is to be undergoing a breast biopsy today.  Reviewed prior external information including notes and imaging from previsou exam, outside providers and external EMR if available.   As well as notes that were available from care everywhere and other healthcare systems.  Past medical history, social, surgical and family history all reviewed in electronic medical record.  No pertanent information unless stated regarding to the chief complaint.   Past Medical History:  Diagnosis Date   Allergy    Anemia    past hx low numbers with giving blood    Anxiety    Arthritis    hands    Depression    Endometriosis    Osteoporosis    slight    Thyroid disease     No Known Allergies   Review of Systems:  No headache, visual changes, nausea, vomiting, diarrhea, constipation, dizziness, abdominal pain, skin rash, fevers, chills, night sweats, weight loss, swollen lymph nodes, body aches, joint swelling, chest pain, shortness of breath, mood changes. POSITIVE muscle aches  Objective  Blood pressure 126/86, pulse 84, height 5\' 3"  (1.6 m), weight 183 lb (83 kg), last menstrual period 03/13/2021, SpO2 96%.   General: No apparent distress alert and oriented x3 mood and affect normal, dressed appropriately.  HEENT: Pupils equal, extraocular movements intact   Respiratory: Patient's speak in full sentences and does not appear short of breath  Cardiovascular: No lower extremity edema, non tender, no erythema  MSK:  Back does have some loss of lordosis noted to the back.  Tightness in the neck noted left greater than right.  Somewhat different than previous exam.  Knee exam does have some degenerative arthritic changes noted.  Instability with valgus and varus force  Osteopathic findings  C2 flexed rotated and side bent right C7 flexed rotated and side bent left T3 extended rotated and side bent right inhaled rib T9 extended rotated and side bent left L3 flexed rotated and side bent right L5 flexed rotated and side bent left Sacrum right on right  After informed written and verbal consent, patient was seated on exam table. Left knee was prepped with alcohol swab and utilizing anterolateral approach, patient's left knee space was injected with 4:1  marcaine 0.5%: Kenalog 40mg /dL. Patient tolerated the procedure well without immediate complications.    Assessment and Plan:  Spondylolisthesis at L5-S1 level Problem neck pain, some radicular symptoms.  Discussed icing regimen and home exercises, discussed which activities to do and which ones to avoid, increase activity slowly.  Discussed posture and ergonomics again.  No change in medications but did refill but [email or web address] continue to monitor follow-up again in 6 to 8 weeks  Degenerative arthritis of left knee Chronic problem with worsening symptoms.  Patient is unable to do any surgical intervention secondary to  her job at this time.  Patient does feel that the injections do give her some benefit.  Discussed icing regimen and home exercises, increase activity slowly otherwise.  Follow-up again in 6 to 8 weeks.    Nonallopathic problems  Decision today to treat with OMT was based on Physical Exam  After verbal consent patient was treated with HVLA, ME, FPR techniques in cervical, rib,  thoracic, lumbar, and sacral  areas  Patient tolerated the procedure well with improvement in symptoms  Patient given exercises, stretches and lifestyle modifications  See medications in patient instructions if given  Patient will follow up in 4-8 weeks    The above documentation has been reviewed and is accurate and complete Judi Saa, DO          Note: This dictation was prepared with Dragon dictation along with smaller phrase technology. Any transcriptional errors that result from this process are unintentional.

## 2023-09-10 NOTE — Assessment & Plan Note (Signed)
Problem neck pain, some radicular symptoms.  Discussed icing regimen and home exercises, discussed which activities to do and which ones to avoid, increase activity slowly.  Discussed posture and ergonomics again.  No change in medications but did refill but [email or web address] continue to monitor follow-up again in 6 to 8 weeks

## 2023-09-10 NOTE — Patient Instructions (Signed)
Injection in L knee today Good to see you! See you again in 7-8 weeks

## 2023-09-10 NOTE — Assessment & Plan Note (Signed)
Chronic problem with worsening symptoms.  Patient is unable to do any surgical intervention secondary to her job at this time.  Patient does feel that the injections do give her some benefit.  Discussed icing regimen and home exercises, increase activity slowly otherwise.  Follow-up again in 6 to 8 weeks.

## 2023-09-13 LAB — SURGICAL PATHOLOGY

## 2023-09-21 DIAGNOSIS — F418 Other specified anxiety disorders: Secondary | ICD-10-CM | POA: Diagnosis not present

## 2023-10-18 ENCOUNTER — Other Ambulatory Visit: Payer: Self-pay | Admitting: Family Medicine

## 2023-10-29 NOTE — Progress Notes (Unsigned)
[name] [address] [place] [address] Phone: [phone] Subjective:   Carrie [name], am serving as a scribe for Dr. Antoine Primas.  I'm seeing this patient by the request  of:  [name], Neta Mends, MD  CC: back and neck pain follow up   [id number]  Carrie [name] is a 52 y.o. female coming in with complaint of back and neck pain. OMT on 09/10/2023. Patient states that she has been doing the same as last visit.   Medications patient has been prescribed: gabapentin  Taking: 100 mg during the day and 300 mg at night         Reviewed prior external information including notes and imaging from previsou exam, outside providers and external EMR if available.   As well as notes that were available from care everywhere and other healthcare systems.  Past medical history, social, surgical and family history all reviewed in electronic medical record.  No pertanent information unless stated regarding to the chief complaint.   Past Medical History:  Diagnosis Date   Allergy    Anemia    past hx low numbers with giving blood    Anxiety    Arthritis    hands    Depression    Endometriosis    Osteoporosis    slight    Thyroid disease     No Known Allergies   Review of Systems:  No  visual changes, nausea, vomiting, diarrhea, constipation, dizziness, abdominal pain, skin rash, fevers, chills, night sweats, weight loss, swollen lymph nodes, body aches, joint swelling, chest pain, shortness of breath, mood changes. POSITIVE muscle aches  Objective  Blood pressure 128/82, pulse 79, height 5\' 3"  (1.6 m), weight 182 lb (82.6 kg), last menstrual period 03/13/2021, SpO2 96%.   General: No apparent distress alert and oriented x3 mood and affect normal, dressed appropriately.  HEENT: Pupils equal, extraocular movements intact  Respiratory: Patient's speak in full sentences and does not appear short of breath  Cardiovascular: No lower  extremity edema, non tender, no erythema  MSK:  Back does have some loss lordosis noted.  Some tenderness to palpation in the paraspinal musculature.  Tightness noted in the L4 on the left side.  Negative straight leg test.  Neck exam does have some loss of sensation.  Osteopathic findings  C6 flexed rotated and side bent left T3 extended rotated and side bent right inhaled rib T9 extended rotated and side bent left L2 flexed rotated and side bent right L4 flexed rotated and side bent left L5 flexed rotated and side bent left Sacrum right on right       Assessment and Plan:  Cervicogenic headache Continues some tightness.  Discussed icing regimen and home exercises, discussed which activities to do and which ones to avoid.  Increase activity slowly.  Patient wants nothing I can ask.  Follow-up again in 6 to 8 weeks.  Spondylolisthesis at L5-S1 level Somewhat increasing tightness noted today.  Discussed with patient and icing regimen and home exercises.  Patient is attempting to lose some weight as well which I think will be beneficial.  Continue to work on core strengthening.  Follow-up with me again in 6 to 8 weeks otherwise    Nonallopathic problems  Decision today to treat with OMT was based on Physical Exam  After verbal consent patient was treated with HVLA, ME, FPR techniques in cervical, rib, thoracic, lumbar, and sacral  areas  Patient tolerated the procedure well  with improvement in symptoms  Patient given exercises, stretches and lifestyle modifications  See medications in patient instructions if given  Patient will follow up in 4-8 weeks     The above documentation has been reviewed and is accurate and complete Judi Saa, DO         Note: This dictation was prepared with Dragon dictation along with smaller phrase technology. Any transcriptional errors that result from this process are unintentional.

## 2023-11-01 ENCOUNTER — Ambulatory Visit: Payer: BC Managed Care – PPO | Admitting: Family Medicine

## 2023-11-01 ENCOUNTER — Encounter: Payer: Self-pay | Admitting: Family Medicine

## 2023-11-01 ENCOUNTER — Other Ambulatory Visit: Payer: Self-pay | Admitting: Family Medicine

## 2023-11-01 VITALS — BP 128/82 | HR 79 | Ht 63.0 in | Wt 182.0 lb

## 2023-11-01 DIAGNOSIS — G4486 Cervicogenic headache: Secondary | ICD-10-CM | POA: Diagnosis not present

## 2023-11-01 DIAGNOSIS — M9903 Segmental and somatic dysfunction of lumbar region: Secondary | ICD-10-CM

## 2023-11-01 DIAGNOSIS — M9904 Segmental and somatic dysfunction of sacral region: Secondary | ICD-10-CM

## 2023-11-01 DIAGNOSIS — M4317 Spondylolisthesis, lumbosacral region: Secondary | ICD-10-CM

## 2023-11-01 DIAGNOSIS — M9902 Segmental and somatic dysfunction of thoracic region: Secondary | ICD-10-CM

## 2023-11-01 DIAGNOSIS — M9901 Segmental and somatic dysfunction of cervical region: Secondary | ICD-10-CM

## 2023-11-01 DIAGNOSIS — M9908 Segmental and somatic dysfunction of rib cage: Secondary | ICD-10-CM

## 2023-11-01 NOTE — Assessment & Plan Note (Signed)
Somewhat increasing tightness noted today.  Discussed with patient and icing regimen and home exercises.  Patient is attempting to lose some weight as well which I think will be beneficial.  Continue to work on core strengthening.  Follow-up with me again in 6 to 8 weeks otherwise

## 2023-11-01 NOTE — Patient Instructions (Signed)
Good to see you! Have an uneventful new years See you again in 2 months

## 2023-11-01 NOTE — Assessment & Plan Note (Signed)
Continues some tightness.  Discussed icing regimen and home exercises, discussed which activities to do and which ones to avoid.  Increase activity slowly.  Patient wants nothing I can ask.  Follow-up again in 6 to 8 weeks.

## 2023-12-08 ENCOUNTER — Encounter: Payer: Self-pay | Admitting: Family Medicine

## 2023-12-08 ENCOUNTER — Ambulatory Visit: Payer: BC Managed Care – PPO | Admitting: Family Medicine

## 2023-12-08 VITALS — BP 124/86 | HR 66 | Ht 63.0 in

## 2023-12-08 DIAGNOSIS — M9903 Segmental and somatic dysfunction of lumbar region: Secondary | ICD-10-CM

## 2023-12-08 DIAGNOSIS — M9901 Segmental and somatic dysfunction of cervical region: Secondary | ICD-10-CM | POA: Diagnosis not present

## 2023-12-08 DIAGNOSIS — M9902 Segmental and somatic dysfunction of thoracic region: Secondary | ICD-10-CM | POA: Diagnosis not present

## 2023-12-08 DIAGNOSIS — M1712 Unilateral primary osteoarthritis, left knee: Secondary | ICD-10-CM

## 2023-12-08 DIAGNOSIS — M4317 Spondylolisthesis, lumbosacral region: Secondary | ICD-10-CM | POA: Diagnosis not present

## 2023-12-08 DIAGNOSIS — M9904 Segmental and somatic dysfunction of sacral region: Secondary | ICD-10-CM | POA: Diagnosis not present

## 2023-12-08 DIAGNOSIS — M9908 Segmental and somatic dysfunction of rib cage: Secondary | ICD-10-CM | POA: Diagnosis not present

## 2023-12-08 NOTE — Assessment & Plan Note (Signed)
 Spondylolisthesis noted.  Discussed icing regimen and home exercises, discussed which activities to do and which ones to avoid.  Still responding extremely well to osteopathic manipulation.  Worsening pain may need to consider another epidural.  Follow-up again in 6 to 8 weeks

## 2023-12-08 NOTE — Patient Instructions (Addendum)
 Injection today for knee See me again in March as scheduled

## 2023-12-08 NOTE — Progress Notes (Signed)
 [name] [address] [place] [address] Phone: [phone] Subjective:   Carrie [name] Carrie [name], am serving as a scribe for Dr. Arthea Claudene.  I'[name] seeing this patient by the request  of:  [name], Apolinar POUR, MD  CC: Low back and neck pain follow-up  YEP:Carrie [name]  Carrie [name] is a 53 y.o. female coming in with complaint of back and neck pain. OMT 11/01/2023. Patient states that her L knee is painful. Believes injection wore off. No new injury.   R side of lumbar spine is tight today.        Reviewed prior external information including notes and imaging from previsou exam, outside providers and external EMR if available.   As well as notes that were available from care everywhere and other healthcare systems.  Past medical history, social, surgical and family history all reviewed in electronic medical record.  No pertanent information unless stated regarding to the chief complaint.   Past Medical History:  Diagnosis Date   Allergy    Anemia    past hx low numbers with giving blood    Anxiety    Arthritis    hands    Depression    Endometriosis    Osteoporosis    slight    Thyroid  disease     No Known Allergies   Review of Systems:  No headache, visual changes, nausea, vomiting, diarrhea, constipation, dizziness, abdominal pain, skin rash, fevers, chills, night sweats, weight loss, swollen lymph nodes, body aches, joint swelling, chest pain, shortness of breath, mood changes. POSITIVE muscle aches  Objective  Blood pressure 124/86, pulse 66, height 5' 3 (1.6 [name]), last menstrual period 03/13/2021, SpO2 99%.   General: No apparent distress alert and oriented x3 mood and affect normal, dressed appropriately.  HEENT: Pupils equal, extraocular movements intact  Respiratory: Patient's speak in full sentences and does not appear short of breath  Cardiovascular: No lower extremity edema, non tender, no erythema  MSK:  Back does have  some loss lordosis noted.  Some tenderness to palpation in the paraspinal musculature.  Tightness with Deri right greater than left. Left knee does have some crepitus noted.  Trace effusion noted.  Osteopathic findings  C2 flexed rotated and side bent right C6 flexed rotated and side bent left T3 extended rotated and side bent right inhaled rib T9 extended rotated and side bent left L2 flexed rotated and side bent right L4 flexed rotated and side bent left Sacrum right on right   After informed written and verbal consent, patient was seated on exam table. Left knee was prepped with alcohol swab and utilizing anterolateral approach, patient's left knee space was injected with 4:1  marcaine 0.5%: Kenalog  40mg /dL. Patient tolerated the procedure well without immediate complications.      Assessment and Plan:  Spondylolisthesis at L5-S1 level Spondylolisthesis noted.  Discussed icing regimen and home exercises, discussed which activities to do and which ones to avoid.  Still responding extremely well to osteopathic manipulation.  Worsening pain may need to consider another epidural.  Follow-up again in 6 to 8 weeks  Degenerative arthritis of left knee Worsening discomfort and pain again.  Discussed which activities to do and which ones to avoid.  Increase activity slowly otherwise.  Increase home exercises and icing regimen.  Follow-up with me again in 6 to 8 weeks.    Nonallopathic problems  Decision today to treat with OMT was based on Physical Exam  After verbal consent patient  was treated with HVLA, ME, FPR techniques in cervical, rib, thoracic, lumbar, and sacral  areas  Patient tolerated the procedure well with improvement in symptoms  Patient given exercises, stretches and lifestyle modifications  See medications in patient instructions if given  Patient will follow up in 4-8 weeks    The above documentation has been reviewed and is accurate and complete Carrie [name]  Carrie Hu, DO          Note: This dictation was prepared with Dragon dictation along with smaller phrase technology. Any transcriptional errors that result from this process are unintentional.

## 2023-12-08 NOTE — Assessment & Plan Note (Signed)
Worsening discomfort and pain again.  Discussed which activities to do and which ones to avoid.  Increase activity slowly otherwise.  Increase home exercises and icing regimen.  Follow-up with me again in 6 to 8 weeks.

## 2024-01-06 NOTE — Progress Notes (Signed)
 [name] [address] [place] [address] Phone: [phone] Subjective:    I'm seeing this patient by the request  of:  [name], Neta Mends, MD  CC: Back pain, knee pain follow-up  BJY:NWGNFAOZHY  Carrie [name] is a 53 y.o. female coming in with complaint of back and neck pain. OMT 12/08/2023. Patient states  knee is still in pain. Here for an adjustment   Medications patient has been prescribed: Gabapentin, Effexor  Taking:         Reviewed prior external information including notes and imaging from previsou exam, outside providers and external EMR if available.   As well as notes that were available from care everywhere and other healthcare systems.  Past medical history, social, surgical and family history all reviewed in electronic medical record.  No pertanent information unless stated regarding to the chief complaint.   Past Medical History:  Diagnosis Date   Allergy    Anemia    past hx low numbers with giving blood    Anxiety    Arthritis    hands    Depression    Endometriosis    Osteoporosis    slight    Thyroid disease     No Known Allergies   Review of Systems:  No headache, visual changes, nausea, vomiting, diarrhea, constipation, dizziness, abdominal pain, skin rash, fevers, chills, night sweats, weight loss, swollen lymph nodes, body aches, joint swelling, chest pain, shortness of breath, mood changes. POSITIVE muscle aches  Objective  Blood pressure 130/80, pulse 97, height 5\' 3"  (1.6 m), weight 183 lb (83 kg), last menstrual period 03/13/2021, SpO2 99%.   General: No apparent distress alert and oriented x3 mood and affect normal, dressed appropriately.  HEENT: Pupils equal, extraocular movements intact  Respiratory: Patient's speak in full sentences and does not appear short of breath  Cardiovascular: No lower extremity edema, non tender, no erythema  MSK:  Back does have some loss of lordosis and some  tenderness to palpation noted.  Tightness with FABER test noted.  Left knee does have some degenerative changes noted.  Antalgic gait favoring the left knee.  Does have some instability noted.  Osteopathic findings  C3 flexed rotated and side bent right C6 flexed rotated and side bent left T3 extended rotated and side bent right inhaled rib T9 extended rotated and side bent left L2 flexed rotated and side bent right L3 flexed rotated and side bent left Sacrum right on right     Assessment and Plan:  Degenerative arthritis of left knee Degenerative changes noted.  Discussed with patient again at great length.  Still wants to avoid any surgical intervention but knows that it may not be necessary soon.  Discussed which activities to do and which ones to avoid.  Increase activity slowly.  Follow-up again in 6 to 8 weeks.  Spondylolisthesis at L5-S1 level Known spinal listhesis.  Worsening pain.  Toradol and Depo-Medrol injection given today.  Discussed with patient about icing regimen and home exercises, which activities to do and which ones to avoid.  Increase activity slowly.  Discussed icing regimen and home exercises.  Follow-up again in 6 to 8 weeks otherwise. Toradol and Depo-Medrol given.  Nonallopathic problems  Decision today to treat with OMT was based on Physical Exam  After verbal consent patient was treated with HVLA, ME, FPR techniques in cervical, rib, thoracic, lumbar, and sacral  areas  Patient tolerated the procedure well with improvement in symptoms  Patient given exercises, stretches and lifestyle modifications  See medications in patient instructions if given  Patient will follow up in 4-8 weeks    The above documentation has been reviewed and is accurate and complete Judi Saa, DO          Note: This dictation was prepared with Dragon dictation along with smaller phrase technology. Any transcriptional errors that result from this process are  unintentional.

## 2024-01-07 ENCOUNTER — Encounter: Payer: Self-pay | Admitting: Family Medicine

## 2024-01-07 ENCOUNTER — Ambulatory Visit: Payer: BC Managed Care – PPO | Admitting: Family Medicine

## 2024-01-07 VITALS — BP 130/80 | HR 97 | Ht 63.0 in | Wt 183.0 lb

## 2024-01-07 DIAGNOSIS — M9902 Segmental and somatic dysfunction of thoracic region: Secondary | ICD-10-CM | POA: Diagnosis not present

## 2024-01-07 DIAGNOSIS — M9908 Segmental and somatic dysfunction of rib cage: Secondary | ICD-10-CM

## 2024-01-07 DIAGNOSIS — M9903 Segmental and somatic dysfunction of lumbar region: Secondary | ICD-10-CM

## 2024-01-07 DIAGNOSIS — M9904 Segmental and somatic dysfunction of sacral region: Secondary | ICD-10-CM

## 2024-01-07 DIAGNOSIS — M2550 Pain in unspecified joint: Secondary | ICD-10-CM

## 2024-01-07 DIAGNOSIS — M9901 Segmental and somatic dysfunction of cervical region: Secondary | ICD-10-CM

## 2024-01-07 DIAGNOSIS — M1712 Unilateral primary osteoarthritis, left knee: Secondary | ICD-10-CM | POA: Diagnosis not present

## 2024-01-07 DIAGNOSIS — M4317 Spondylolisthesis, lumbosacral region: Secondary | ICD-10-CM

## 2024-01-07 MED ORDER — KETOROLAC TROMETHAMINE 30 MG/ML IJ SOLN
30.0000 mg | Freq: Once | INTRAMUSCULAR | Status: AC
  Administered 2024-01-07: 30 mg via INTRAMUSCULAR

## 2024-01-07 MED ORDER — METHYLPREDNISOLONE ACETATE 40 MG/ML IJ SUSP
40.0000 mg | Freq: Once | INTRAMUSCULAR | Status: AC
  Administered 2024-01-07: 40 mg via INTRAMUSCULAR

## 2024-01-07 NOTE — Assessment & Plan Note (Signed)
 Known spinal listhesis.  Worsening pain.  Toradol and Depo-Medrol injection given today.  Discussed with patient about icing regimen and home exercises, which activities to do and which ones to avoid.  Increase activity slowly.  Discussed icing regimen and home exercises.  Follow-up again in 6 to 8 weeks otherwise.

## 2024-01-07 NOTE — Assessment & Plan Note (Signed)
 Degenerative changes noted.  Discussed with patient again at great length.  Still wants to avoid any surgical intervention but knows that it may not be necessary soon.  Discussed which activities to do and which ones to avoid.  Increase activity slowly.  Follow-up again in 6 to 8 weeks.

## 2024-01-07 NOTE — Patient Instructions (Addendum)
 Good to see you. Half cocktail given today. 7-8 week follow up

## 2024-01-31 ENCOUNTER — Other Ambulatory Visit: Payer: Self-pay | Admitting: Family Medicine

## 2024-03-01 NOTE — Progress Notes (Signed)
 [facility] [address] [place] [address] Phone: [phone] Subjective:   IBryan Caprio, am serving as a scribe for Dr. Ronnell Coins.  I'm seeing this patient by the request  of:  [name], Joaquim Muir, MD  CC: Back and neck pain follow-up  BJY:NWGNFAOZHY  LYNLEA HAUER is a 53 y.o. female coming in with complaint of back and neck pain. OMT 01/07/2024. Also f/u for L knee pain. Patient states  doing well. Injection helped hit or miss. Cocktail helped.  Medications patient has been prescribed: Effexor   Taking:         Reviewed prior external information including notes and imaging from previsou exam, outside providers and external EMR if available.   As well as notes that were available from care everywhere and other healthcare systems.  Past medical history, social, surgical and family history all reviewed in electronic medical record.  No pertanent information unless stated regarding to the chief complaint.   Past Medical History:  Diagnosis Date   Allergy    Anemia    past hx low numbers with giving blood    Anxiety    Arthritis    hands    Depression    Endometriosis    Osteoporosis    slight    Thyroid  disease     No Known Allergies   Review of Systems:  No headache, visual changes, nausea, vomiting, diarrhea, constipation, dizziness, abdominal pain, skin rash, fevers, chills, night sweats, weight loss, swollen lymph nodes, body aches, joint swelling, chest pain, shortness of breath, mood changes. POSITIVE muscle aches  Objective  Blood pressure 114/78, pulse 87, height 5\' 3"  (1.6 m), weight 179 lb (81.2 kg), last menstrual period 03/13/2021, SpO2 98%.   General: No apparent distress alert and oriented x3 mood and affect normal, dressed appropriately.  HEENT: Pupils equal, extraocular movements intact  Respiratory: Patient's speak in full sentences and does not appear short of breath  Cardiovascular: No lower extremity  edema, non tender, no erythema  Gait MSK:  Back does have some loss lordosis.  Significant tightness noted at the cervical thoracic area as well as unfortunately at the occipital cervical area.  Lots of tightness in this area.  Tightness also noted around the right sacroiliac joint.  Left knee does have some swelling noted.  Some instability with valgus and varus force.  Osteopathic findings  C2 flexed rotated and side bent right C6 flexed rotated and side bent left T3 extended rotated and side bent right inhaled rib T9 extended rotated and side bent left L2 flexed rotated and side bent right Sacrum right on right    After informed written and verbal consent, patient was seated on exam table. Left knee was prepped with alcohol swab and utilizing anterolateral approach, patient's left knee space was injected with 4:1  marcaine 0.5%: Kenalog  40mg /dL. Patient tolerated the procedure well without immediate complications.   Assessment and Plan:  Cervicogenic headache Continue to work on posture not experienced discussed which activities to do in which ones to avoid.  More arthritis than what would be anticipated in patient's age.  Discussed icing regimen and home exercises, discussed increasing activity slowly.  Follow-up again in 6 to 8 weeks    Nonallopathic problems  Decision today to treat with OMT was based on Physical Exam  After verbal consent patient was treated with HVLA, ME, FPR techniques in cervical, rib, thoracic, lumbar, and sacral  areas  Patient tolerated the procedure well with  improvement in symptoms  Patient given exercises, stretches and lifestyle modifications  See medications in patient instructions if given  Patient will follow up in 4-8 weeks    The above documentation has been reviewed and is accurate and complete Elma Shands M Kristie Bracewell, DO          Note: This dictation was prepared with Dragon dictation along with smaller phrase technology. Any  transcriptional errors that result from this process are unintentional.

## 2024-03-02 ENCOUNTER — Encounter: Payer: Self-pay | Admitting: Family Medicine

## 2024-03-02 ENCOUNTER — Ambulatory Visit: Admitting: Family Medicine

## 2024-03-02 VITALS — BP 114/78 | HR 87 | Ht 63.0 in | Wt 179.0 lb

## 2024-03-02 DIAGNOSIS — M9903 Segmental and somatic dysfunction of lumbar region: Secondary | ICD-10-CM | POA: Diagnosis not present

## 2024-03-02 DIAGNOSIS — M9902 Segmental and somatic dysfunction of thoracic region: Secondary | ICD-10-CM

## 2024-03-02 DIAGNOSIS — G4486 Cervicogenic headache: Secondary | ICD-10-CM | POA: Diagnosis not present

## 2024-03-02 DIAGNOSIS — M9908 Segmental and somatic dysfunction of rib cage: Secondary | ICD-10-CM

## 2024-03-02 DIAGNOSIS — M9904 Segmental and somatic dysfunction of sacral region: Secondary | ICD-10-CM | POA: Diagnosis not present

## 2024-03-02 DIAGNOSIS — M9901 Segmental and somatic dysfunction of cervical region: Secondary | ICD-10-CM | POA: Diagnosis not present

## 2024-03-02 DIAGNOSIS — M1712 Unilateral primary osteoarthritis, left knee: Secondary | ICD-10-CM

## 2024-03-02 NOTE — Assessment & Plan Note (Signed)
 Chronic, with worsening symptoms.  Discussed which activities to do and which ones to avoid.  Increase activity slowly.  Patient still wants to hold on any type of surgical intervention.  Will continue to try to lose weight.  Increase activity slowly otherwise.  Follow-up with me again in 6 to 8 weeks.

## 2024-03-02 NOTE — Patient Instructions (Signed)
 Injection in L knee Good to see you! See you again in 2 months

## 2024-03-02 NOTE — Assessment & Plan Note (Signed)
 Continue to work on posture not experienced discussed which activities to do in which ones to avoid.  More arthritis than what would be anticipated in patient's age.  Discussed icing regimen and home exercises, discussed increasing activity slowly.  Follow-up again in 6 to 8 weeks

## 2024-04-03 ENCOUNTER — Other Ambulatory Visit: Payer: Self-pay | Admitting: Family Medicine

## 2024-05-01 NOTE — Progress Notes (Signed)
 [name] [address] [place] [address] Phone: [phone] Subjective:   Carrie [name], am serving as a scribe for Dr. Arthea [name].  I'm seeing this patient by the request  of:  Carrie [name] POUR, MD  CC: Neck and back pain follow-up, left knee pain follow-up  YEP:Dlagzrupcz  Carrie [name] is a 53 y.o. female coming in with complaint of back and neck pain. OMT 03/02/2024. Also f/u for L knee pain. Patient states last injection in knee lasted about 6 weeks. Hurting again today.  Medications patient has been prescribed: Gabapentin , Effexor   Taking:         Reviewed prior external information including notes and imaging from previsou exam, outside providers and external EMR if available.   As well as notes that were available from care everywhere and other healthcare systems.  Past medical history, social, surgical and family history all reviewed in electronic medical record.  No pertanent information unless stated regarding to the chief complaint.   Past Medical History:  Diagnosis Date   Allergy    Anemia    past hx low numbers with giving blood    Anxiety    Arthritis    hands    Depression    Endometriosis    Osteoporosis    slight    Thyroid  disease     No Known Allergies   Review of Systems:  No headache, visual changes, nausea, vomiting, diarrhea, constipation, dizziness, abdominal pain, skin rash, fevers, chills, night sweats, weight loss, swollen lymph nodes, body aches, joint swelling, chest pain, shortness of breath, mood changes. POSITIVE muscle aches  Objective  Blood pressure 124/84, pulse 87, height 5' 3 (1.6 m), weight 179 lb (81.2 kg), last menstrual period 03/13/2021, SpO2 98%.   General: No apparent distress alert and oriented x3 mood and affect normal, dressed appropriately.  HEENT: Pupils equal, extraocular movements intact  Respiratory: Patient's speak in full sentences and does not appear short of  breath  Cardiovascular: No lower extremity edema, non tender, no erythema  Gait MSK:  Back does have some loss lordosis noted.  Some tenderness to palpation of the paraspinal musculature.  Neck exam does have some limited sidebending bilaterally. Left knee does have some crepitus noted.  Trace effusion noted compared to the contralateral side.  Osteopathic findings  C2 flexed rotated and side bent right C6 flexed rotated and side bent left T3 extended rotated and side bent right inhaled rib T9 extended rotated and side bent left L2 flexed rotated and side bent right L3 flexed rotated and side bent left Sacrum right on right       Assessment and Plan:  Degenerative arthritis of left knee Looking into viscosupplementation for this individual.  Will see if we can get approval.  Has done relatively well over the course of the last year.  Will need to continue to monitor.  Follow-up again in 6 to 8 weeks otherwise.  Spondylolisthesis at L5-S1 level Discussed HEP  Discussed which activities could potentially cause some exacerbation.  No significant change in medications.  Has meloxicam  and Zanaflex .  Does respond well to osteopathic manipulation.  Follow-up again in 2 months for further evaluation and treatment.    Nonallopathic problems  Decision today to treat with OMT was based on Physical Exam  After verbal consent patient was treated with HVLA, ME, FPR techniques in cervical, rib, thoracic, lumbar, and sacral  areas  Patient tolerated the procedure well with improvement in  symptoms  Patient given exercises, stretches and lifestyle modifications  See medications in patient instructions if given  Patient will follow up in 4-8 weeks     The above documentation has been reviewed and is accurate and complete Carrie [name] M Carrie Hansell, DO         Note: This dictation was prepared with Dragon dictation along with smaller phrase technology. Any transcriptional errors that result  from this process are unintentional.

## 2024-05-02 ENCOUNTER — Other Ambulatory Visit: Payer: Self-pay | Admitting: Family Medicine

## 2024-05-11 ENCOUNTER — Encounter: Payer: Self-pay | Admitting: Family Medicine

## 2024-05-11 ENCOUNTER — Ambulatory Visit: Admitting: Family Medicine

## 2024-05-11 VITALS — BP 124/84 | HR 87 | Ht 63.0 in | Wt 179.0 lb

## 2024-05-11 DIAGNOSIS — M9903 Segmental and somatic dysfunction of lumbar region: Secondary | ICD-10-CM | POA: Diagnosis not present

## 2024-05-11 DIAGNOSIS — M9902 Segmental and somatic dysfunction of thoracic region: Secondary | ICD-10-CM

## 2024-05-11 DIAGNOSIS — M9904 Segmental and somatic dysfunction of sacral region: Secondary | ICD-10-CM | POA: Diagnosis not present

## 2024-05-11 DIAGNOSIS — M1712 Unilateral primary osteoarthritis, left knee: Secondary | ICD-10-CM

## 2024-05-11 DIAGNOSIS — M4317 Spondylolisthesis, lumbosacral region: Secondary | ICD-10-CM | POA: Diagnosis not present

## 2024-05-11 DIAGNOSIS — M9908 Segmental and somatic dysfunction of rib cage: Secondary | ICD-10-CM

## 2024-05-11 DIAGNOSIS — M9901 Segmental and somatic dysfunction of cervical region: Secondary | ICD-10-CM | POA: Diagnosis not present

## 2024-05-11 NOTE — Patient Instructions (Signed)
 Good to see you! Keep taking more of those pool breaks See you again in 8 weeks

## 2024-05-11 NOTE — Assessment & Plan Note (Signed)
 Discussed HEP  Discussed which activities could potentially cause some exacerbation.  No significant change in medications.  Has meloxicam  and Zanaflex .  Does respond well to osteopathic manipulation.  Follow-up again in 2 months for further evaluation and treatment.

## 2024-05-11 NOTE — Assessment & Plan Note (Signed)
 Looking into viscosupplementation for this individual.  Will see if we can get approval.  Has done relatively well over the course of the last year.  Will need to continue to monitor.  Follow-up again in 6 to 8 weeks otherwise.

## 2024-05-12 ENCOUNTER — Telehealth: Payer: Self-pay

## 2024-05-12 NOTE — Telephone Encounter (Signed)
 Patient ran for Synvisc via fax on 05/12/24. Waiting approval.

## 2024-05-12 NOTE — Telephone Encounter (Signed)
-----   Message from Carrie [name] sent at 05/11/2024 11:24 AM EDT ----- Regarding: GEl injection L knee gel approval please

## 2024-05-23 NOTE — Telephone Encounter (Signed)
 Submitted this through blue [employer] portal since the faxing way was not working. Pending case ID is [id number]

## 2024-05-24 ENCOUNTER — Encounter: Payer: Self-pay | Admitting: Family Medicine

## 2024-05-25 NOTE — Telephone Encounter (Signed)
 I mean Carrie [name]

## 2024-05-25 NOTE — Telephone Encounter (Signed)
 Called insurance to find out what the hold up is on the approval. The [employer] portal approval was canceled due to the existing authorization request via fax. Additional documentation was requested to be faxed to [phone] with Case #: [id number]. Information will be faxed. Approval still pending.

## 2024-05-25 NOTE — Telephone Encounter (Signed)
 For some reason it cancelled through the [employer] E portal stating updated care required? Not sure what that means. I do not have time to call and check up on it today.   I don't know if they have the faxed version and are working on that so they are cancelling the one online, but I have not received any fax or call about this patient.

## 2024-05-31 NOTE — Telephone Encounter (Signed)
 Synvisc was denied, started an appeal

## 2024-06-20 ENCOUNTER — Encounter: Payer: Self-pay | Admitting: Family Medicine

## 2024-06-21 ENCOUNTER — Telehealth: Payer: Self-pay

## 2024-06-21 NOTE — Telephone Encounter (Signed)
 Patient ran for Orthovisc on 06/21/24. Case ID: [id number]. Pending approval.

## 2024-06-22 NOTE — Telephone Encounter (Signed)
 PA is required. PA information sent to insurance. Pending approval.

## 2024-06-23 NOTE — Telephone Encounter (Signed)
 Message sent to patient.  First scheduled 9/5.

## 2024-06-23 NOTE — Telephone Encounter (Signed)
 Patient needs an appointment once medication is stocked.   Orthovisc approved for left knee. Deductible does not apply. Once the OOP has been met, patient is covered 100%. Only one copay per date of service. Prior Authorization for the drug is required through [place].   Fax received with approval from insurance. Reference number [id number].  Effective dates of authorization: 06/22/24 to 12/19/2024

## 2024-07-06 NOTE — Progress Notes (Unsigned)
 [name] [address] [place] [address] Phone: [phone] Subjective:   Carrie [name], am serving as a scribe for Dr. Arthea Claudene.  I'[name] seeing this patient by the request  of:  [name], Apolinar POUR, MD  CC: Hip pain, knee pain, Back pain  YEP:Dlagzrupcz  Carrie [name] is a 53 y.o. female coming in with complaint of back and neck pain. OMT 05/11/2024. Patient states that her back is the same as last visit.   L knee Orthovisc injection today. Near end of day she experiences antalgic gait. Wearing compression sleeve during the day.   Medications patient has been prescribed: Effexor , Gabapentin   Taking:         Reviewed prior external information including notes and imaging from previsou exam, outside providers and external EMR if available.   As well as notes that were available from care everywhere and other healthcare systems.  Past medical history, social, surgical and family history all reviewed in electronic medical record.  No pertanent information unless stated regarding to the chief complaint.   Past Medical History:  Diagnosis Date   Allergy    Anemia    past hx low numbers with giving blood    Anxiety    Arthritis    hands    Depression    Endometriosis    Osteoporosis    slight    Thyroid  disease     No Known Allergies   Review of Systems:  No headache, visual changes, nausea, vomiting, diarrhea, constipation, dizziness, abdominal pain, skin rash, fevers, chills, night sweats, weight loss, swollen lymph nodes, body aches, joint swelling, chest pain, shortness of breath, mood changes. POSITIVE muscle aches  Objective  Blood pressure 122/88, pulse 91, height 5' 3 (1.6 [name]), weight 179 lb (81.2 kg), last menstrual period 03/13/2021, SpO2 98%.   General: No apparent distress alert and oriented x3 mood and affect normal, dressed appropriately.  HEENT: Pupils equal, extraocular movements intact  Respiratory:  Patient's speak in full sentences and does not appear short of breath  Cardiovascular: No lower extremity edema, non tender, no erythema  Gait antalgic MSK:  Back does have some loss of lordosis noted.  Some tenderness to palpation in the paraspinal musculature.  Patient does have limited sidebending bilaterally.  Tightness with FABER left greater than right.  Left knee does have trace effusion noted.  Crepitus noted.  Instability with valgus and varus force.  Osteopathic findings  C2 flexed rotated and side bent right C6 flexed rotated and side bent left T3 extended rotated and side bent right inhaled rib T9 extended rotated and side bent left L2 flexed rotated and side bent right Sacrum right on right   After informed written and verbal consent, patient was seated on exam table. Left knee was prepped with alcohol swab and utilizing anterolateral approach, patient's left knee space was injected with after informed written and verbal consent, patient was seated on exam table. Left knee was prepped with alcohol swab and utilizing anterolateral approach, patient's left knee space was injected with15 mg/2.5 mL of Orthovisc(sodium hyaluronate) in a prefilled syringe was injected easily into the knee through a 22-gauge needle..Patient tolerated the procedure well without immediate complications.  Patient tolerated the procedure well without immediate complications.    Assessment and Plan:  Degenerative arthritis of left knee Patient given injection and tolerated the procedure well, has respondedWell to viscosupplementation in the past and hopeful again.  Discussed icing regimen patient has fairly severe  arthritic changes.  We will continue to monitor.  May need to consider the possibility of surgical intervention but at a young age we would like to avoid it.  Follow-up again in 1 week first second in the series of 4 injections for viscosupplementation.  Spondylolisthesis at L5-S1 level History of  spondylolisthesis.  Has responded relatively well though to osteopathic manipulation.  After evaluation attempted again.  Discussed continuing to work on core strengthening, icing regimen.  Increase activity slowly.  Follow-up again in 6 to 8 weeks    Nonallopathic problems  Decision today to treat with OMT was based on Physical Exam  After verbal consent patient was treated with HVLA, ME, FPR techniques in cervical, rib, thoracic, lumbar, and sacral  areas  Patient tolerated the procedure well with improvement in symptoms  Patient given exercises, stretches and lifestyle modifications  See medications in patient instructions if given  Patient will follow up in 4-8 weeks     The above documentation has been reviewed and is accurate and complete Carrie [name] Carrie Rappleye, DO         Note: This dictation was prepared with Dragon dictation along with smaller phrase technology. Any transcriptional errors that result from this process are unintentional.

## 2024-07-07 ENCOUNTER — Encounter: Payer: Self-pay | Admitting: Family Medicine

## 2024-07-07 ENCOUNTER — Ambulatory Visit: Admitting: Family Medicine

## 2024-07-07 VITALS — BP 122/88 | HR 91 | Ht 63.0 in | Wt 179.0 lb

## 2024-07-07 DIAGNOSIS — M9902 Segmental and somatic dysfunction of thoracic region: Secondary | ICD-10-CM

## 2024-07-07 DIAGNOSIS — M9904 Segmental and somatic dysfunction of sacral region: Secondary | ICD-10-CM

## 2024-07-07 DIAGNOSIS — M9903 Segmental and somatic dysfunction of lumbar region: Secondary | ICD-10-CM

## 2024-07-07 DIAGNOSIS — M9901 Segmental and somatic dysfunction of cervical region: Secondary | ICD-10-CM | POA: Diagnosis not present

## 2024-07-07 DIAGNOSIS — M4317 Spondylolisthesis, lumbosacral region: Secondary | ICD-10-CM

## 2024-07-07 DIAGNOSIS — M9908 Segmental and somatic dysfunction of rib cage: Secondary | ICD-10-CM

## 2024-07-07 DIAGNOSIS — M1712 Unilateral primary osteoarthritis, left knee: Secondary | ICD-10-CM

## 2024-07-07 MED ORDER — HYALURONAN 30 MG/2ML IX SOSY
30.0000 mg | PREFILLED_SYRINGE | Freq: Once | INTRA_ARTICULAR | Status: AC
  Administered 2024-07-07: 30 mg via INTRA_ARTICULAR

## 2024-07-07 NOTE — Assessment & Plan Note (Signed)
 History of spondylolisthesis.  Has responded relatively well though to osteopathic manipulation.  After evaluation attempted again.  Discussed continuing to work on core strengthening, icing regimen.  Increase activity slowly.  Follow-up again in 6 to 8 weeks

## 2024-07-07 NOTE — Patient Instructions (Signed)
 Good to see you. Orthovisc injection in left knee today. See you next week.

## 2024-07-07 NOTE — Assessment & Plan Note (Signed)
 Patient given injection and tolerated the procedure well, has respondedWell to viscosupplementation in the past and hopeful again.  Discussed icing regimen patient has fairly severe arthritic changes.  We will continue to monitor.  May need to consider the possibility of surgical intervention but at a young age we would like to avoid it.  Follow-up again in 1 week first second in the series of 4 injections for viscosupplementation.

## 2024-07-12 NOTE — Progress Notes (Unsigned)
 [name] [address] [place] [address] Phone: [phone] Subjective:   Carrie [name], am serving as a scribe for Dr. Arthea Claudene.  I'm seeing this patient by the request  of:  [name], Apolinar POUR, MD  CC: left knee pain   YEP:Dlagzrupcz  Carrie [name] is a 53 y.o. female coming in with complaint of L knee pain. Here for Orthovisc #2. Patient states no improvement yet      Past Medical History:  Diagnosis Date   Allergy    Anemia    past hx low numbers with giving blood    Anxiety    Arthritis    hands    Depression    Endometriosis    Osteoporosis    slight    Thyroid  disease    Past Surgical History:  Procedure Laterality Date   BREAST BIOPSY Left 09/10/2023   US  LT BREAST BX W LOC DEV 1ST LESION IMG BX SPEC US  GUIDE 09/10/2023 [facility]   TUBAL LIGATION     Social History   Socioeconomic History   Marital status: Married    Spouse name: Not on file   Number of children: Not on file   Years of education: Not on file   Highest education level: Not on file  Occupational History    Employer: [employer]  Tobacco Use   Smoking status: Former   Smokeless tobacco: Never  Vaping Use   Vaping status: Never Used  Substance and Sexual Activity   Alcohol use: Yes    Comment: daily    Drug use: Never   Sexual activity: Yes    Partners: Male    Birth control/protection: Pill  Other Topics Concern   Not on file  Social History Narrative   Right handed      Highest level of edu- 2 year college      One story home   Social Drivers of Health   Financial Resource Strain: Not on file  Food Insecurity: Not on file  Transportation Needs: Not on file  Physical Activity: Not on file  Stress: Not on file  Social Connections: Not on file   No Known Allergies Family History  Problem Relation Age of Onset   Diabetes Father    Renal Disease Father    Colon cancer Neg Hx    Colon polyps Neg Hx     Esophageal cancer Neg Hx    Rectal cancer Neg Hx    Stomach cancer Neg Hx     Current Outpatient Medications (Endocrine & Metabolic):    levothyroxine (SYNTHROID) 100 MCG tablet, Take 100 mcg by mouth daily.   Current Outpatient Medications (Respiratory):    loratadine (CLARITIN) 10 MG tablet, Take 10 mg by mouth daily.  Current Outpatient Medications (Analgesics):    meloxicam  (MOBIC ) 15 MG tablet, Take 1 tablet (15 mg total) by mouth daily.   Current Outpatient Medications (Other):    ALPRAZolam (XANAX) 0.25 MG tablet, Take 0.25 mg by mouth 3 (three) times daily as needed for anxiety.   Calcium Citrate-Vitamin D  (CALCIUM + D PO), Take by mouth 2 (two) times daily. Calcium 1,000 mg and Vit D 3 100 IU   gabapentin  (NEURONTIN ) 100 MG capsule, TAKE 1 CAPSULE BY MOUTH 2 TIMES DAILY   gabapentin  (NEURONTIN ) 300 MG capsule, TAKE 1 CAPSULE BY MOUTH AT BEDTIME   Magnesium ([employer]S TRIPLE MAGNESIUM COMPLEX) 400 MG CAPS, Take by mouth.   Multiple Vitamins-Minerals (CENTRUM PO),  Take by mouth. Centrum Mini's Multivitamin   OVER THE COUNTER MEDICATION, Natrol Complete balance Menopause relief BID- am and Pm   venlafaxine  XR (EFFEXOR -XR) 75 MG 24 hr capsule, TAKE 1 CAPSULE BY MOUTH ONCE DAILY WITH BREAKFAST   Melatonin 10 MG CAPS, Take 10 mg by mouth.   OVER THE COUNTER MEDICATION, CBD power to sleep 2 pills at bedtime   tiZANidine  (ZANAFLEX ) 4 MG tablet, Take 1 tablet (4 mg total) by mouth every 6 (six) hours as needed for muscle spasms.    Objective  Blood pressure 110/76, pulse 88, height 5' 3 (1.6 m), last menstrual period 03/13/2021, SpO2 97%.   General: No apparent distress alert and oriented x3 mood and affect normal, dressed appropriately.   After informed written and verbal consent, patient was seated on exam table. Left knee was prepped with alcohol swab and utilizing anterolateral approach, patient's left knee space was injected with15 mg/2.5 mL of Orthovisc(sodium hyaluronate) in a  prefilled syringe was injected easily into the knee through a 22-gauge needle..Patient tolerated the procedure well without immediate complications.   Impression and Recommendations:    The above documentation has been reviewed and is accurate and complete Deanndra Kirley M Haider Hornaday, DO

## 2024-07-13 ENCOUNTER — Ambulatory Visit: Admitting: Family Medicine

## 2024-07-13 ENCOUNTER — Encounter: Payer: Self-pay | Admitting: Family Medicine

## 2024-07-13 VITALS — BP 110/76 | HR 88 | Ht 63.0 in

## 2024-07-13 DIAGNOSIS — M1712 Unilateral primary osteoarthritis, left knee: Secondary | ICD-10-CM

## 2024-07-13 MED ORDER — HYALURONAN 30 MG/2ML IX SOSY
30.0000 mg | PREFILLED_SYRINGE | Freq: Once | INTRA_ARTICULAR | Status: AC
  Administered 2024-07-13: 30 mg via INTRA_ARTICULAR

## 2024-07-13 NOTE — Assessment & Plan Note (Signed)
 Second in the series of 4 injections on the knee.  Will return in 1 week for third in the series of 4.  Postinjection instruction given.

## 2024-07-20 NOTE — Progress Notes (Unsigned)
 [name] [address] [place] [address] Phone: [phone] Subjective:   [name], am serving as a scribe for Dr. Arthea Claudene.  I'm seeing this patient by the request  of:  [name], Apolinar POUR, MD  CC: left knee arthritis   YEP:Dlagzrupcz  Carrie [name] is a 53 y.o. female coming in with complaint of L knee pain. Orthovisc #3 today. Patient states      Past Medical History:  Diagnosis Date   Allergy    Anemia    past hx low numbers with giving blood    Anxiety    Arthritis    hands    Depression    Endometriosis    Osteoporosis    slight    Thyroid  disease    Past Surgical History:  Procedure Laterality Date   BREAST BIOPSY Left 09/10/2023   US  LT BREAST BX W LOC DEV 1ST LESION IMG BX SPEC US  GUIDE 09/10/2023 [facility]   TUBAL LIGATION     Social History   Socioeconomic History   Marital status: Married    Spouse name: Not on file   Number of children: Not on file   Years of education: Not on file   Highest education level: Not on file  Occupational History    Employer: [employer]  Tobacco Use   Smoking status: Former   Smokeless tobacco: Never  Vaping Use   Vaping status: Never Used  Substance and Sexual Activity   Alcohol use: Yes    Comment: daily    Drug use: Never   Sexual activity: Yes    Partners: Male    Birth control/protection: Pill  Other Topics Concern   Not on file  Social History Narrative   Right handed      Highest level of edu- 2 year college      One story home   Social Drivers of Health   Financial Resource Strain: Not on file  Food Insecurity: Not on file  Transportation Needs: Not on file  Physical Activity: Not on file  Stress: Not on file  Social Connections: Not on file   No Known Allergies Family History  Problem Relation Age of Onset   Diabetes Father    Renal Disease Father    Colon cancer Neg Hx    Colon polyps Neg Hx    Esophageal cancer  Neg Hx    Rectal cancer Neg Hx    Stomach cancer Neg Hx     Current Outpatient Medications (Endocrine & Metabolic):    levothyroxine (SYNTHROID) 100 MCG tablet, Take 100 mcg by mouth daily.   Current Outpatient Medications (Respiratory):    loratadine (CLARITIN) 10 MG tablet, Take 10 mg by mouth daily.  Current Outpatient Medications (Analgesics):    meloxicam  (MOBIC ) 15 MG tablet, Take 1 tablet (15 mg total) by mouth daily.   Current Outpatient Medications (Other):    ALPRAZolam (XANAX) 0.25 MG tablet, Take 0.25 mg by mouth 3 (three) times daily as needed for anxiety.   Calcium Citrate-Vitamin D  (CALCIUM + D PO), Take by mouth 2 (two) times daily. Calcium 1,000 mg and Vit D 3 100 IU   gabapentin  (NEURONTIN ) 100 MG capsule, TAKE 1 CAPSULE BY MOUTH 2 TIMES DAILY   gabapentin  (NEURONTIN ) 300 MG capsule, TAKE 1 CAPSULE BY MOUTH AT BEDTIME   Magnesium ([employer]S TRIPLE MAGNESIUM COMPLEX) 400 MG CAPS, Take by mouth.   Melatonin 10 MG CAPS, Take 10 mg by  mouth.   Multiple Vitamins-Minerals (CENTRUM PO), Take by mouth. Centrum Mini's Multivitamin   OVER THE COUNTER MEDICATION, CBD power to sleep 2 pills at bedtime   OVER THE COUNTER MEDICATION, Natrol Complete balance Menopause relief BID- am and Pm   tiZANidine  (ZANAFLEX ) 4 MG tablet, Take 1 tablet (4 mg total) by mouth every 6 (six) hours as needed for muscle spasms.   venlafaxine  XR (EFFEXOR -XR) 75 MG 24 hr capsule, TAKE 1 CAPSULE BY MOUTH ONCE DAILY WITH BREAKFAST    Objective  Blood pressure 116/82, pulse 90, height 5' 3 (1.6 m), weight 179 lb (81.2 kg), last menstrual period 03/13/2021, SpO2 99%.   General: No apparent distress alert and oriented x3 mood and affect normal, dressed appropriately.    After informed written and verbal consent, patient was seated on exam table. Left knee was prepped with alcohol swab and utilizing anterolateral approach, patient's left knee space was injected with15 mg/2.5 mL of Orthovisc(sodium  hyaluronate) in a prefilled syringe was injected easily into the knee through a 22-gauge needle..Patient tolerated the procedure well without immediate complications.   Impression and Recommendations:    The above documentation has been reviewed and is accurate and complete [name] M [name], DO

## 2024-07-21 ENCOUNTER — Ambulatory Visit: Admitting: Family Medicine

## 2024-07-21 ENCOUNTER — Other Ambulatory Visit: Payer: Self-pay | Admitting: Family

## 2024-07-21 VITALS — BP 116/82 | HR 90 | Ht 63.0 in | Wt 179.0 lb

## 2024-07-21 DIAGNOSIS — M1712 Unilateral primary osteoarthritis, left knee: Secondary | ICD-10-CM

## 2024-07-21 DIAGNOSIS — Z1231 Encounter for screening mammogram for malignant neoplasm of breast: Secondary | ICD-10-CM

## 2024-07-21 MED ORDER — HYALURONAN 30 MG/2ML IX SOSY
30.0000 mg | PREFILLED_SYRINGE | Freq: Once | INTRA_ARTICULAR | Status: AC
  Administered 2024-07-21: 30 mg via INTRA_ARTICULAR

## 2024-07-21 NOTE — Patient Instructions (Signed)
 Injection in knee today

## 2024-07-21 NOTE — Assessment & Plan Note (Signed)
 Third in a series of 4 injections given, return in 1 week

## 2024-07-26 NOTE — Progress Notes (Unsigned)
 [name] [address] [place] [address] Phone: [phone] Subjective:   Carrie [name], am serving as a scribe for Dr. Arthea Claudene.  I'm seeing this patient by the request  of:  [name], Apolinar POUR, MD  CC: Left knee pain  YEP:Dlagzrupcz  Carrie [name] is a 53 y.o. female coming in with complaint of L knee. Here for 4th Orthovisc injection. Patient states that she is noticing improvement. Unable to stand or sit for prolonged periods. If she moves she feels better. Pain has diminished.        Past Medical History:  Diagnosis Date   Allergy    Anemia    past hx low numbers with giving blood    Anxiety    Arthritis    hands    Depression    Endometriosis    Osteoporosis    slight    Thyroid  disease    Past Surgical History:  Procedure Laterality Date   BREAST BIOPSY Left 09/10/2023   US  LT BREAST BX W LOC DEV 1ST LESION IMG BX SPEC US  GUIDE 09/10/2023 [facility]   TUBAL LIGATION     Social History   Socioeconomic History   Marital status: Married    Spouse name: Not on file   Number of children: Not on file   Years of education: Not on file   Highest education level: Not on file  Occupational History    Employer: [employer]  Tobacco Use   Smoking status: Former   Smokeless tobacco: Never  Vaping Use   Vaping status: Never Used  Substance and Sexual Activity   Alcohol use: Yes    Comment: daily    Drug use: Never   Sexual activity: Yes    Partners: Male    Birth control/protection: Pill  Other Topics Concern   Not on file  Social History Narrative   Right handed      Highest level of edu- 2 year college      One story home   Social Drivers of Health   Financial Resource Strain: Not on file  Food Insecurity: Not on file  Transportation Needs: Not on file  Physical Activity: Not on file  Stress: Not on file  Social Connections: Not on file   No Known Allergies Family History   Problem Relation Age of Onset   Diabetes Father    Renal Disease Father    Colon cancer Neg Hx    Colon polyps Neg Hx    Esophageal cancer Neg Hx    Rectal cancer Neg Hx    Stomach cancer Neg Hx     Current Outpatient Medications (Endocrine & Metabolic):    levothyroxine (SYNTHROID) 100 MCG tablet, Take 100 mcg by mouth daily.   Current Outpatient Medications (Respiratory):    loratadine (CLARITIN) 10 MG tablet, Take 10 mg by mouth daily.  Current Outpatient Medications (Analgesics):    meloxicam  (MOBIC ) 15 MG tablet, Take 1 tablet (15 mg total) by mouth daily.   Current Outpatient Medications (Other):    ALPRAZolam (XANAX) 0.25 MG tablet, Take 0.25 mg by mouth 3 (three) times daily as needed for anxiety.   Calcium Citrate-Vitamin D  (CALCIUM + D PO), Take by mouth 2 (two) times daily. Calcium 1,000 mg and Vit D 3 100 IU   gabapentin  (NEURONTIN ) 100 MG capsule, TAKE 1 CAPSULE BY MOUTH 2 TIMES DAILY   gabapentin  (NEURONTIN ) 300 MG capsule, TAKE 1 CAPSULE BY MOUTH AT  BEDTIME   Magnesium (CVS TRIPLE MAGNESIUM COMPLEX) 400 MG CAPS, Take by mouth.   Melatonin 10 MG CAPS, Take 10 mg by mouth.   Multiple Vitamins-Minerals (CENTRUM PO), Take by mouth. Centrum Mini's Multivitamin   OVER THE COUNTER MEDICATION, CBD power to sleep 2 pills at bedtime   OVER THE COUNTER MEDICATION, Natrol Complete balance Menopause relief BID- am and Pm   tiZANidine  (ZANAFLEX ) 4 MG tablet, Take 1 tablet (4 mg total) by mouth every 6 (six) hours as needed for muscle spasms.   venlafaxine  XR (EFFEXOR -XR) 75 MG 24 hr capsule, TAKE 1 CAPSULE BY MOUTH ONCE DAILY WITH BREAKFAST   Objective  Last menstrual period 03/13/2021.   General: No apparent distress alert and oriented x3 mood and affect normal, dressed appropriately.   After informed written and verbal consent, patient was seated on exam table. Left knee was prepped with alcohol swab and utilizing anterolateral approach, patient's left knee space was  injected with15 mg/2.5 mL of Orthovisc(sodium hyaluronate) in a prefilled syringe was injected easily into the knee through a 22-gauge needle..Patient tolerated the procedure well without immediate complications.     Impression and Recommendations:    The above documentation has been reviewed and is accurate and complete Maedell Hedger M Meigan Pates, DO

## 2024-07-27 ENCOUNTER — Encounter: Payer: Self-pay | Admitting: Family Medicine

## 2024-07-27 ENCOUNTER — Ambulatory Visit: Admitting: Family Medicine

## 2024-07-27 VITALS — BP 126/84 | HR 100 | Ht 63.0 in

## 2024-07-27 DIAGNOSIS — M1712 Unilateral primary osteoarthritis, left knee: Secondary | ICD-10-CM

## 2024-07-27 MED ORDER — HYALURONAN 30 MG/2ML IX SOSY
30.0000 mg | PREFILLED_SYRINGE | Freq: Once | INTRA_ARTICULAR | Status: AC
  Administered 2024-07-27: 30 mg via INTRA_ARTICULAR

## 2024-07-27 NOTE — Assessment & Plan Note (Signed)
 Fourth and final injection of the viscosupplementation given today.  Follow-up again in 1 to 2 months for further evaluation.  Still holding off on any type of surgical intervention if possible.

## 2024-07-31 ENCOUNTER — Other Ambulatory Visit: Payer: Self-pay | Admitting: Family Medicine

## 2024-08-16 NOTE — Progress Notes (Signed)
 [name] [address] [place] [address] Phone: [phone] Subjective:   Carrie [name], am serving as a scribe for Dr. Arthea Claudene.  I'm seeing this patient by the request  of:  [name], Apolinar POUR, MD  CC: Back pain, knee pain follow-up  YEP:Dlagzrupcz  07/27/2024 Fourth and final injection of the viscosupplementation given today.  Follow-up again in 1 to 2 months for further evaluation.  Still holding off on any type of surgical intervention if possible.     Updated 08/18/2024 Carrie [name] is a 53 y.o. female coming in with complaint of L knee pain, known for arthritic changes of the left knee and completed viscosupplementation.  Also having back pain.  Patient states that her knee is doing a lot better. Stiff in the mornings.   Back is the same as last visit.       Past Medical History:  Diagnosis Date   Allergy    Anemia    past hx low numbers with giving blood    Anxiety    Arthritis    hands    Depression    Endometriosis    Osteoporosis    slight    Thyroid  disease    Past Surgical History:  Procedure Laterality Date   BREAST BIOPSY Left 09/10/2023   US  LT BREAST BX W LOC DEV 1ST LESION IMG BX SPEC US  GUIDE 09/10/2023 [facility]   TUBAL LIGATION     Social History   Socioeconomic History   Marital status: Married    Spouse name: Not on file   Number of children: Not on file   Years of education: Not on file   Highest education level: Not on file  Occupational History    Employer: [employer]  Tobacco Use   Smoking status: Former   Smokeless tobacco: Never  Vaping Use   Vaping status: Never Used  Substance and Sexual Activity   Alcohol use: Yes    Comment: daily    Drug use: Never   Sexual activity: Yes    Partners: Male    Birth control/protection: Pill  Other Topics Concern   Not on file  Social History Narrative   Right handed      Highest level of edu- 2 year college       One story home   Social Drivers of Health   Financial Resource Strain: Not on file  Food Insecurity: Not on file  Transportation Needs: Not on file  Physical Activity: Not on file  Stress: Not on file  Social Connections: Not on file   No Known Allergies Family History  Problem Relation Age of Onset   Diabetes Father    Renal Disease Father    Colon cancer Neg Hx    Colon polyps Neg Hx    Esophageal cancer Neg Hx    Rectal cancer Neg Hx    Stomach cancer Neg Hx     Current Outpatient Medications (Endocrine & Metabolic):    levothyroxine (SYNTHROID) 100 MCG tablet, Take 100 mcg by mouth daily.   Current Outpatient Medications (Respiratory):    loratadine (CLARITIN) 10 MG tablet, Take 10 mg by mouth daily.  Current Outpatient Medications (Analgesics):    meloxicam  (MOBIC ) 15 MG tablet, Take 1 tablet (15 mg total) by mouth daily.   Current Outpatient Medications (Other):    ALPRAZolam (XANAX) 0.25 MG tablet, Take 0.25 mg by mouth 3 (three) times daily as needed for anxiety.  Calcium Citrate-Vitamin D  (CALCIUM + D PO), Take by mouth 2 (two) times daily. Calcium 1,000 mg and Vit D 3 100 IU   gabapentin  (NEURONTIN ) 100 MG capsule, TAKE 1 CAPSULE BY MOUTH 2 TIMES DAILY   gabapentin  (NEURONTIN ) 300 MG capsule, TAKE 1 CAPSULE BY MOUTH AT BEDTIME   Magnesium ([employer] TRIPLE MAGNESIUM COMPLEX) 400 MG CAPS, Take by mouth.   Multiple Vitamins-Minerals (CENTRUM PO), Take by mouth. Centrum Mini's Multivitamin   OVER THE COUNTER MEDICATION, Natrol Complete balance Menopause relief BID- am and Pm   venlafaxine  XR (EFFEXOR -XR) 75 MG 24 hr capsule, TAKE 1 CAPSULE BY MOUTH ONCE DAILY WITH BREAKFAST   Melatonin 10 MG CAPS, Take 10 mg by mouth.   OVER THE COUNTER MEDICATION, CBD power to sleep 2 pills at bedtime   tiZANidine  (ZANAFLEX ) 4 MG tablet, Take 1 tablet (4 mg total) by mouth every 6 (six) hours as needed for muscle spasms.   Reviewed prior external information including notes and  imaging from  primary care provider As well as notes that were available from care everywhere and other healthcare systems.  Past medical history, social, surgical and family history all reviewed in electronic medical record.  No pertanent information unless stated regarding to the chief complaint.   Review of Systems:  No headache, visual changes, nausea, vomiting, diarrhea, constipation, dizziness, abdominal pain, skin rash, fevers, chills, night sweats, weight loss, swollen lymph nodes, body aches, joint swelling, chest pain, shortness of breath, mood changes. POSITIVE muscle aches  Objective  Blood pressure 128/86, pulse 92, height 5' 3 (1.6 m), weight 179 lb (81.2 kg), last menstrual period 03/13/2021, SpO2 98%.   General: No apparent distress alert and oriented x3 mood and affect normal, dressed appropriately.  HEENT: Pupils equal, extraocular movements intact  Respiratory: Patient's speak in full sentences and does not appear short of breath  Cardiovascular: No lower extremity edema, non tender, no erythema  Arthritic changes of multiple joints.  No low back does have significant loss of lordosis noted.  Tightness with FABER left greater than right.  Osteopathic findings C2 flexed rotated and side bent right C4 flexed rotated and side bent left C6 flexed rotated and side bent left T3 extended rotated and side bent right inhaled third rib T9 extended rotated and side bent left L2 flexed rotated and side bent right Sacrum left on left     Impression and Recommendations:     Spondylolisthesis at L5-S1 level Patient unfortunately with her job has to do some more repetitive activity.  Discussed with patient medication regimen and home exercises, discussed which activities to do and which ones to avoid.  Patient is going continue to work on core strengthening.  No change in medications at this time.  Hopefully with patient's knee feeling better we will make differences as well.   Follow-up again in 6 to 8 weeks     Decision today to treat with OMT was based on Physical Exam  After verbal consent patient was treated with HVLA, ME, FPR techniques in cervical, thoracic, rib, lumbar and sacral areas, all areas are chronic   Patient tolerated the procedure well with improvement in symptoms  Patient given exercises, stretches and lifestyle modifications  See medications in patient instructions if given  Patient will follow up in 4-8 weeks  The above documentation has been reviewed and is accurate and complete Carrie Hetland M Jerryl Holzhauer, DO

## 2024-08-18 ENCOUNTER — Ambulatory Visit: Admitting: Family Medicine

## 2024-08-18 ENCOUNTER — Encounter: Payer: Self-pay | Admitting: Family Medicine

## 2024-08-18 VITALS — BP 128/86 | HR 92 | Ht 63.0 in | Wt 179.0 lb

## 2024-08-18 DIAGNOSIS — M9902 Segmental and somatic dysfunction of thoracic region: Secondary | ICD-10-CM

## 2024-08-18 DIAGNOSIS — M9903 Segmental and somatic dysfunction of lumbar region: Secondary | ICD-10-CM | POA: Diagnosis not present

## 2024-08-18 DIAGNOSIS — M9901 Segmental and somatic dysfunction of cervical region: Secondary | ICD-10-CM

## 2024-08-18 DIAGNOSIS — M9908 Segmental and somatic dysfunction of rib cage: Secondary | ICD-10-CM | POA: Diagnosis not present

## 2024-08-18 DIAGNOSIS — M4317 Spondylolisthesis, lumbosacral region: Secondary | ICD-10-CM

## 2024-08-18 DIAGNOSIS — M9904 Segmental and somatic dysfunction of sacral region: Secondary | ICD-10-CM

## 2024-08-18 NOTE — Assessment & Plan Note (Signed)
 Patient unfortunately with her job has to do some more repetitive activity.  Discussed with patient medication regimen and home exercises, discussed which activities to do and which ones to avoid.  Patient is going continue to work on core strengthening.  No change in medications at this time.  Hopefully with patient's knee feeling better we will make differences as well.  Follow-up again in 6 to 8 weeks

## 2024-08-18 NOTE — Patient Instructions (Signed)
 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

## 2024-08-25 ENCOUNTER — Ambulatory Visit
Admission: RE | Admit: 2024-08-25 | Discharge: 2024-08-25 | Disposition: A | Discharge status: Home / Self Care | Source: Ambulatory Visit | Attending: Family | Admitting: Family

## 2024-08-25 DIAGNOSIS — Z1231 Encounter for screening mammogram for malignant neoplasm of breast: Secondary | ICD-10-CM

## 2024-10-02 ENCOUNTER — Other Ambulatory Visit: Payer: Self-pay | Admitting: Family Medicine

## 2024-10-11 NOTE — Progress Notes (Signed)
 [name] [address] [place] [address] Phone: [phone] Subjective:   Carrie [name], am serving as a scribe for Dr. Arthea [name].  I'm seeing this patient by the request  of:  [name], Apolinar POUR, MD  CC: Low back pain, worsening headache  YEP:Dlagzrupcz  Carrie [name] is a 53 y.o. female coming in with complaint of back and neck pain. OMT 08/18/2024. Patient states same per usual. No new symptoms. Currently has a headache.  Medications patient has been prescribed: Gabapentin , Effexor   Taking: Yes         Reviewed prior external information including notes and imaging from previsou exam, outside providers and external EMR if available.   As well as notes that were available from care everywhere and other healthcare systems.  Past medical history, social, surgical and family history all reviewed in electronic medical record.  No pertanent information unless stated regarding to the chief complaint.   Past Medical History:  Diagnosis Date   Allergy    Anemia    past hx low numbers with giving blood    Anxiety    Arthritis    hands    Depression    Endometriosis    Osteoporosis    slight    Thyroid  disease     No Known Allergies   Review of Systems:  No headache, visual changes, nausea, vomiting, diarrhea, constipation, dizziness, abdominal pain, skin rash, fevers, chills, night sweats, weight loss, swollen lymph nodes, body aches, joint swelling, chest pain, shortness of breath, mood changes. POSITIVE muscle aches  Objective  Blood pressure 130/84, pulse 90, height 5' 3 (1.6 m), weight 172 lb (78 kg), last menstrual period 03/13/2021, SpO2 97%.   General: No apparent distress alert and oriented x3 mood and affect normal, dressed appropriately.  HEENT: Pupils equal, extraocular movements intact  Respiratory: Patient's speak in full sentences and does not appear short of breath  Cardiovascular: No lower extremity edema,  non tender, no erythema  Antalgic gait favoring the left knee.  Patient is tender to palpation though over the paraspinal musculature of the lumbar spine.  Significant stiffness noted noted in the neck.  Limited sidebending bilaterally.  Osteopathic findings  C3 flexed rotated and side bent right C7 flexed rotated and side bent left T3 extended rotated and side bent right inhaled rib T9 extended rotated and side bent left L1 flexed rotated and side bent right L3 flexed rotated and side bent left Sacrum right on right      Assessment and Plan:  Degenerative arthritis of left knee Stable at the moment, continue to monitor.  No change in treatment at the moment.  Cervicogenic headache Worsening cervicogenic headaches.  Given Toradol  and Depo-Medrol  injections today.  No attempted muscle energy.  Increase activity slowly.  Follow-up again in 6 to 12 weeks    Nonallopathic problems  Decision today to treat with OMT was based on Physical Exam  After verbal consent patient was treated with HVLA, ME, FPR techniques in cervical, rib, thoracic, lumbar, and sacral  areas  Patient tolerated the procedure well with improvement in symptoms  Patient given exercises, stretches and lifestyle modifications  See medications in patient instructions if given  Patient will follow up in 4-8 weeks    The above documentation has been reviewed and is accurate and complete Carrie CHRISTELLA Claudene, DO          Note: This dictation was prepared with Dragon dictation along with smaller phrase  technology. Any transcriptional errors that result from this process are unintentional.

## 2024-10-12 ENCOUNTER — Encounter: Payer: Self-pay | Admitting: Family Medicine

## 2024-10-12 ENCOUNTER — Ambulatory Visit: Admitting: Family Medicine

## 2024-10-12 VITALS — BP 130/84 | HR 90 | Ht 63.0 in | Wt 172.0 lb

## 2024-10-12 DIAGNOSIS — M9901 Segmental and somatic dysfunction of cervical region: Secondary | ICD-10-CM | POA: Diagnosis not present

## 2024-10-12 DIAGNOSIS — G4486 Cervicogenic headache: Secondary | ICD-10-CM

## 2024-10-12 DIAGNOSIS — M9903 Segmental and somatic dysfunction of lumbar region: Secondary | ICD-10-CM | POA: Diagnosis not present

## 2024-10-12 DIAGNOSIS — M9908 Segmental and somatic dysfunction of rib cage: Secondary | ICD-10-CM

## 2024-10-12 DIAGNOSIS — M9904 Segmental and somatic dysfunction of sacral region: Secondary | ICD-10-CM | POA: Diagnosis not present

## 2024-10-12 DIAGNOSIS — M9902 Segmental and somatic dysfunction of thoracic region: Secondary | ICD-10-CM

## 2024-10-12 DIAGNOSIS — M1712 Unilateral primary osteoarthritis, left knee: Secondary | ICD-10-CM | POA: Diagnosis not present

## 2024-10-12 MED ORDER — METHYLPREDNISOLONE ACETATE 40 MG/ML IJ SUSP
40.0000 mg | Freq: Once | INTRAMUSCULAR | Status: AC
  Administered 2024-10-12: 40 mg via INTRAMUSCULAR

## 2024-10-12 MED ORDER — KETOROLAC TROMETHAMINE 30 MG/ML IJ SOLN
30.0000 mg | Freq: Once | INTRAMUSCULAR | Status: AC
  Administered 2024-10-12: 30 mg via INTRAMUSCULAR

## 2024-10-12 NOTE — Assessment & Plan Note (Signed)
 Worsening cervicogenic headaches.  Given Toradol  and Depo-Medrol  injections today.  No attempted muscle energy.  Increase activity slowly.  Follow-up again in 6 to 12 weeks

## 2024-10-12 NOTE — Patient Instructions (Addendum)
 Cocktail injection. No Nsaids for 24 hrs. See you again in 2 months Happy Holidays

## 2024-10-12 NOTE — Assessment & Plan Note (Signed)
 Stable at the moment, continue to monitor.  No change in treatment at the moment.

## 2024-11-06 ENCOUNTER — Other Ambulatory Visit: Payer: Self-pay | Admitting: Family Medicine

## 2024-12-07 NOTE — Progress Notes (Unsigned)
 " [name] [address] [place] [address] Phone: [phone] Subjective:   [name], am serving as a scribe for Dr. Arthea Claudene.  I'm seeing this patient by the request  of:  [name], Apolinar POUR, MD  CC: Back and neck pain follow-up  YEP:Dlagzrupcz  MILIA WARTH is a 54 y.o. female coming in with complaint of back and neck pain. OMT 10/12/2024.  Patient states that her L side of lumbar spine has been painful after almost slipping at work. Pain radiates into the L glute. Pain is constant. Using back brace, ice, heat, and stretching.   Medications patient has been prescribed: Gabapentin , Effexor   Taking: Yes       Reviewed prior external information including notes and imaging from previsou exam, outside providers and external EMR if available.   As well as notes that were available from care everywhere and other healthcare systems.  Past medical history, social, surgical and family history all reviewed in electronic medical record.  No pertanent information unless stated regarding to the chief complaint.   Past Medical History:  Diagnosis Date   Allergy    Anemia    past hx low numbers with giving blood    Anxiety    Arthritis    hands    Depression    Endometriosis    Osteoporosis    slight    Thyroid  disease     Allergies[1]   Review of Systems:  No headache, visual changes, nausea, vomiting, diarrhea, constipation, dizziness, abdominal pain, skin rash, fevers, chills, night sweats, weight loss, swollen lymph nodes, body aches, joint swelling, chest pain, shortness of breath, mood changes. POSITIVE muscle aches  Objective  Blood pressure 122/88, pulse 91, height 5' 3 (1.6 m), last menstrual period 03/13/2021, SpO2 99%.   General: No apparent distress alert and oriented x3 mood and affect normal, dressed appropriately.  HEENT: Pupils equal, extraocular movements intact  Respiratory: Patient's speak in full  sentences and does not appear short of breath  Cardiovascular: No lower extremity edema, non tender, no erythema  Gait MSK:  Back does have some loss of lordosis noted.  Some tenderness to palpation in the paraspinal musculature.  Difficulty with flexion and extension noted.  More pain with any type of range of motion exercises today.  Osteopathic findings  C2 flexed rotated and side bent right C6 flexed rotated and side bent left T3 extended rotated and side bent right inhaled rib T9 extended rotated and side bent left L2 flexed rotated and side bent right L3 flexed rotated and side bent left L5 flexed rotated and side bent left Sacrum right on right     Assessment and Plan:  Spondylolisthesis at L5-S1 level Increasing discomfort of the back.  Discussed icing regimen and home exercises, discussed which activities do and which ones to avoid.  Increase activity slowly.  Discussed icing regimen.  Follow-up again in 6 to 12 weeks Toradol  and Depo-Medrol  given secondary to the exacerbation of the pain.  Still wants to avoid another epidural if possible.    Nonallopathic problems  Decision today to treat with OMT was based on Physical Exam  After verbal consent patient was treated with HVLA, ME, FPR techniques in cervical, rib, thoracic, lumbar, and sacral  areas  Patient tolerated the procedure well with improvement in symptoms  Patient given exercises, stretches and lifestyle modifications  See medications in patient instructions if given  Patient will follow up in 6-12 weeks  The above documentation has been reviewed and is accurate and complete Fadumo Heng M Jalaiya Oyster, DO         Note: This dictation was prepared with Dragon dictation along with smaller phrase technology. Any transcriptional errors that result from this process are unintentional.            [1] No Known Allergies  "

## 2024-12-08 ENCOUNTER — Ambulatory Visit: Admitting: Family Medicine

## 2024-12-08 ENCOUNTER — Encounter: Payer: Self-pay | Admitting: Family Medicine

## 2024-12-08 VITALS — BP 122/88 | HR 91 | Ht 63.0 in

## 2024-12-08 DIAGNOSIS — M545 Low back pain, unspecified: Secondary | ICD-10-CM

## 2024-12-08 DIAGNOSIS — M9901 Segmental and somatic dysfunction of cervical region: Secondary | ICD-10-CM

## 2024-12-08 DIAGNOSIS — M9904 Segmental and somatic dysfunction of sacral region: Secondary | ICD-10-CM

## 2024-12-08 DIAGNOSIS — M9903 Segmental and somatic dysfunction of lumbar region: Secondary | ICD-10-CM

## 2024-12-08 DIAGNOSIS — M9908 Segmental and somatic dysfunction of rib cage: Secondary | ICD-10-CM

## 2024-12-08 DIAGNOSIS — M4317 Spondylolisthesis, lumbosacral region: Secondary | ICD-10-CM

## 2024-12-08 DIAGNOSIS — M9902 Segmental and somatic dysfunction of thoracic region: Secondary | ICD-10-CM

## 2024-12-08 MED ORDER — KETOROLAC TROMETHAMINE 60 MG/2ML IM SOLN
60.0000 mg | Freq: Once | INTRAMUSCULAR | Status: AC
  Administered 2024-12-08: 60 mg via INTRAMUSCULAR

## 2024-12-08 MED ORDER — METHYLPREDNISOLONE ACETATE 80 MG/ML IJ SUSP
80.0000 mg | Freq: Once | INTRAMUSCULAR | Status: AC
  Administered 2024-12-08: 80 mg via INTRAMUSCULAR

## 2024-12-08 NOTE — Patient Instructions (Addendum)
 Good to see you! Full cocktail injection in backside. No NSAIDs for 24 hours.  See you again in 2-3 months

## 2024-12-08 NOTE — Assessment & Plan Note (Signed)
 Increasing discomfort of the back.  Discussed icing regimen and home exercises, discussed which activities do and which ones to avoid.  Increase activity slowly.  Discussed icing regimen.  Follow-up again in 6 to 12 weeks Toradol  and Depo-Medrol  given secondary to the exacerbation of the pain.  Still wants to avoid another epidural if possible.

## 2025-02-16 ENCOUNTER — Ambulatory Visit: Admitting: Family Medicine
